# Patient Record
Sex: Male | Born: 1945
Health system: Southern US, Community
[De-identification: ages and names within clinical notes are randomized; demographics above are authoritative.]

## PROBLEM LIST (undated history)

## (undated) ENCOUNTER — Emergency Department (HOSPITAL_BASED_OUTPATIENT_CLINIC_OR_DEPARTMENT_OTHER): Admission: EM | Payer: BC Managed Care – PPO | Source: Home / Self Care

## (undated) DIAGNOSIS — Z9289 Personal history of other medical treatment: Secondary | ICD-10-CM

## (undated) DIAGNOSIS — R7303 Prediabetes: Secondary | ICD-10-CM

## (undated) DIAGNOSIS — M1990 Unspecified osteoarthritis, unspecified site: Secondary | ICD-10-CM

## (undated) DIAGNOSIS — K279 Peptic ulcer, site unspecified, unspecified as acute or chronic, without hemorrhage or perforation: Secondary | ICD-10-CM

## (undated) DIAGNOSIS — K219 Gastro-esophageal reflux disease without esophagitis: Secondary | ICD-10-CM

## (undated) DIAGNOSIS — I10 Essential (primary) hypertension: Secondary | ICD-10-CM

## (undated) DIAGNOSIS — J339 Nasal polyp, unspecified: Secondary | ICD-10-CM

## (undated) HISTORY — PX: COLONOSCOPY: SHX174

## (undated) HISTORY — PX: NASAL SINUS SURGERY: SHX719

## (undated) HISTORY — PX: STOMACH SURGERY: SHX791

## (undated) HISTORY — DX: Essential (primary) hypertension: I10

## (undated) HISTORY — PX: JOINT REPLACEMENT: SHX530

---

## 2012-07-07 ENCOUNTER — Encounter (HOSPITAL_BASED_OUTPATIENT_CLINIC_OR_DEPARTMENT_OTHER): Payer: Self-pay | Admitting: *Deleted

## 2012-07-07 NOTE — ED Notes (Signed)
Pt c/o diffuse abd pain with n/v x 5 days

## 2013-04-10 NOTE — Progress Notes (Signed)
Need orders in EPIC.  Surgery scheduled for 04/27/13.  Preop on 10/8 at 1130am Thank You.

## 2013-04-13 ENCOUNTER — Other Ambulatory Visit: Payer: Self-pay | Admitting: Orthopedic Surgery

## 2013-04-22 ENCOUNTER — Ambulatory Visit (HOSPITAL_COMMUNITY)
Admission: RE | Admit: 2013-04-22 | Discharge: 2013-04-22 | Disposition: A | Discharge status: Home / Self Care | Payer: BC Managed Care – PPO | Source: Ambulatory Visit | Attending: Orthopedic Surgery | Admitting: Orthopedic Surgery

## 2013-04-22 ENCOUNTER — Encounter (HOSPITAL_COMMUNITY): Payer: Self-pay | Admitting: Pharmacy Technician

## 2013-04-22 ENCOUNTER — Encounter (HOSPITAL_COMMUNITY)
Admission: RE | Admit: 2013-04-22 | Discharge: 2013-04-22 | Disposition: A | Discharge status: Home / Self Care | Payer: BC Managed Care – PPO | Source: Ambulatory Visit | Attending: Orthopedic Surgery | Admitting: Orthopedic Surgery

## 2013-04-22 ENCOUNTER — Encounter (HOSPITAL_COMMUNITY): Payer: Self-pay

## 2013-04-22 DIAGNOSIS — I10 Essential (primary) hypertension: Secondary | ICD-10-CM | POA: Insufficient documentation

## 2013-04-22 DIAGNOSIS — Z9289 Personal history of other medical treatment: Secondary | ICD-10-CM

## 2013-04-22 DIAGNOSIS — Z01812 Encounter for preprocedural laboratory examination: Secondary | ICD-10-CM | POA: Insufficient documentation

## 2013-04-22 DIAGNOSIS — Z01818 Encounter for other preprocedural examination: Secondary | ICD-10-CM | POA: Insufficient documentation

## 2013-04-22 DIAGNOSIS — M1710 Unilateral primary osteoarthritis, unspecified knee: Secondary | ICD-10-CM | POA: Insufficient documentation

## 2013-04-22 HISTORY — DX: Gastro-esophageal reflux disease without esophagitis: K21.9

## 2013-04-22 HISTORY — DX: Unspecified osteoarthritis, unspecified site: M19.90

## 2013-04-22 HISTORY — DX: Peptic ulcer, site unspecified, unspecified as acute or chronic, without hemorrhage or perforation: K27.9

## 2013-04-22 HISTORY — DX: Personal history of other medical treatment: Z92.89

## 2013-04-22 HISTORY — PX: CATARACT EXTRACTION: SUR2

## 2013-04-22 LAB — COMPREHENSIVE METABOLIC PANEL
AST: 20 U/L (ref 0–37)
Alkaline Phosphatase: 109 U/L (ref 39–117)
BUN: 9 mg/dL (ref 6–23)
CO2: 29 mEq/L (ref 19–32)
Calcium: 9.6 mg/dL (ref 8.4–10.5)
Chloride: 106 mEq/L (ref 96–112)
Creatinine, Ser: 0.87 mg/dL (ref 0.50–1.35)
GFR calc Af Amer: >90 mL/min (ref >90)
GFR calc non Af Amer: 87 mL/min — ABNORMAL LOW (ref >90)
Glucose, Bld: 91 mg/dL (ref 70–99)
Total Bilirubin: 0.7 mg/dL (ref 0.3–1.2)
Total Protein: 7.5 g/dL (ref 6.0–8.3)

## 2013-04-22 LAB — CBC
HCT: 43.5 % (ref 39.0–52.0)
Hemoglobin: 15.3 g/dL (ref 13.0–17.0)
MCHC: 35.2 g/dL (ref 30.0–36.0)
MCV: 96 fL (ref 78.0–100.0)
Platelets: 158 10*3/uL (ref 150–400)
RBC: 4.53 MIL/uL (ref 4.22–5.81)
WBC: 6.2 10*3/uL (ref 4.0–10.5)

## 2013-04-22 LAB — URINALYSIS, ROUTINE W REFLEX MICROSCOPIC
Glucose, UA: NEGATIVE mg/dL
Ketones, ur: NEGATIVE mg/dL
Leukocytes, UA: NEGATIVE
Urobilinogen, UA: 1 mg/dL (ref 0.0–1.0)
pH: 8 (ref 5.0–8.0)

## 2013-04-22 LAB — SURGICAL PCR SCREEN
MRSA, PCR: NEGATIVE
Staphylococcus aureus: POSITIVE — AB

## 2013-04-22 LAB — ABO/RH: ABO/RH(D): O POS

## 2013-04-22 LAB — PROTIME-INR: INR: 0.97 (ref 0.00–1.49)

## 2013-04-22 NOTE — Patient Instructions (Addendum)
[address] Tor Lonna Cobb  04/22/2013   Your procedure is scheduled on:   04-27-2013  Report to [facility] at      1200 noon..  Call this number if you have problems the morning of surgery: [phone]  Or Presurgical Testing [phone](Lorain Fettes)      Do not eat food:After Midnight.  May have clear liquids:up to 6 Hours before arrival. Nothing after : 0900 AM  Clear liquids include soda, tea, black coffee, apple or grape juice, broth.  Take these medicines the morning of surgery with A SIP OF WATER: Metoprolol. Omepraole. Tramadol   Do not wear jewelry, make-up or nail polish.  Do not wear lotions, powders, or perfumes. You may wear deodorant.  Do not shave 12 hours prior to first CHG shower(legs and under arms).(face and neck okay.)  Do not bring valuables to the hospital.  Contacts, dentures or bridgework,body piercing,  may not be worn into surgery.  Leave suitcase in the car. After surgery it may be brought to your room.  For patients admitted to the hospital, checkout time is 11:00 AM the day of discharge.   Patients discharged the day of surgery will not be allowed to drive home. Must have responsible person with you x 24 hours once discharged.  Name and phone number of your driver: Maryjane Hurter.spouse [phone] home  Special Instructions: CHG(Chlorhedine 4%-"Hibiclens","Betasept","Aplicare") Shower Use Special Wash: see special instructions.(avoid face and genitals)   Please read over the following fact sheets that you were given: MRSA Information, Blood Transfusion fact sheet, Incentive Spirometry Instruction.    Failure to follow these instructions may result in Cancellation of your surgery.   Patient signature_______________________________________________________

## 2013-04-22 NOTE — Pre-Procedure Instructions (Addendum)
04-22-13 EKG requested,pending fax copy from PCP. CXR done today. Wife-Mary Romero,acting interpreter today and day of surgery. Pt. Speaks and understands most English. 04-22-13 1630 EKG received dated 04-15-13 -report with chart. Cardiology note 04-20-13 with chart. W.Caeli Linehan,RN 04-23-13 1600 Pt's wife [name] aware of Positive PCR screen will need Mupirocin Oint.W. Kennon Portela

## 2013-04-23 NOTE — Progress Notes (Signed)
04-23-13 1600 Pt's spouse [name] notified of Positive Staph aureus PCR screen -pt. Will need Mupirocin Oint called to [facility]. Main [place] [phone].

## 2013-04-26 ENCOUNTER — Other Ambulatory Visit: Payer: Self-pay | Admitting: Orthopedic Surgery

## 2013-04-26 NOTE — H&P (Signed)
Zachary Lyons  DOB: 1945/09/25 Undefined / Language: Undefined / Race: Refused to Report/Unreported Male  Date of Admission:  04/27/2013  Chief Complaint:  Left Knee Pain  History of Present Illness The patient is a 67 year old male who comes in for a preoperative History and Physical. The patient is scheduled for a left total knee arthroplasty to be performed by Dr. Gus Lyons. Aluisio, MD at [facility] on 04/27/2013. The patient is a 67 year old male who presents with knee complaints. The patient was seen for a second opinion. The patient reports left knee (worse than right. He has not had any prior treatment for the right knee) symptoms including: pain which began 4 year(s) ago without any known injury.The patient feels that the symptoms are worsening. The patient has the current diagnosis of knee osteoarthritis. Prior to being seen today the patient was previously evaluated by a colleague. Previous work-up for this problem has included knee x-rays, knee MRI and arthroscopy (07/18/09- left knee arthroscopy with debridement of articular cartilage and medial meniscectomy, by Dr. Darnell Lyons). Current treatment includes knee brace (knee sleeves) and non-opioid analgesics (Tylenol. He is unable to take NSAIDs due to a history of stomach ulcers). Mr. Zachary Lyons has been treated by Dr. Christell Lyons at [facility] in [facility]. He has had a knee arthroscopy and had cortisone injections. Unfortunately, his knee is getting progressively worse over time. He is having pain mainly along the medial aspect of the left knee. He does get swelling at times. The knee gives out on him. He works 12-14 hours a day standing on a hard surface. By the end of the day everything gets worse. He is not currently having any right knee problem. He is ready to proceed with the left knee surgery. They have been treated conservatively in the past for the above stated problem and despite conservative measures,  they continue to have progressive pain and severe functional limitations and dysfunction. They have failed non-operative management including home exercise, medications, and injections. It is felt that they would benefit from undergoing total joint replacement. Risks and benefits of the procedure have been discussed with the patient and they elect to proceed with surgery. There are no active contraindications to surgery such as ongoing infection or rapidly progressive neurological disease.   Problem List Primary osteoarthritis of one knee (715.16)  Allergies No Known Drug Allergies. 01/30/2013  Family History Father. Deceased. age 94 Mother. Deceased. age 45  Social History Tobacco use. Never smoker. Alcohol use. Currently drinks alcohol. 2-3 drinks a day Marital status. Married. Children. 10 Living situation. Lives with spouse. Post-Surgical Plans. Plan is to go to rehab versus home.  Medication History Metoprolol Succinate ER (200MG  Tablet ER 24HR, Oral) Active. Lisinopril-Hydrochlorothiazide (20-25MG  Tablet, Oral) Active. Vitamin D (50000UNIT Capsule, Oral) Active. MiraLax ( Oral) Active. Centrum Silver Ultra Mens ( Oral) Active. Simvastatin ( Oral) Specific dose unknown - Active. TraMADol HCl (50MG  Tablet, Oral) Active. Tylenol Extra Strength (500MG  Tablet, Oral) Active.   Past Surgical History Sinus Surgery Colonoscopy. Twice EGD. Twice   Medical History Hypertension Gastric Ulcer. Peptic Ulcer Helicobacter pylori (H. pylori) infection (041.86) Hyperlipidemia Erectile dysfunction (607.84) Osteopenia (733.90) Seborrheic dermatitis of scalp (690.18) Vitamin D deficiency (268.9) Fatigue (780.79) Dizziness (780.4) GI bleed due to NSAIDs (578.9)   Review of Systems General:Not Present- Chills, Fever, Night Sweats, Fatigue, Weight Gain, Weight Loss and Memory Loss. Skin:Not Present- Hives, Itching, Rash, Eczema and Lesions. HEENT:Not  Present- Tinnitus, Headache, Double Vision, Visual Loss,  Hearing Loss and Dentures. Respiratory:Not Present- Shortness of breath with exertion, Shortness of breath at rest, Allergies, Coughing up blood and Chronic Cough. Cardiovascular:Not Present- Chest Pain, Racing/skipping heartbeats, Difficulty Breathing Lying Down, Murmur, Swelling and Palpitations. Gastrointestinal:Not Present- Bloody Stool, Heartburn, Abdominal Pain, Vomiting, Nausea, Constipation, Diarrhea, Difficulty Swallowing, Jaundice and Loss of appetitie. Male Genitourinary:Not Present- Urinary frequency, Blood in Urine, Weak urinary stream, Discharge, Flank Pain, Incontinence, Painful Urination, Urgency, Urinary Retention and Urinating at Night. Musculoskeletal:Present- Joint Pain. Not Present- Muscle Weakness, Muscle Pain, Joint Swelling, Back Pain, Morning Stiffness and Spasms. Neurological:Not Present- Tremor, Dizziness, Blackout spells, Paralysis, Difficulty with balance and Weakness. Psychiatric:Not Present- Insomnia.    Vitals Weight: 154 lb Height: 63 in Weight was reported by patient. Height was reported by patient. Body Surface Area: 1.76 m Body Mass Index: 27.28 kg/m BP: 148/94 (Sitting, Right Arm, Standard)     Physical Exam The physical exam findings are as follows:   General Mental Status - Alert, cooperative and good historian. General Appearance- pleasant. Not in acute distress. Orientation- Oriented X3. Build & Nutrition- Well nourished and Well developed.   Head and Neck Head- normocephalic, atraumatic . Neck Global Assessment- supple. no bruit auscultated on the right and no bruit auscultated on the left.   Eye Vision- Wears corrective lenses (readers). Pupil- Bilateral- Regular and Round. Motion- Bilateral- EOMI.   Chest and Lung Exam Auscultation: Breath sounds:- clear at anterior chest wall and - clear at posterior chest wall. Adventitious sounds:-  No Adventitious sounds.   Cardiovascular Auscultation:Rhythm- Regular rate and rhythm. Heart Sounds- S1 WNL and S2 WNL. Murmurs & Other Heart Sounds:Auscultation of the heart reveals - No Murmurs.   Abdomen Palpation/Percussion:Tenderness- Abdomen is non-tender to palpation. Rigidity (guarding)- Abdomen is soft. Auscultation:Auscultation of the abdomen reveals - Bowel sounds normal.   Male Genitourinary Not done, not pertinent to present illness  Musculoskeletal On examination he is alert and oriented. No apparent distress. His hips show a normal range of motion. No discomfort. The left knee varus deformity range is about 10 to 120. Marked crepitus on range of motion. There is tenderness, medial greater than lateral. No instability. The right knee shows no effusion. Slight varus. Range is 5-130. Mild crepitus on range of motion. No instability. Radiographs, AP of both knees and lateral show bone on bone arthritis in the medial patellofemoral compartments, both knees, left worse than right.   Assessment & Plan Primary osteoarthritis of one knee (715.16) Impression: Left Knee  Note: Plan is for a Left Total Knee Replacement by Dr. Lequita Halt.  Plan is to go home versus rehab following the hospital stay.  PCP - Dr. Liborio Nixon  The patient does not have any contraindications and will receive TXA (tranexamic acid) prior to surgery.  Signed electronically by Lauraine Rinne, III PA-C

## 2013-04-27 ENCOUNTER — Inpatient Hospital Stay (HOSPITAL_COMMUNITY)
Admission: RE | Admit: 2013-04-27 | Discharge: 2013-04-29 | DRG: 209 | Disposition: A | Discharge status: Home Health | Payer: BC Managed Care – PPO | Source: Ambulatory Visit | Attending: Orthopedic Surgery | Admitting: Orthopedic Surgery

## 2013-04-27 ENCOUNTER — Encounter (HOSPITAL_COMMUNITY): Payer: BC Managed Care – PPO | Admitting: Certified Registered Nurse Anesthetist

## 2013-04-27 ENCOUNTER — Encounter (HOSPITAL_COMMUNITY): Payer: Self-pay | Admitting: *Deleted

## 2013-04-27 ENCOUNTER — Encounter (HOSPITAL_COMMUNITY)
Admission: RE | Disposition: A | Discharge status: Home Health | Payer: Self-pay | Source: Ambulatory Visit | Attending: Orthopedic Surgery

## 2013-04-27 ENCOUNTER — Inpatient Hospital Stay (HOSPITAL_COMMUNITY): Payer: BC Managed Care – PPO | Admitting: Certified Registered Nurse Anesthetist

## 2013-04-27 DIAGNOSIS — I10 Essential (primary) hypertension: Secondary | ICD-10-CM | POA: Diagnosis present

## 2013-04-27 DIAGNOSIS — Z79899 Other long term (current) drug therapy: Secondary | ICD-10-CM

## 2013-04-27 DIAGNOSIS — M1710 Unilateral primary osteoarthritis, unspecified knee: Principal | ICD-10-CM | POA: Diagnosis present

## 2013-04-27 DIAGNOSIS — Z8711 Personal history of peptic ulcer disease: Secondary | ICD-10-CM

## 2013-04-27 DIAGNOSIS — K219 Gastro-esophageal reflux disease without esophagitis: Secondary | ICD-10-CM | POA: Diagnosis present

## 2013-04-27 DIAGNOSIS — M899 Disorder of bone, unspecified: Secondary | ICD-10-CM | POA: Diagnosis present

## 2013-04-27 DIAGNOSIS — E785 Hyperlipidemia, unspecified: Secondary | ICD-10-CM | POA: Diagnosis present

## 2013-04-27 DIAGNOSIS — M179 Osteoarthritis of knee, unspecified: Secondary | ICD-10-CM | POA: Diagnosis present

## 2013-04-27 DIAGNOSIS — Z96652 Presence of left artificial knee joint: Secondary | ICD-10-CM

## 2013-04-27 HISTORY — PX: TOTAL KNEE ARTHROPLASTY: SHX125

## 2013-04-27 LAB — TYPE AND SCREEN: ABO/RH(D): O POS

## 2013-04-27 SURGERY — ARTHROPLASTY, KNEE, TOTAL
Anesthesia: Spinal | Site: Knee | Laterality: Left | Wound class: Clean

## 2013-04-27 MED ORDER — BUPIVACAINE IN DEXTROSE 0.75-8.25 % IT SOLN
INTRATHECAL | Status: DC | PRN
  Administered 2013-04-27: 2 mL via INTRATHECAL

## 2013-04-27 MED ORDER — FLEET ENEMA 7-19 GM/118ML RE ENEM: 1.0000 | ENEMA | Freq: Once | RECTAL | Status: AC | PRN

## 2013-04-27 MED ORDER — CEFAZOLIN SODIUM-DEXTROSE 2-3 GM-% IV SOLR
INTRAVENOUS | Status: AC
  Filled 2013-04-27: qty 50

## 2013-04-27 MED ORDER — TRAMADOL HCL 50 MG PO TABS: 50.0000 mg | ORAL_TABLET | Freq: Four times a day (QID) | ORAL | Status: DC | PRN

## 2013-04-27 MED ORDER — SODIUM CHLORIDE 0.9 % IR SOLN
Status: DC | PRN
  Administered 2013-04-27: 1000 mL

## 2013-04-27 MED ORDER — DIPHENHYDRAMINE HCL 12.5 MG/5ML PO ELIX: 12.5000 mg | ORAL_SOLUTION | ORAL | Status: DC | PRN

## 2013-04-27 MED ORDER — CHLORHEXIDINE GLUCONATE 4 % EX LIQD: 60.0000 mL | Freq: Once | CUTANEOUS | Status: DC

## 2013-04-27 MED ORDER — ONDANSETRON HCL 4 MG/2ML IJ SOLN: 4.0000 mg | Freq: Four times a day (QID) | INTRAMUSCULAR | Status: DC | PRN

## 2013-04-27 MED ORDER — TRANEXAMIC ACID 100 MG/ML IV SOLN
1000.0000 mg | INTRAVENOUS | Status: AC
  Administered 2013-04-27: 1000 mg via INTRAVENOUS
  Filled 2013-04-27: qty 10

## 2013-04-27 MED ORDER — LACTATED RINGERS IV SOLN
INTRAVENOUS | Status: DC | PRN
  Administered 2013-04-27 (×2): via INTRAVENOUS

## 2013-04-27 MED ORDER — DEXAMETHASONE SODIUM PHOSPHATE 10 MG/ML IJ SOLN
10.0000 mg | Freq: Every day | INTRAMUSCULAR | Status: AC
  Filled 2013-04-27: qty 1

## 2013-04-27 MED ORDER — PROPOFOL INFUSION 10 MG/ML OPTIME
INTRAVENOUS | Status: DC | PRN
  Administered 2013-04-27: 80 ug/kg/min via INTRAVENOUS

## 2013-04-27 MED ORDER — CEFAZOLIN SODIUM-DEXTROSE 2-3 GM-% IV SOLR
2.0000 g | INTRAVENOUS | Status: AC
  Administered 2013-04-27: 2 g via INTRAVENOUS

## 2013-04-27 MED ORDER — MUPIROCIN 2 % EX OINT
TOPICAL_OINTMENT | CUTANEOUS | Status: AC
  Administered 2013-04-27: 1
  Filled 2013-04-27: qty 22

## 2013-04-27 MED ORDER — DEXAMETHASONE 6 MG PO TABS
10.0000 mg | ORAL_TABLET | Freq: Every day | ORAL | Status: AC
  Administered 2013-04-28: 10:00:00 10 mg via ORAL
  Filled 2013-04-27: qty 1

## 2013-04-27 MED ORDER — BISACODYL 10 MG RE SUPP: 10.0000 mg | Freq: Every day | RECTAL | Status: DC | PRN

## 2013-04-27 MED ORDER — SODIUM CHLORIDE 0.9 % IJ SOLN
INTRAMUSCULAR | Status: AC
  Filled 2013-04-27: qty 50

## 2013-04-27 MED ORDER — SODIUM CHLORIDE 0.9 % IJ SOLN
INTRAMUSCULAR | Status: DC | PRN
  Administered 2013-04-27: 30 mL via INTRAVENOUS

## 2013-04-27 MED ORDER — ACETAMINOPHEN 500 MG PO TABS
1000.0000 mg | ORAL_TABLET | Freq: Once | ORAL | Status: AC
  Administered 2013-04-27: 1000 mg via ORAL
  Filled 2013-04-27: qty 2

## 2013-04-27 MED ORDER — KETOROLAC TROMETHAMINE 15 MG/ML IJ SOLN: 7.5000 mg | Freq: Four times a day (QID) | INTRAMUSCULAR | Status: AC | PRN

## 2013-04-27 MED ORDER — BUPIVACAINE HCL (PF) 0.25 % IJ SOLN
INTRAMUSCULAR | Status: AC
  Filled 2013-04-27: qty 30

## 2013-04-27 MED ORDER — PHENOL 1.4 % MT LIQD: 1.0000 | OROMUCOSAL | Status: DC | PRN

## 2013-04-27 MED ORDER — ONDANSETRON HCL 4 MG PO TABS: 4.0000 mg | ORAL_TABLET | Freq: Four times a day (QID) | ORAL | Status: DC | PRN

## 2013-04-27 MED ORDER — LOSARTAN POTASSIUM 50 MG PO TABS
50.0000 mg | ORAL_TABLET | Freq: Two times a day (BID) | ORAL | Status: DC
  Administered 2013-04-27 – 2013-04-29 (×4): 50 mg via ORAL
  Filled 2013-04-27 (×5): qty 1

## 2013-04-27 MED ORDER — LACTATED RINGERS IV SOLN: INTRAVENOUS | Status: DC

## 2013-04-27 MED ORDER — MENTHOL 3 MG MT LOZG
1.0000 | LOZENGE | OROMUCOSAL | Status: DC | PRN
  Filled 2013-04-27: qty 9

## 2013-04-27 MED ORDER — HYDROMORPHONE HCL PF 1 MG/ML IJ SOLN: 0.2500 mg | INTRAMUSCULAR | Status: DC | PRN

## 2013-04-27 MED ORDER — PROMETHAZINE HCL 25 MG/ML IJ SOLN: 6.2500 mg | INTRAMUSCULAR | Status: DC | PRN

## 2013-04-27 MED ORDER — POLYETHYLENE GLYCOL 3350 17 G PO PACK: 17.0000 g | PACK | Freq: Every day | ORAL | Status: DC | PRN

## 2013-04-27 MED ORDER — ONDANSETRON HCL 4 MG/2ML IJ SOLN
INTRAMUSCULAR | Status: DC | PRN
  Administered 2013-04-27 (×2): 2 mg via INTRAMUSCULAR

## 2013-04-27 MED ORDER — ACETAMINOPHEN 500 MG PO TABS
1000.0000 mg | ORAL_TABLET | Freq: Four times a day (QID) | ORAL | Status: AC
  Administered 2013-04-27 – 2013-04-28 (×3): 1000 mg via ORAL
  Filled 2013-04-27 (×4): qty 2

## 2013-04-27 MED ORDER — BUPIVACAINE LIPOSOME 1.3 % IJ SUSP
20.0000 mL | Freq: Once | INTRAMUSCULAR | Status: DC
  Filled 2013-04-27: qty 20

## 2013-04-27 MED ORDER — BUPIVACAINE HCL 0.25 % IJ SOLN
INTRAMUSCULAR | Status: DC | PRN
  Administered 2013-04-27: 20 mL

## 2013-04-27 MED ORDER — METOPROLOL SUCCINATE ER 100 MG PO TB24
300.0000 mg | ORAL_TABLET | Freq: Every morning | ORAL | Status: DC
  Administered 2013-04-28 – 2013-04-29 (×2): 300 mg via ORAL
  Filled 2013-04-27 (×3): qty 3

## 2013-04-27 MED ORDER — METOCLOPRAMIDE HCL 5 MG/ML IJ SOLN: 5.0000 mg | Freq: Three times a day (TID) | INTRAMUSCULAR | Status: DC | PRN

## 2013-04-27 MED ORDER — RIVAROXABAN 10 MG PO TABS
10.0000 mg | ORAL_TABLET | Freq: Every day | ORAL | Status: DC
  Administered 2013-04-28 – 2013-04-29 (×2): 10 mg via ORAL
  Filled 2013-04-27 (×3): qty 1

## 2013-04-27 MED ORDER — SODIUM CHLORIDE 0.9 % IV SOLN: INTRAVENOUS | Status: DC

## 2013-04-27 MED ORDER — DEXAMETHASONE SODIUM PHOSPHATE 10 MG/ML IJ SOLN: 10.0000 mg | Freq: Once | INTRAMUSCULAR | Status: DC

## 2013-04-27 MED ORDER — MUPIROCIN CALCIUM 2 % EX CREA
TOPICAL_CREAM | Freq: Two times a day (BID) | CUTANEOUS | Status: DC
  Filled 2013-04-27: qty 15

## 2013-04-27 MED ORDER — OXYCODONE HCL 5 MG PO TABS
5.0000 mg | ORAL_TABLET | ORAL | Status: DC | PRN
  Administered 2013-04-27 – 2013-04-28 (×2): 10 mg via ORAL
  Administered 2013-04-28: 5 mg via ORAL
  Administered 2013-04-28 – 2013-04-29 (×4): 10 mg via ORAL
  Filled 2013-04-27 (×2): qty 2
  Filled 2013-04-27: qty 1
  Filled 2013-04-27 (×4): qty 2

## 2013-04-27 MED ORDER — PANTOPRAZOLE SODIUM 40 MG PO TBEC
80.0000 mg | DELAYED_RELEASE_TABLET | Freq: Every day | ORAL | Status: DC
  Filled 2013-04-27: qty 2

## 2013-04-27 MED ORDER — DOCUSATE SODIUM 100 MG PO CAPS
100.0000 mg | ORAL_CAPSULE | Freq: Two times a day (BID) | ORAL | Status: DC
  Administered 2013-04-27 – 2013-04-29 (×4): 100 mg via ORAL

## 2013-04-27 MED ORDER — POTASSIUM CHLORIDE IN NACL 20-0.45 MEQ/L-% IV SOLN
INTRAVENOUS | Status: DC
  Administered 2013-04-27 – 2013-04-29 (×3): via INTRAVENOUS
  Filled 2013-04-27 (×4): qty 1000

## 2013-04-27 MED ORDER — METHOCARBAMOL 500 MG PO TABS
500.0000 mg | ORAL_TABLET | Freq: Four times a day (QID) | ORAL | Status: DC | PRN
  Administered 2013-04-27 – 2013-04-29 (×4): 500 mg via ORAL
  Filled 2013-04-27 (×5): qty 1

## 2013-04-27 MED ORDER — MIDAZOLAM HCL 5 MG/5ML IJ SOLN
INTRAMUSCULAR | Status: DC | PRN
  Administered 2013-04-27 (×2): 1 mg via INTRAVENOUS

## 2013-04-27 MED ORDER — 0.9 % SODIUM CHLORIDE (POUR BTL) OPTIME
TOPICAL | Status: DC | PRN
  Administered 2013-04-27: 1000 mL

## 2013-04-27 MED ORDER — BUPIVACAINE LIPOSOME 1.3 % IJ SUSP
INTRAMUSCULAR | Status: DC | PRN
  Administered 2013-04-27: 20 mL

## 2013-04-27 MED ORDER — METHOCARBAMOL 100 MG/ML IJ SOLN
500.0000 mg | Freq: Four times a day (QID) | INTRAVENOUS | Status: DC | PRN
  Filled 2013-04-27: qty 5

## 2013-04-27 MED ORDER — SIMVASTATIN 20 MG PO TABS
20.0000 mg | ORAL_TABLET | Freq: Every day | ORAL | Status: DC
  Administered 2013-04-27 – 2013-04-28 (×2): 20 mg via ORAL
  Filled 2013-04-27 (×3): qty 1

## 2013-04-27 MED ORDER — MORPHINE SULFATE 2 MG/ML IJ SOLN
1.0000 mg | INTRAMUSCULAR | Status: DC | PRN
  Administered 2013-04-27 – 2013-04-28 (×3): 2 mg via INTRAVENOUS
  Filled 2013-04-27 (×3): qty 1

## 2013-04-27 MED ORDER — CEFAZOLIN SODIUM 1-5 GM-% IV SOLN
1.0000 g | Freq: Four times a day (QID) | INTRAVENOUS | Status: AC
  Administered 2013-04-27 – 2013-04-28 (×2): 1 g via INTRAVENOUS
  Filled 2013-04-27 (×2): qty 50

## 2013-04-27 MED ORDER — METOCLOPRAMIDE HCL 10 MG PO TABS: 5.0000 mg | ORAL_TABLET | Freq: Three times a day (TID) | ORAL | Status: DC | PRN

## 2013-04-27 SURGICAL SUPPLY — 61 items
BAG ZIPLOCK 12X15 (MISCELLANEOUS) ×2 IMPLANT
BANDAGE ELASTIC 6 VELCRO ST LF (GAUZE/BANDAGES/DRESSINGS) ×2 IMPLANT
BANDAGE ESMARK 6X9 LF (GAUZE/BANDAGES/DRESSINGS) ×1 IMPLANT
BLADE SAG 18X100X1.27 (BLADE) ×2 IMPLANT
BLADE SAW SGTL 11.0X1.19X90.0M (BLADE) ×2 IMPLANT
BNDG ESMARK 6X9 LF (GAUZE/BANDAGES/DRESSINGS) ×2
BOWL SMART MIX CTS (DISPOSABLE) ×2 IMPLANT
CAPT RP KNEE ×2 IMPLANT
CEMENT HV SMART SET (Cement) ×4 IMPLANT
CLOTH BEACON ORANGE TIMEOUT ST (SAFETY) IMPLANT
CUFF TOURN SGL QUICK 34 (TOURNIQUET CUFF) ×1
CUFF TRNQT CYL 34X4X40X1 (TOURNIQUET CUFF) ×1 IMPLANT
DECANTER SPIKE VIAL GLASS SM (MISCELLANEOUS) ×2 IMPLANT
DRAPE EXTREMITY T 121X128X90 (DRAPE) ×2 IMPLANT
DRAPE POUCH INSTRU U-SHP 10X18 (DRAPES) ×2 IMPLANT
DRAPE U-SHAPE 47X51 STRL (DRAPES) ×2 IMPLANT
DRSG ADAPTIC 3X8 NADH LF (GAUZE/BANDAGES/DRESSINGS) ×2 IMPLANT
DRSG PAD ABDOMINAL 8X10 ST (GAUZE/BANDAGES/DRESSINGS) ×2 IMPLANT
DURAPREP 26ML APPLICATOR (WOUND CARE) ×2 IMPLANT
ELECT REM PT RETURN 9FT ADLT (ELECTROSURGICAL) ×2
ELECTRODE REM PT RTRN 9FT ADLT (ELECTROSURGICAL) ×1 IMPLANT
EVACUATOR 1/8 PVC DRAIN (DRAIN) ×2 IMPLANT
FACESHIELD LNG OPTICON STERILE (SAFETY) ×10 IMPLANT
GLOVE BIO SURGEON STRL SZ7.5 (GLOVE) IMPLANT
GLOVE BIO SURGEON STRL SZ8 (GLOVE) ×2 IMPLANT
GLOVE BIOGEL PI IND STRL 7.0 (GLOVE) ×1 IMPLANT
GLOVE BIOGEL PI IND STRL 8 (GLOVE) ×2 IMPLANT
GLOVE BIOGEL PI INDICATOR 7.0 (GLOVE) ×1
GLOVE BIOGEL PI INDICATOR 8 (GLOVE) ×2
GLOVE SURG SS PI 6.5 STRL IVOR (GLOVE) ×2 IMPLANT
GLOVE SURG SS PI 8.5 STRL IVOR (GLOVE) ×2
GLOVE SURG SS PI 8.5 STRL STRW (GLOVE) ×2 IMPLANT
GOWN PREVENTION PLUS LG XLONG (DISPOSABLE) ×2 IMPLANT
GOWN STRL REIN 2XL XLG LVL4 (GOWN DISPOSABLE) ×2 IMPLANT
GOWN STRL REIN XL XLG (GOWN DISPOSABLE) ×4 IMPLANT
HANDPIECE INTERPULSE COAX TIP (DISPOSABLE) ×1
IMMOBILIZER KNEE 20 (SOFTGOODS) ×2
IMMOBILIZER KNEE 20 THIGH 36 (SOFTGOODS) ×1 IMPLANT
KIT BASIN OR (CUSTOM PROCEDURE TRAY) ×2 IMPLANT
MANIFOLD NEPTUNE II (INSTRUMENTS) ×2 IMPLANT
NDL SAFETY ECLIPSE 18X1.5 (NEEDLE) ×2 IMPLANT
NEEDLE HYPO 18GX1.5 SHARP (NEEDLE) ×2
NS IRRIG 1000ML POUR BTL (IV SOLUTION) ×2 IMPLANT
PACK TOTAL JOINT (CUSTOM PROCEDURE TRAY) ×2 IMPLANT
PADDING CAST COTTON 6X4 STRL (CAST SUPPLIES) ×2 IMPLANT
POSITIONER SURGICAL ARM (MISCELLANEOUS) ×2 IMPLANT
SET HNDPC FAN SPRY TIP SCT (DISPOSABLE) ×1 IMPLANT
SPONGE GAUZE 4X4 12PLY (GAUZE/BANDAGES/DRESSINGS) ×2 IMPLANT
STRIP CLOSURE SKIN 1/2X4 (GAUZE/BANDAGES/DRESSINGS) ×2 IMPLANT
SUCTION FRAZIER 12FR DISP (SUCTIONS) ×2 IMPLANT
SUT MNCRL AB 4-0 PS2 18 (SUTURE) ×2 IMPLANT
SUT VIC AB 2-0 CT1 27 (SUTURE) ×3
SUT VIC AB 2-0 CT1 TAPERPNT 27 (SUTURE) ×3 IMPLANT
SUT VLOC 180 0 24IN GS25 (SUTURE) ×2 IMPLANT
SYR 20CC LL (SYRINGE) ×2 IMPLANT
SYR 50ML LL SCALE MARK (SYRINGE) ×2 IMPLANT
TOWEL OR 17X26 10 PK STRL BLUE (TOWEL DISPOSABLE) ×4 IMPLANT
TRAY FOLEY CATH 14FRSI W/METER (CATHETERS) IMPLANT
TRAY FOLEY METER SIL LF 16FR (CATHETERS) ×2 IMPLANT
WATER STERILE IRR 1500ML POUR (IV SOLUTION) ×2 IMPLANT
WRAP KNEE MAXI GEL POST OP (GAUZE/BANDAGES/DRESSINGS) ×2 IMPLANT

## 2013-04-27 NOTE — Transfer of Care (Signed)
Immediate Anesthesia Transfer of Care Note  Patient: Zachary Lyons  Procedure(s) Performed: Procedure(s): LEFT TOTAL KNEE ARTHROPLASTY (Left)  Patient Location: PACU  Anesthesia Type:Spinal  Level of Consciousness: awake, alert , oriented and patient cooperative  Airway & Oxygen Therapy: Patient Spontanous Breathing and Patient connected to face mask oxygen  Post-op Assessment: Report given to PACU RN and Post -op Vital signs reviewed and stable  Post vital signs: Reviewed and stable  Complications: No apparent anesthesia complications

## 2013-04-27 NOTE — Anesthesia Procedure Notes (Signed)
Spinal  Patient location during procedure: OR Start time: 04/27/2013 3:05 PM End time: 04/27/2013 3:10 PM Staffing Anesthesiologist: Lucille Passy F Performed by: anesthesiologist  Preanesthetic Checklist Completed: patient identified, site marked, surgical consent, pre-op evaluation, timeout performed, IV checked, risks and benefits discussed and monitors and equipment checked Spinal Block Patient position: sitting Prep: Betadine Patient monitoring: heart rate, continuous pulse ox and blood pressure Injection technique: single-shot Needle Needle type: Spinocan  Needle gauge: 22 G Needle length: 9 cm Additional Notes Expiration date of kit checked and confirmed. Patient tolerated procedure well, without complications. Negative heme/paresthesia Lot [id number] DOE 06/2014

## 2013-04-27 NOTE — H&P (View-Only) (Signed)
Zachary Lyons  DOB: 09/10/1945 Undefined / Language: Undefined / Race: Refused to Report/Unreported Male  Date of Admission:  04/27/2013  Chief Complaint:  Left Knee Pain  History of Present Illness The patient is a 67 year old male who comes in for a preoperative History and Physical. The patient is scheduled for a left total knee arthroplasty to be performed by Dr. Frank V. Aluisio, MD at [facility] on 04/27/2013. The patient is a 67 year old male who presents with knee complaints. The patient was seen for a second opinion. The patient reports left knee (worse than right. He has not had any prior treatment for the right knee) symptoms including: pain which began 4 year(s) ago without any known injury.The patient feels that the symptoms are worsening. The patient has the current diagnosis of knee osteoarthritis. Prior to being seen today the patient was previously evaluated by a colleague. Previous work-up for this problem has included knee x-rays, knee MRI and arthroscopy (07/18/09- left knee arthroscopy with debridement of articular cartilage and medial meniscectomy, by Dr. Slade Moore). Current treatment includes knee brace (knee sleeves) and non-opioid analgesics (Tylenol. He is unable to take NSAIDs due to a history of stomach ulcers). [name] has been treated by Dr. Moore at [facility] in [place]. He has had a knee arthroscopy and had cortisone injections. Unfortunately, his knee is getting progressively worse over time. He is having pain mainly along the medial aspect of the left knee. He does get swelling at times. The knee gives out on him. He works 12-14 hours a day standing on a hard surface. By the end of the day everything gets worse. He is not currently having any right knee problem. He is ready to proceed with the left knee surgery. They have been treated conservatively in the past for the above stated problem and despite conservative measures,  they continue to have progressive pain and severe functional limitations and dysfunction. They have failed non-operative management including home exercise, medications, and injections. It is felt that they would benefit from undergoing total joint replacement. Risks and benefits of the procedure have been discussed with the patient and they elect to proceed with surgery. There are no active contraindications to surgery such as ongoing infection or rapidly progressive neurological disease.   Problem List Primary osteoarthritis of one knee (715.16)  Allergies No Known Drug Allergies. 01/30/2013  Family History Father. Deceased. age 87 Mother. Deceased. age 7  Social History Tobacco use. Never smoker. Alcohol use. Currently drinks alcohol. 2-3 drinks a day Marital status. Married. Children. 10 Living situation. Lives with spouse. Post-Surgical Plans. Plan is to go to rehab versus home.  Medication History Metoprolol Succinate ER (200MG Tablet ER 24HR, Oral) Active. Lisinopril-Hydrochlorothiazide (20-25MG Tablet, Oral) Active. Vitamin D (50000UNIT Capsule, Oral) Active. MiraLax ( Oral) Active. Centrum Silver Ultra Mens ( Oral) Active. Simvastatin ( Oral) Specific dose unknown - Active. TraMADol HCl (50MG Tablet, Oral) Active. Tylenol Extra Strength (500MG Tablet, Oral) Active.   Past Surgical History Sinus Surgery Colonoscopy. Twice EGD. Twice   Medical History Hypertension Gastric Ulcer. Peptic Ulcer Helicobacter pylori (H. pylori) infection (041.86) Hyperlipidemia Erectile dysfunction (607.84) Osteopenia (733.90) Seborrheic dermatitis of scalp (690.18) Vitamin D deficiency (268.9) Fatigue (780.79) Dizziness (780.4) GI bleed due to NSAIDs (578.9)   Review of Systems General:Not Present- Chills, Fever, Night Sweats, Fatigue, Weight Gain, Weight Loss and Memory Loss. Skin:Not Present- Hives, Itching, Rash, Eczema and Lesions. HEENT:Not  Present- Tinnitus, Headache, Double Vision, Visual Loss,   Hearing Loss and Dentures. Respiratory:Not Present- Shortness of breath with exertion, Shortness of breath at rest, Allergies, Coughing up blood and Chronic Cough. Cardiovascular:Not Present- Chest Pain, Racing/skipping heartbeats, Difficulty Breathing Lying Down, Murmur, Swelling and Palpitations. Gastrointestinal:Not Present- Bloody Stool, Heartburn, Abdominal Pain, Vomiting, Nausea, Constipation, Diarrhea, Difficulty Swallowing, Jaundice and Loss of appetitie. Male Genitourinary:Not Present- Urinary frequency, Blood in Urine, Weak urinary stream, Discharge, Flank Pain, Incontinence, Painful Urination, Urgency, Urinary Retention and Urinating at Night. Musculoskeletal:Present- Joint Pain. Not Present- Muscle Weakness, Muscle Pain, Joint Swelling, Back Pain, Morning Stiffness and Spasms. Neurological:Not Present- Tremor, Dizziness, Blackout spells, Paralysis, Difficulty with balance and Weakness. Psychiatric:Not Present- Insomnia.    Vitals Weight: 154 lb Height: 63 in Weight was reported by patient. Height was reported by patient. Body Surface Area: 1.76 m Body Mass Index: 27.28 kg/m BP: 148/94 (Sitting, Right Arm, Standard)     Physical Exam The physical exam findings are as follows:   General Mental Status - Alert, cooperative and good historian. General Appearance- pleasant. Not in acute distress. Orientation- Oriented X3. Build & Nutrition- Well nourished and Well developed.   Head and Neck Head- normocephalic, atraumatic . Neck Global Assessment- supple. no bruit auscultated on the right and no bruit auscultated on the left.   Eye Vision- Wears corrective lenses (readers). Pupil- Bilateral- Regular and Round. Motion- Bilateral- EOMI.   Chest and Lung Exam Auscultation: Breath sounds:- clear at anterior chest wall and - clear at posterior chest wall. Adventitious sounds:-  No Adventitious sounds.   Cardiovascular Auscultation:Rhythm- Regular rate and rhythm. Heart Sounds- S1 WNL and S2 WNL. Murmurs & Other Heart Sounds:Auscultation of the heart reveals - No Murmurs.   Abdomen Palpation/Percussion:Tenderness- Abdomen is non-tender to palpation. Rigidity (guarding)- Abdomen is soft. Auscultation:Auscultation of the abdomen reveals - Bowel sounds normal.   Male Genitourinary Not done, not pertinent to present illness  Musculoskeletal On examination he is alert and oriented. No apparent distress. His hips show a normal range of motion. No discomfort. The left knee varus deformity range is about 10 to 120. Marked crepitus on range of motion. There is tenderness, medial greater than lateral. No instability. The right knee shows no effusion. Slight varus. Range is 5-130. Mild crepitus on range of motion. No instability. Radiographs, AP of both knees and lateral show bone on bone arthritis in the medial patellofemoral compartments, both knees, left worse than right.   Assessment & Plan Primary osteoarthritis of one knee (715.16) Impression: Left Knee  Note: Plan is for a Left Total Knee Replacement by Dr. Aluisio.  Plan is to go home versus rehab following the hospital stay.  PCP - Dr. Aguir  The patient does not have any contraindications and will receive TXA (tranexamic acid) prior to surgery.  Signed electronically by Alexzandrew L Perkins, III PA-C 

## 2013-04-27 NOTE — Anesthesia Preprocedure Evaluation (Addendum)
Anesthesia Evaluation  Patient identified by MRN, date of birth, ID band Patient awake    Reviewed: Allergy & Precautions, H&P , NPO status , Patient's Chart, lab work & pertinent test results  Airway Mallampati: II TM Distance: >3 FB Neck ROM: Full    Dental  (+) Poor Dentition, Missing and Dental Advisory Given   Pulmonary neg pulmonary ROS, former smoker,  breath sounds clear to auscultation  Pulmonary exam normal       Cardiovascular hypertension, Pt. on home beta blockers and Pt. on medications Rhythm:Regular Rate:Normal     Neuro/Psych negative neurological ROS  negative psych ROS   GI/Hepatic Neg liver ROS, PUD, GERD-  Medicated,  Endo/Other  negative endocrine ROS  Renal/GU negative Renal ROS  negative genitourinary   Musculoskeletal negative musculoskeletal ROS (+)   Abdominal   Peds  Hematology negative hematology ROS (+)   Anesthesia Other Findings   Reproductive/Obstetrics                          Anesthesia Physical Anesthesia Plan  ASA: II  Anesthesia Plan: Spinal   Post-op Pain Management:    Induction: Intravenous  Airway Management Planned: Simple Face Mask  Additional Equipment:   Intra-op Plan:   Post-operative Plan:   Informed Consent: I have reviewed the patients History and Physical, chart, labs and discussed the procedure including the risks, benefits and alternatives for the proposed anesthesia with the patient or authorized representative who has indicated his/her understanding and acceptance.   Dental advisory given  Plan Discussed with: CRNA  Anesthesia Plan Comments:         Anesthesia Quick Evaluation

## 2013-04-27 NOTE — Preoperative (Signed)
Beta Blockers   Reason not to administer Beta Blockers:Not Applicable 

## 2013-04-27 NOTE — Interval H&P Note (Signed)
History and Physical Interval Note:  04/27/2013 2:10 PM  Zachary Lyons  has presented today for surgery, with the diagnosis of Left Knee Osteoarthritis  The various methods of treatment have been discussed with the patient and family. After consideration of risks, benefits and other options for treatment, the patient has consented to  Procedure(s): LEFT TOTAL KNEE ARTHROPLASTY (Left) as a surgical intervention .  The patient's history has been reviewed, patient examined, no change in status, stable for surgery.  I have reviewed the patient's chart and labs.  Questions were answered to the patient's satisfaction.     Loanne Drilling

## 2013-04-27 NOTE — Plan of Care (Signed)
Problem: Consults Goal: Diagnosis- Total Joint Replacement Primary Total Knee     

## 2013-04-27 NOTE — Anesthesia Postprocedure Evaluation (Signed)
Anesthesia Post Note  Patient: Zachary Lyons  Procedure(s) Performed: Procedure(s) (LRB): LEFT TOTAL KNEE ARTHROPLASTY (Left)  Anesthesia type: Spinal  Patient location: PACU  Post pain: Pain level controlled  Post assessment: Post-op Vital signs reviewed  Last Vitals:  Filed Vitals:   04/27/13 1800  BP: 184/82  Pulse: 43  Temp: 36.3 C  Resp: 11    Post vital signs: Reviewed  Level of consciousness: sedated  Complications: No apparent anesthesia complications

## 2013-04-27 NOTE — Op Note (Signed)
Pre-operative diagnosis- Osteoarthritis  Left knee(s)  Post-operative diagnosis- Osteoarthritis Left knee(s)  Procedure-  Left  Total Knee Arthroplasty  Surgeon- Gus Rankin. Tangee Marszalek, MD  Assistant- Avel Peace, PA-C   Anesthesia-  Spinal EBL-* No blood loss amount entered *  Drains Hemovac  Tourniquet time-  Total Tourniquet Time Documented: Thigh (Left) - 35 minutes Total: Thigh (Left) - 35 minutes    Complications- None  Condition-PACU - hemodynamically stable.   Brief Clinical Note   Zachary Lyons is a 67 y.o. year old male with end stage OA of his left knee with progressively worsening pain and dysfunction. He has constant pain, with activity and at rest and significant functional deficits with difficulties even with ADLs. He has had extensive non-op management including analgesics, injections of cortisone, and home exercise program, but remains in significant pain with significant dysfunction. Radiographs show bone on bone arthritis medial and patellofemoral with varus deformity. He presents now for left Total Knee Arthroplasty.    Procedure in detail---   The patient is brought into the operating room and positioned supine on the operating table. After successful administration of  Spinal,   a tourniquet is placed high on the  Left thigh(s) and the lower extremity is prepped and draped in the usual sterile fashion. Time out is performed by the operating team and then the  Left lower extremity is wrapped in Esmarch, knee flexed and the tourniquet inflated to 300 mmHg.       A midline incision is made with a ten blade through the subcutaneous tissue to the level of the extensor mechanism. A fresh blade is used to make a medial parapatellar arthrotomy. Soft tissue over the proximal medial tibia is subperiosteally elevated to the joint line with a knife and into the semimembranosus bursa with a Lyons elevator. Soft tissue over the proximal lateral tibia is elevated with attention being paid  to avoiding the patellar tendon on the tibial tubercle. The patella is everted, knee flexed 90 degrees and the ACL and PCL are removed. Findings are bone on bone medial and patellofemoral with large medial osteophytes.        The drill is used to create a starting hole in the distal femur and the canal is thoroughly irrigated with sterile saline to remove the fatty contents. The 5 degree Left  valgus alignment guide is placed into the femoral canal and the distal femoral cutting block is pinned to remove 10 mm off the distal femur. Resection is made with an oscillating saw.      The tibia is subluxed forward and the menisci are removed. The extramedullary alignment guide is placed referencing proximally at the medial aspect of the tibial tubercle and distally along the second metatarsal axis and tibial crest. The block is pinned to remove 2mm off the more deficient medial  side. Resection is made with an oscillating saw. Size 3 is the most appropriate size for the tibia and the proximal tibia is prepared with the modular drill and keel punch for that size.      The femoral sizing guide is placed and size 3 is most appropriate. Rotation is marked off the epicondylar axis and confirmed by creating a rectangular flexion gap at 90 degrees. The size 3 cutting block is pinned in this rotation and the anterior, posterior and chamfer cuts are made with the oscillating saw. The intercondylar block is then placed and that cut is made.      Trial size 3 tibial component, trial  size 3 posterior stabilized femur and a 15  mm posterior stabilized rotating platform insert trial is placed. Full extension is achieved with excellent varus/valgus and anterior/posterior balance throughout full range of motion. The patella is everted and thickness measured to be 24  mm. Free hand resection is taken to 14 mm, a 38 template is placed, lug holes are drilled, trial patella is placed, and it tracks normally. Osteophytes are removed off  the posterior femur with the trial in place. All trials are removed and the cut bone surfaces prepared with pulsatile lavage. Cement is mixed and once ready for implantation, the size 3 tibial implant, size  3 posterior stabilized femoral component, and the size 38 patella are cemented in place and the patella is held with the clamp. The trial insert is placed and the knee held in full extension. The Exparel (20 ml mixed with 30 ml saline) and .25% Bupivicaine, are injected into the extensor mechanism, posterior capsule, medial and lateral gutters and subcutaneous tissues.  All extruded cement is removed and once the cement is hard the permanent 15 mm posterior stabilized rotating platform insert is placed into the tibial tray.      The wound is copiously irrigated with saline solution and the extensor mechanism closed over a hemovac drain with #1 PDS suture. The tourniquet is released for a total tourniquet time of 35  minutes. Flexion against gravity is 140 degrees and the patella tracks normally. Subcutaneous tissue is closed with 2.0 vicryl and subcuticular with running 4.0 Monocryl. The incision is cleaned and dried and steri-strips and a bulky sterile dressing are applied. The limb is placed into a knee immobilizer and the patient is awakened and transported to recovery in stable condition.      Please note that a surgical assistant was a medical necessity for this procedure in order to perform it in a safe and expeditious manner. Surgical assistant was necessary to retract the ligaments and vital neurovascular structures to prevent injury to them and also necessary for proper positioning of the limb to allow for anatomic placement of the prosthesis.   Gus Rankin Vanecia Limpert, MD    04/27/2013, 4:20 PM

## 2013-04-28 ENCOUNTER — Encounter (HOSPITAL_COMMUNITY): Payer: Self-pay | Admitting: Orthopedic Surgery

## 2013-04-28 LAB — BASIC METABOLIC PANEL
Calcium: 8.9 mg/dL (ref 8.4–10.5)
Creatinine, Ser: 0.85 mg/dL (ref 0.50–1.35)
GFR calc Af Amer: >90 mL/min (ref >90)
Glucose, Bld: 129 mg/dL — ABNORMAL HIGH (ref 70–99)

## 2013-04-28 LAB — CBC
HCT: 38.2 % — ABNORMAL LOW (ref 39.0–52.0)
Hemoglobin: 13.3 g/dL (ref 13.0–17.0)
MCH: 33.3 pg (ref 26.0–34.0)
MCHC: 34.8 g/dL (ref 30.0–36.0)
MCV: 95.5 fL (ref 78.0–100.0)
Platelets: 156 10*3/uL (ref 150–400)
RDW: 12.2 % (ref 11.5–15.5)
WBC: 7.7 10*3/uL (ref 4.0–10.5)

## 2013-04-28 MED ORDER — NON FORMULARY: 40.0000 mg | Freq: Every day | Status: DC

## 2013-04-28 MED ORDER — OMEPRAZOLE 20 MG PO CPDR
40.0000 mg | DELAYED_RELEASE_CAPSULE | Freq: Every day | ORAL | Status: DC
  Administered 2013-04-28 – 2013-04-29 (×2): 40 mg via ORAL
  Filled 2013-04-28 (×2): qty 2

## 2013-04-28 NOTE — Progress Notes (Signed)
I have reviewed this note and agree with all findings. Kati Kazimierz Springborn, PT, DPT Pager: [phone]   

## 2013-04-28 NOTE — Progress Notes (Signed)
Utilization review completed.  

## 2013-04-28 NOTE — Evaluation (Signed)
I have reviewed this note and agree with all findings. Kati Osman Calzadilla, PT, DPT Pager: [phone]   

## 2013-04-28 NOTE — Evaluation (Signed)
Physical Therapy Evaluation Patient Details Name: Zachary Lyons MRN: [id number] DOB: June 19, 1946 Today's Date: 04/28/2013 Time: 4098-1191 PT Time Calculation (min): 27 min  PT Assessment / Plan / Recommendation History of Present Illness  Pt is a 68 y/o male s/p L TKA on 04/27/13.  Clinical Impression  Pt is s/p L TKA resulting in the deficits listed below (see PT Problem List). Pt able to ambulate 33' today with RW and min guard.  Pt and pt's daughter state family will be present 24/7 at home to provide pt with assist as needed.  Pt will benefit from skilled PT to increase their independence and safety with mobility to allow d/c home.    PT Assessment  Patient needs continued PT services    Follow Up Recommendations  Home health PT;Supervision/Assistance - 24 hour    Does the patient have the potential to tolerate intense rehabilitation      Barriers to Discharge        Equipment Recommendations  Rolling walker with 5" wheels    Recommendations for Other Services     Frequency Min 7X/week    Precautions / Restrictions Precautions Precautions: Fall Required Braces or Orthoses: Knee Immobilizer - Left Restrictions Weight Bearing Restrictions: No   Pertinent Vitals/Pain Pt reports 3/10 pain at rest with increase during ambulation but unable to rate. Pt positioned to comfort at end of session, with ice packs placed on L knee.      Mobility  Bed Mobility Bed Mobility: Supine to Sit;Sitting - Scoot to Edge of Bed Supine to Sit: 4: Min assist Sitting - Scoot to [place] of Bed: 4: Min guard Details for Bed Mobility Assistance: Min A to assist LLE off EOB due to pain and min guard during scooting EOB to ensure safety. PT educated pt to keep LLE extended while in supine in order to improve LLE knee ext. ROM. Transfers Transfers: Sit to Stand;Stand to Sit Sit to Stand: 4: Min assist;With upper extremity assist;From bed Stand to Sit: 4: Min guard;With upper extremity assist;To  chair/3-in-1 Details for Transfer Assistance: Min A during sit to stand due to LLE pain and to ensure safety.  VC's for proper hand placment. Ambulation/Gait Ambulation/Gait Assistance: 4: Min guard Ambulation Distance (Feet): 60 Feet Assistive device: Rolling walker Ambulation/Gait Assistance Details: Min  guard to ensure safety as pt reports increase in LLE pain during ambulation but unable to rate. VC's for gait sequence and to decrease step length to decr. L knee pain. Gait Pattern: Step-to pattern;Decreased stance time - left;Decreased dorsiflexion - left;Antalgic;Decreased stride length Gait velocity: Decreased Stairs: No    Exercises     PT Diagnosis: Difficulty walking;Acute pain  PT Problem List: Decreased strength;Decreased range of motion;Decreased activity tolerance;Decreased knowledge of use of DME;Pain PT Treatment Interventions: DME instruction;Gait training;Stair training;Functional mobility training;Therapeutic activities;Therapeutic exercise;Balance training;Neuromuscular re-education;Patient/family education     PT Goals(Current goals can be found in the care plan section) Acute Rehab PT Goals Patient Stated Goal: to go home PT Goal Formulation: With patient/family (pt and pt's daughter) Time For Goal Achievement: 05/12/13 Potential to Achieve Goals: Good  Visit Information  Last PT Received On: 04/28/13 Assistance Needed: +1 History of Present Illness: Pt is a 67 y/o male s/p L TKA on 04/27/13.       Prior Functioning  Home Living Family/patient expects to be discharged to:: Private residence Living Arrangements: Spouse/significant other;Children Available Help at Discharge: Family;Available 24 hours/day Type of Home: House Home Access: Stairs to enter [employer] of Steps:  8 Entrance Stairs-Rails: Left Home Layout: Two level;Able to live on main level with bedroom/bathroom Home Equipment: Crutches;Other (comment) (walking stick) Prior  Function Level of Independence: Independent Communication Communication: No difficulties    Cognition  Cognition Arousal/Alertness: Awake/alert Behavior During Therapy: WFL for tasks assessed/performed Overall Cognitive Status: Within Functional Limits for tasks assessed    Extremity/Trunk Assessment Lower Extremity Assessment Lower Extremity Assessment: LLE deficits/detail (RLE WFL for tasks assessed.) LLE: Unable to fully assess due to pain   Balance    End of Session PT - End of Session Equipment Utilized During Treatment: Left knee immobilizer Activity Tolerance: Patient limited by fatigue;Patient limited by pain Patient left: in chair;with call bell/phone within reach;with family/visitor present  GP     [name] 04/28/2013, 1:10 PM

## 2013-04-28 NOTE — Care Management Note (Addendum)
    Page 1 of 2   04/29/2013     12:05:17 PM   CARE MANAGEMENT NOTE 04/29/2013  Patient:  Zachary Lyons,Zachary Lyons   Account Number:  [id number]  Date Initiated:  04/28/2013  Documentation initiated by:  Colleen Can  Subjective/Objective Assessment:   dx left knee replacemnt     Action/Plan:   CM spoke with patient. Plans are for patient to return to his home in [place] where spouse will be caregiver. He will need RW and 3n1.  Wants network HH agency.   Anticipated DC Date:  04/29/2013   Anticipated DC Plan:  HOME W HOME HEALTH SERVICES      DC Planning Services  CM consult      PAC Choice  DURABLE MEDICAL EQUIPMENT  HOME HEALTH   Choice offered to / List presented to:  C-1 Patient   DME arranged  3-N-1  Levan Hurst      DME agency  [facility]     HH arranged  HH-2 PT      [facility] agency  [facility]   Status of service:  Completed, signed off Medicare Important Message given?   (If response is "NO", the following Medicare IM given date fields will be blank) Date Medicare IM given:   Date Additional Medicare IM given:    Discharge Disposition:  HOME W HOME HEALTH SERVICES  Per UR Regulation:    If discussed at Long Length of Stay Meetings, dates discussed:    Comments:  04/29/2013 Colleen Can BSN RN CCM [phone] Order for discharge today. [facility] rep delivered DME to room-rw, 3n1.  04/28/2013 Colleen Can BSN RN CCM [phone] TCT [facility] for request of services & DME. They will be able to provide [facility] services with start of day after discharge.

## 2013-04-28 NOTE — Plan of Care (Signed)
Problem: Consults Goal: Diagnosis- Total Joint Replacement Outcome: Completed/Met Date Met:  04/28/13 Primary Total Knee LEFT     

## 2013-04-28 NOTE — Progress Notes (Addendum)
04/28/13 1547  PT Visit Information  Last PT Received On 04/28/13  Assistance Needed +1  History of Present Illness Pt is a 67 y/o male s/p L TKA on 04/27/13.  PT Time Calculation  PT Start Time 1342  PT Stop Time 1408  PT Time Calculation (min) 26 min  Subjective Data  Subjective I want to get better, so I can go home.  Precautions  Precautions Knee;Fall  Required Braces or Orthoses Knee Immobilizer - Left  Restrictions  Weight Bearing Restrictions No  Other Position/Activity Restrictions WBAT  Cognition  Arousal/Alertness Awake/alert  Behavior During Therapy WFL for tasks assessed/performed  Overall Cognitive Status Within Functional Limits for tasks assessed  Bed Mobility  Bed Mobility Sit to Supine  Sit to Supine 4: Min assist  Details for Bed Mobility Assistance Min A to assist LLE into bed due to pain.  Transfers  Transfers Sit to Stand;Stand to Sit  Sit to Stand 4: Min assist;From chair/3-in-1;With upper extremity assist;With armrests  Stand to Sit 4: Min guard;With upper extremity assist;To bed  Details for Transfer Assistance Min A during sit to stand due to LLE pain and to ensure safety. Min guard during stand to sit for safety. VC's to keep LLE extended.  Ambulation/Gait  Ambulation/Gait Assistance 4: Min guard  Ambulation Distance (Feet) 60 Feet  Assistive device Rolling walker  Ambulation/Gait Assistance Details Min guard to ensure safety. VC's for gait sequence, to reduce step length, and to stay within RW.  Gait Pattern Step-to pattern;Decreased stance time - left;Decreased dorsiflexion - left;Antalgic;Decreased stride length  Gait velocity Decreased  Stairs No  Exercises  Exercises Total Joint  Total Joint Exercises  Ankle Circles/Pumps 20 reps;AROM;Both;Supine  Quad Sets AROM;Left;20 reps;Supine  Gluteal Sets 20 reps;AROM;Supine;Both  Short Arc [employer];Supine;Right  Heel Slides AROM;AAROM;Both;20 reps (AAROM LLE)  Hip ABduction/ADduction  AROM;AAROM;Both;20 reps;Supine (AAROM LLE)  PT - End of Session  Equipment Utilized During Treatment Left knee immobilizer  Activity Tolerance Patient limited by pain  Patient left in bed;with call bell/phone within reach  PT - Assessment/Plan  PT Frequency Min 5X/week  Follow Up Recommendations Home health PT;Supervision/Assistance - 24 hour  PT equipment Rolling walker with 5" wheels  PT Goal Progression  Progress towards PT goals Progressing toward goals

## 2013-04-28 NOTE — Progress Notes (Signed)
   Subjective: 1 Day Post-Op Procedure(s) (LRB): LEFT TOTAL KNEE ARTHROPLASTY (Left) Patient reports pain as mild.   Patient seen in rounds with Dr. Lequita Halt. Family in room. Patient is well, and has had no acute complaints or problems We will start therapy today.  Plan is to go home versus rehab after hospital stay.  Objective: Vital signs in last 24 hours: Temp:  [96.2 F (35.7 C)-98.4 F (36.9 C)] 98.4 F (36.9 C) (10/14 0600) Pulse Rate:  [43-69] 69 (10/14 0204) Resp:  [8-20] 16 (10/14 0600) BP: (136-197)/(76-94) 177/93 mmHg (10/14 0600) SpO2:  [97 %-100 %] 100 % (10/14 0600) Weight:  [69.4 kg (153 lb)] 69.4 kg (153 lb) (10/14 0000)  Intake/Output from previous day:  Intake/Output Summary (Last 24 hours) at 04/28/13 0724 Last data filed at 04/28/13 0600  Gross per 24 hour  Intake   2610 ml  Output   1130 ml  Net   1480 ml    Intake/Output this shift:    Labs:  Recent Labs  04/28/13 0504  HGB 13.3    Recent Labs  04/28/13 0504  WBC 7.7  RBC 4.00*  HCT 38.2*  PLT 156    Recent Labs  04/28/13 0504  NA 136  K 3.9  CL 100  CO2 27  BUN 9  CREATININE 0.85  GLUCOSE 129*  CALCIUM 8.9   No results found for this basename: LABPT, INR,  in the last 72 hours  EXAM General - Patient is Alert, Appropriate and Oriented Extremity - Neurovascular intact Sensation intact distally Dorsiflexion/Plantar flexion intact Dressing - dressing C/D/I Motor Function - intact, moving foot and toes well on exam.  Hemovac pulled without difficulty.  Past Medical History  Diagnosis Date  . Hypertension   . GERD (gastroesophageal reflux disease)   . Peptic ulcer     hx. of  . Arthritis     osteoarthritis  . Transfusion history 04-22-13    15 yrs ago-"bleeding ulcer"    Assessment/Plan: 1 Day Post-Op Procedure(s) (LRB): LEFT TOTAL KNEE ARTHROPLASTY (Left) Principal Problem:   OA (osteoarthritis) of knee  Estimated body mass index is 27.98 kg/(m^2) as  calculated from the following:   Height as of this encounter: 5\' 2"  (1.575 m).   Weight as of this encounter: 69.4 kg (153 lb). Advance diet Up with therapy Discharge home with home health versus rehab  DVT Prophylaxis - Xarelto Weight-Bearing as tolerated to left leg D/C O2 and Pulse OX and try on Room Air  PERKINS, ALEXZANDREW 04/28/2013, 7:24 AM

## 2013-04-29 LAB — CBC
HCT: 32.8 % — ABNORMAL LOW (ref 39.0–52.0)
MCH: 33 pg (ref 26.0–34.0)
MCHC: 35.1 g/dL (ref 30.0–36.0)
Platelets: 156 10*3/uL (ref 150–400)
RDW: 12.4 % (ref 11.5–15.5)
WBC: 13 10*3/uL — ABNORMAL HIGH (ref 4.0–10.5)

## 2013-04-29 LAB — BASIC METABOLIC PANEL
BUN: 9 mg/dL (ref 6–23)
Calcium: 9.3 mg/dL (ref 8.4–10.5)
Chloride: 103 mEq/L (ref 96–112)
GFR calc Af Amer: >90 mL/min (ref >90)
GFR calc non Af Amer: 89 mL/min — ABNORMAL LOW (ref >90)
Potassium: 4.4 mEq/L (ref 3.5–5.1)

## 2013-04-29 MED ORDER — METHOCARBAMOL 500 MG PO TABS: 500.0000 mg | ORAL_TABLET | Freq: Four times a day (QID) | ORAL | Status: DC | PRN

## 2013-04-29 MED ORDER — TRAMADOL HCL 50 MG PO TABS: 50.0000 mg | ORAL_TABLET | Freq: Four times a day (QID) | ORAL | Status: DC | PRN

## 2013-04-29 MED ORDER — RIVAROXABAN 10 MG PO TABS: 10.0000 mg | ORAL_TABLET | Freq: Every day | ORAL | Status: DC

## 2013-04-29 MED ORDER — OXYCODONE HCL 5 MG PO TABS: 5.0000 mg | ORAL_TABLET | ORAL | Status: DC | PRN

## 2013-04-29 NOTE — Progress Notes (Signed)
Discharged from floor via w/c, family with pt. No changes in assessment. Zachary Lyons  

## 2013-04-29 NOTE — Progress Notes (Signed)
Occupational Therapy Treatment Patient Details Name: Zachary Lyons MRN: [id number] DOB: 09/04/1945 Today's Date: 04/29/2013 Time: 4098-1191 OT Time Calculation (min): 20 min  OT Assessment / Plan / Recommendation  History of present illness Pt is a 67 y/o male s/p L TKA on 04/27/13.   OT comments  Pt is making good gains:  Occasional cues for safety with RW.  He does want 3:1 commode  Follow Up Recommendations  No OT follow up    Barriers to Discharge       Equipment Recommendations  3 in 1 bedside comode    Recommendations for Other Services    Frequency Min 2X/week   Progress towards OT Goals Progress towards OT goals: Progressing toward goals  Plan      Precautions / Restrictions Precautions Precautions: Knee;Fall Required Braces or Orthoses: Knee Immobilizer - Left Restrictions Weight Bearing Restrictions: No Other Position/Activity Restrictions: WBAT   Pertinent Vitals/Pain None reported.  Pt was premedicated    ADL  G Toilet Transfer: Minimal assistance (using arms on toilet and center bar of walker) Toilet Transfer Method: Sit to stand Toilet Transfer Equipment: Comfort height toilet  Tub/Shower Transfer: Min guard Tub/Shower Transfer Method: Passenger transport manager: Walk in Scientist, research (physical sciences) Used: Rolling walker Transfers/Ambulation Related to ADLs: ambulated to bathroom and practiced transfers.  Pt needed min cues for safety:  keeping legs within walker.  Pt initially stated he didn't want 3:1 over toilet, but is agreeable after performing transfer.   ADL Comments: will have assistance as needed for adls.      OT Diagnosis: Generalized weakness  OT Problem List: Decreased strength;Decreased activity tolerance;Decreased knowledge of use of DME or AE;Pain OT Treatment Interventions: Self-care/ADL training;DME and/or AE instruction;Patient/family education   OT Goals(current goals can now be found in the care plan section) Acute Rehab OT  Goals Patient Stated Goal: to go home OT Goal Formulation: With patient Time For Goal Achievement: 05/05/13 Potential to Achieve Goals: Good  Visit Information  Last OT Received On: 04/29/13 Assistance Needed: +1 History of Present Illness: Pt is a 67 y/o male s/p L TKA on 04/27/13.    Subjective Data      Prior Functioning       Cognition  Cognition Arousal/Alertness: Awake/alert Behavior During Therapy: WFL for tasks assessed/performed Overall Cognitive Status: Within Functional Limits for tasks assessed    Mobility  Bed Mobility Bed Mobility: Sit to Supine Supine to Sit: 4: Min assist Sit to Supine: 4: Min assist Details for Bed Mobility Assistance: assist for LLE Transfers Sit to Stand: 4: Min guard;4: Min assist;From bed;From toilet (min A from toilet; min guard from bed) Stand to Sit: 4: Min guard;With upper extremity assist;To bed Details for Transfer Assistance: Min A during sit to stand due to LLE pain and to ensure safety. Min guard during stand to sit for safety. VC's to keep LLE extended.    Exercises     Balance     End of Session OT - End of Session Activity Tolerance: Patient tolerated treatment well Patient left: in chair;with call bell/phone within reach  GO     [facility] 04/29/2013, 9:13 AM Marica Otter, OTR/L [phone] 04/29/2013

## 2013-04-29 NOTE — Progress Notes (Signed)
I have reviewed this note and agree with all findings. Kati Gleb Mcguire, PT, DPT Pager: [phone]   

## 2013-04-29 NOTE — Discharge Summary (Signed)
Physician Discharge Summary   Patient ID: Zachary Lyons MRN: [id number] DOB/AGE: [birth year] 67 y.o.  Admit date: 04/27/2013 Discharge date: 04/29/2013  Primary Diagnosis:  Osteoarthritis Left knee(s)  Admission Diagnoses:  Past Medical History  Diagnosis Date  . Hypertension   . GERD (gastroesophageal reflux disease)   . Peptic ulcer     hx. of  . Arthritis     osteoarthritis  . Transfusion history 04-22-13    15 yrs ago-"bleeding ulcer"   Discharge Diagnoses:   Principal Problem:   OA (osteoarthritis) of knee  Estimated body mass index is 27.98 kg/(m^2) as calculated from the following:   Height as of this encounter: 5\' 2"  (1.575 m).   Weight as of this encounter: 69.4 kg (153 lb).  Procedure:  Procedure(s) (LRB): LEFT TOTAL KNEE ARTHROPLASTY (Left)   Consults: None  HPI: Zachary Lyons is a 67 y.o. year old male with end stage OA of his left knee with progressively worsening pain and dysfunction. He has constant pain, with activity and at rest and significant functional deficits with difficulties even with ADLs. He has had extensive non-op management including analgesics, injections of cortisone, and home exercise program, but remains in significant pain with significant dysfunction. Radiographs show bone on bone arthritis medial and patellofemoral with varus deformity. He presents now for left Total Knee Arthroplasty.   Laboratory Data: Admission on 04/27/2013, Discharged on 04/29/2013  Component Date Value Range Status  . WBC 04/28/2013 7.7  4.0 - 10.5 K/uL Final  . RBC 04/28/2013 4.00* 4.22 - 5.81 MIL/uL Final  . Hemoglobin 04/28/2013 13.3  13.0 - 17.0 g/dL Final  . HCT 40/98/1191 38.2* 39.0 - 52.0 % Final  . MCV 04/28/2013 95.5  78.0 - 100.0 fL Final  . MCH 04/28/2013 33.3  26.0 - 34.0 pg Final  . MCHC 04/28/2013 34.8  30.0 - 36.0 g/dL Final  . RDW 47/82/9562 12.2  11.5 - 15.5 % Final  . Platelets 04/28/2013 156  150 - 400 K/uL Final  . Sodium 04/28/2013 136  135 -  145 mEq/L Final  . Potassium 04/28/2013 3.9  3.5 - 5.1 mEq/L Final  . Chloride 04/28/2013 100  96 - 112 mEq/L Final  . CO2 04/28/2013 27  19 - 32 mEq/L Final  . Glucose, Bld 04/28/2013 129* 70 - 99 mg/dL Final  . BUN 13/02/6577 9  6 - 23 mg/dL Final  . Creatinine, Ser 04/28/2013 0.85  0.50 - 1.35 mg/dL Final  . Calcium 46/96/2952 8.9  8.4 - 10.5 mg/dL Final  . GFR calc non Af Amer 04/28/2013 88* >90 mL/min Final  . GFR calc Af Amer 04/28/2013 >90  >90 mL/min Final   Comment: (NOTE)                          The eGFR has been calculated using the CKD EPI equation.                          This calculation has not been validated in all clinical situations.                          eGFR's persistently <90 mL/min signify possible Chronic Kidney                          Disease.  . WBC 04/29/2013 13.0* 4.0 - 10.5 K/uL Final  .  RBC 04/29/2013 3.49* 4.22 - 5.81 MIL/uL Final  . Hemoglobin 04/29/2013 11.5* 13.0 - 17.0 g/dL Final  . HCT 04/54/0981 32.8* 39.0 - 52.0 % Final  . MCV 04/29/2013 94.0  78.0 - 100.0 fL Final  . MCH 04/29/2013 33.0  26.0 - 34.0 pg Final  . MCHC 04/29/2013 35.1  30.0 - 36.0 g/dL Final  . RDW 19/14/7829 12.4  11.5 - 15.5 % Final  . Platelets 04/29/2013 156  150 - 400 K/uL Final  . Sodium 04/29/2013 137  135 - 145 mEq/L Final  . Potassium 04/29/2013 4.4  3.5 - 5.1 mEq/L Final  . Chloride 04/29/2013 103  96 - 112 mEq/L Final  . CO2 04/29/2013 26  19 - 32 mEq/L Final  . Glucose, Bld 04/29/2013 185* 70 - 99 mg/dL Final  . BUN 56/21/3086 9  6 - 23 mg/dL Final  . Creatinine, Ser 04/29/2013 0.84  0.50 - 1.35 mg/dL Final  . Calcium 57/84/6962 9.3  8.4 - 10.5 mg/dL Final  . GFR calc non Af Amer 04/29/2013 89* >90 mL/min Final  . GFR calc Af Amer 04/29/2013 >90  >90 mL/min Final   Comment: (NOTE)                          The eGFR has been calculated using the CKD EPI equation.                          This calculation has not been validated in all clinical situations.                           eGFR's persistently <90 mL/min signify possible Chronic Kidney                          Disease.  Hospital Outpatient Visit on 04/22/2013  Component Date Value Range Status  . MRSA, PCR 04/22/2013 NEGATIVE  NEGATIVE Final  . Staphylococcus aureus 04/22/2013 POSITIVE* NEGATIVE Final   Comment:                                 The Xpert SA Assay (FDA                          approved for NASAL specimens                          in patients over 33 years of age),                          is one component of                          a comprehensive surveillance                          program.  Test performance has                          been validated by [employer]  [facility] for patients greater                          than or equal to 57 year old.                          It is not intended                          to diagnose infection nor to                          guide or monitor treatment.  Marland Kitchen aPTT 04/22/2013 28  24 - 37 seconds Final  . WBC 04/22/2013 6.2  4.0 - 10.5 K/uL Final  . RBC 04/22/2013 4.53  4.22 - 5.81 MIL/uL Final  . Hemoglobin 04/22/2013 15.3  13.0 - 17.0 g/dL Final  . HCT 16/04/9603 43.5  39.0 - 52.0 % Final  . MCV 04/22/2013 96.0  78.0 - 100.0 fL Final  . MCH 04/22/2013 33.8  26.0 - 34.0 pg Final  . MCHC 04/22/2013 35.2  30.0 - 36.0 g/dL Final  . RDW 54/03/8118 12.5  11.5 - 15.5 % Final  . Platelets 04/22/2013 158  150 - 400 K/uL Final  . Sodium 04/22/2013 142  135 - 145 mEq/L Final  . Potassium 04/22/2013 3.7  3.5 - 5.1 mEq/L Final  . Chloride 04/22/2013 106  96 - 112 mEq/L Final  . CO2 04/22/2013 29  19 - 32 mEq/L Final  . Glucose, Bld 04/22/2013 91  70 - 99 mg/dL Final  . BUN 14/78/2956 9  6 - 23 mg/dL Final  . Creatinine, Ser 04/22/2013 0.87  0.50 - 1.35 mg/dL Final  . Calcium 21/30/8657 9.6  8.4 - 10.5 mg/dL Final  . Total Protein 04/22/2013 7.5  6.0 - 8.3 g/dL Final  . Albumin 84/69/6295 3.7  3.5 - 5.2 g/dL Final  .  AST 28/41/3244 20  0 - 37 U/L Final  . ALT 04/22/2013 15  0 - 53 U/L Final  . Alkaline Phosphatase 04/22/2013 109  39 - 117 U/L Final  . Total Bilirubin 04/22/2013 0.7  0.3 - 1.2 mg/dL Final  . GFR calc non Af Amer 04/22/2013 87* >90 mL/min Final  . GFR calc Af Amer 04/22/2013 >90  >90 mL/min Final   Comment: (NOTE)                          The eGFR has been calculated using the CKD EPI equation.                          This calculation has not been validated in all clinical situations.                          eGFR's persistently <90 mL/min signify possible Chronic Kidney                          Disease.  Marland Kitchen Prothrombin Time 04/22/2013 12.7  11.6 - 15.2 seconds Final  . INR 04/22/2013 0.97  0.00 - 1.49 Final  . ABO/RH(D) 04/22/2013 O POS   Final  . Antibody Screen 04/22/2013 NEG   Final  . Sample Expiration 04/22/2013 04/30/2013   Final  .  Color, Urine 04/22/2013 YELLOW  YELLOW Final  . APPearance 04/22/2013 CLEAR  CLEAR Final  . Specific Gravity, Urine 04/22/2013 1.024  1.005 - 1.030 Final  . pH 04/22/2013 8.0  5.0 - 8.0 Final  . Glucose, UA 04/22/2013 NEGATIVE  NEGATIVE mg/dL Final  . Hgb urine dipstick 04/22/2013 NEGATIVE  NEGATIVE Final  . Bilirubin Urine 04/22/2013 NEGATIVE  NEGATIVE Final  . Ketones, ur 04/22/2013 NEGATIVE  NEGATIVE mg/dL Final  . Protein, ur 19/14/7829 NEGATIVE  NEGATIVE mg/dL Final  . Urobilinogen, UA 04/22/2013 1.0  0.0 - 1.0 mg/dL Final  . Nitrite 56/21/3086 NEGATIVE  NEGATIVE Final  . Leukocytes, UA 04/22/2013 NEGATIVE  NEGATIVE Final   MICROSCOPIC NOT DONE ON URINES WITH NEGATIVE PROTEIN, BLOOD, LEUKOCYTES, NITRITE, OR GLUCOSE <1000 mg/dL.  . ABO/RH(D) 04/22/2013 O POS   Final     X-Rays:Dg Chest 2 View  04/22/2013   CLINICAL DATA:  Hypertension.  EXAM: CHEST  2 VIEW  COMPARISON:  None.  FINDINGS: The heart size and mediastinal contours are within normal limits. Both lungs are clear. The visualized skeletal structures are unremarkable.  IMPRESSION: No  active cardiopulmonary disease.   Electronically Signed   By: Roque Lias M.D.   On: 04/22/2013 13:54    EKG:No orders found for this or any previous visit.   Hospital Course: Zachary Lyons is a 67 y.o. who was admitted to [facility]. They were brought to the operating room on 04/27/2013 and underwent Procedure(s): LEFT TOTAL KNEE ARTHROPLASTY.  Patient tolerated the procedure well and was later transferred to the recovery room and then to the orthopaedic floor for postoperative care.  They were given PO and IV analgesics for pain control following their surgery.  They were given 24 hours of postoperative antibiotics of  Anti-infectives   Start     Dose/Rate Route Frequency Ordered Stop   04/27/13 2200  ceFAZolin (ANCEF) IVPB 1 g/50 mL premix     1 g 100 mL/hr over 30 Minutes Intravenous Every 6 hours 04/27/13 1918 04/28/13 0557   04/27/13 1245  ceFAZolin (ANCEF) IVPB 2 g/50 mL premix     2 g 100 mL/hr over 30 Minutes Intravenous On call to O.R. 04/27/13 1236 04/27/13 1500     and started on DVT prophylaxis in the form of Xarelto.   PT and OT were ordered for total joint protocol.  Discharge planning consulted to help with postop disposition and equipment needs.  Patient had a good night on the evening of surgery.  They started to get up OOB with therapy on day one walking 60 feet. Hemovac drain was pulled without difficulty.  Continued to work with therapy into day two.  Dressing was changed on day two and the incision was healing. Patient was seen in rounds and was ready to go home later that same day after therapy.   Discharge Medications: Prior to Admission medications   Medication Sig Start Date End Date Taking? Authorizing Provider  losartan (COZAAR) 50 MG tablet Take 50 mg by mouth 2 (two) times daily.   Yes Historical Provider, MD  metoprolol (TOPROL XL) 200 MG 24 hr tablet Take 300 mg by mouth every morning. Takes 1 and 1/2   Yes Historical Provider, MD  omeprazole  (PRILOSEC) 40 MG capsule Take 40 mg by mouth daily.   Yes Historical Provider, MD  simvastatin (ZOCOR) 20 MG tablet Take 20 mg by mouth at bedtime.   Yes Historical Provider, MD  methocarbamol (ROBAXIN) 500 MG tablet Take 1  tablet (500 mg total) by mouth every 6 (six) hours as needed. 04/29/13   Alexzandrew Perkins, PA-C  oxyCODONE (OXY IR/ROXICODONE) 5 MG immediate release tablet Take 1-2 tablets (5-10 mg total) by mouth every 3 (three) hours as needed. 04/29/13   Alexzandrew Julien Girt, PA-C  rivaroxaban (XARELTO) 10 MG TABS tablet Take 1 tablet (10 mg total) by mouth daily with breakfast. Take Xarelto for two and a half more weeks, then discontinue Xarelto. Once the patient has completed the blood thinner regimen, then take a Baby 81 mg Aspirin daily for four more weeks. 04/29/13   Alexzandrew Perkins, PA-C  traMADol (ULTRAM) 50 MG tablet Take 1-2 tablets (50-100 mg total) by mouth every 6 (six) hours as needed for pain. 04/29/13   Alexzandrew Julien Girt, PA-C   Discharge home with home health  Diet - Cardiac diet  Follow up - in 2 weeks  Activity - WBAT  Disposition - Home  Condition Upon Discharge - Good  D/C Meds - See DC Summary  DVT Prophylaxis - Xarelto       Discharge Orders   Future Orders Complete By Expires   Call MD / Call 911  As directed    Comments:     If you experience chest pain or shortness of breath, CALL 911 and be transported to the hospital emergency room.  If you develope a fever above 101 F, pus (white drainage) or increased drainage or redness at the wound, or calf pain, call your surgeon's office.   Change dressing  As directed    Comments:     Change dressing daily with sterile 4 x 4 inch gauze dressing and apply TED hose. Do not submerge the incision under water.   Constipation Prevention  As directed    Comments:     Drink plenty of fluids.  Prune juice may be helpful.  You may use a stool softener, such as Colace (over the counter) 100 mg twice a day.  Use  MiraLax (over the counter) for constipation as needed.   Diet - low sodium heart healthy  As directed    Discharge instructions  As directed    Comments:     Pick up stool softner and laxative for home. Do not submerge incision under water. May shower. Continue to use ice for pain and swelling from surgery.  Take Xarelto for two and a half more weeks, then discontinue Xarelto. Once the patient has completed the blood thinner regimen, then take a Baby 81 mg Aspirin daily for four more weeks.   Do not put a pillow under the knee. Place it under the heel.  As directed    Do not sit on low chairs, stoools or toilet seats, as it may be difficult to get up from low surfaces  As directed    Driving restrictions  As directed    Comments:     No driving until released by the physician.   Increase activity slowly as tolerated  As directed    Lifting restrictions  As directed    Comments:     No lifting until released by the physician.   Patient may shower  As directed    Comments:     You may shower without a dressing once there is no drainage.  Do not wash over the wound.  If drainage remains, do not shower until drainage stops.   TED hose  As directed    Comments:     Use stockings (TED hose) for 3  weeks on both leg(s).  You may remove them at night for sleeping.   Weight bearing as tolerated  As directed    Questions:     Laterality:     Extremity:         Medication List    STOP taking these medications       LUTEIN-ZEAXANTHIN PO     multivitamin with minerals Tabs tablet      TAKE these medications       losartan 50 MG tablet  Commonly known as:  COZAAR  Take 50 mg by mouth 2 (two) times daily.     methocarbamol 500 MG tablet  Commonly known as:  ROBAXIN  Take 1 tablet (500 mg total) by mouth every 6 (six) hours as needed.     omeprazole 40 MG capsule  Commonly known as:  PRILOSEC  Take 40 mg by mouth daily.     oxyCODONE 5 MG immediate release tablet  Commonly  known as:  Oxy IR/ROXICODONE  Take 1-2 tablets (5-10 mg total) by mouth every 3 (three) hours as needed.     rivaroxaban 10 MG Tabs tablet  Commonly known as:  XARELTO  - Take 1 tablet (10 mg total) by mouth daily with breakfast. Take Xarelto for two and a half more weeks, then discontinue Xarelto.  - Once the patient has completed the blood thinner regimen, then take a Baby 81 mg Aspirin daily for four more weeks.     simvastatin 20 MG tablet  Commonly known as:  ZOCOR  Take 20 mg by mouth at bedtime.     TOPROL XL 200 MG 24 hr tablet  Generic drug:  metoprolol  Take 300 mg by mouth every morning. Takes 1 and 1/2     traMADol 50 MG tablet  Commonly known as:  ULTRAM  Take 1-2 tablets (50-100 mg total) by mouth every 6 (six) hours as needed for pain.       Follow-up Information   Follow up with Loanne Drilling, MD. Schedule an appointment as soon as possible for a visit on 05/12/2013.   Specialty:  Orthopedic Surgery   Contact information:   [address] [place] [address] [phone]       Signed: Patrica Duel 05/19/2013, 11:37 AM

## 2013-04-29 NOTE — Progress Notes (Signed)
[facility]  [facility] is providing the following services: RW and Commode  If patient discharges after hours, please call [phone].   Renard Hamper 04/29/2013, 8:50 AM

## 2013-04-29 NOTE — Progress Notes (Signed)
   Subjective: 2 Days Post-Op Procedure(s) (LRB): LEFT TOTAL KNEE ARTHROPLASTY (Left) Patient reports pain as mild.   Patient seen in rounds with Dr. Lequita Halt. Patient is well, and has had no acute complaints or problems Patient is ready to go home  Objective: Vital signs in last 24 hours: Temp:  [98.3 F (36.8 C)] 98.3 F (36.8 C) (10/15 0624) Pulse Rate:  [67-87] 68 (10/15 0624) Resp:  [16] 16 (10/15 0624) BP: (149-162)/(73-92) 149/73 mmHg (10/15 0624) SpO2:  [95 %-99 %] 98 % (10/15 0624)  Intake/Output from previous day:  Intake/Output Summary (Last 24 hours) at 04/29/13 1055 Last data filed at 04/29/13 0624  Gross per 24 hour  Intake   1200 ml  Output   1625 ml  Net   -425 ml    Intake/Output this shift:    Labs:  Recent Labs  04/28/13 0504 04/29/13 0535  HGB 13.3 11.5*    Recent Labs  04/28/13 0504 04/29/13 0535  WBC 7.7 13.0*  RBC 4.00* 3.49*  HCT 38.2* 32.8*  PLT 156 156    Recent Labs  04/28/13 0504 04/29/13 0535  NA 136 137  K 3.9 4.4  CL 100 103  CO2 27 26  BUN 9 9  CREATININE 0.85 0.84  GLUCOSE 129* 185*  CALCIUM 8.9 9.3   No results found for this basename: LABPT, INR,  in the last 72 hours  EXAM: General - Patient is Alert and Appropriate Extremity - Neurovascular intact Sensation intact distally Incision - clean, dry, healing Motor Function - intact, moving foot and toes well on exam.   Assessment/Plan: 2 Days Post-Op Procedure(s) (LRB): LEFT TOTAL KNEE ARTHROPLASTY (Left) Procedure(s) (LRB): LEFT TOTAL KNEE ARTHROPLASTY (Left) Past Medical History  Diagnosis Date  . Hypertension   . GERD (gastroesophageal reflux disease)   . Peptic ulcer     hx. of  . Arthritis     osteoarthritis  . Transfusion history 04-22-13    15 yrs ago-"bleeding ulcer"   Principal Problem:   OA (osteoarthritis) of knee  Estimated body mass index is 27.98 kg/(m^2) as calculated from the following:   Height as of this encounter: 5\' 2"  (1.575  m).   Weight as of this encounter: 69.4 kg (153 lb). Up with therapy Discharge home with home health Diet - Cardiac diet Follow up - in 2 weeks Activity - WBAT Disposition - Home Condition Upon Discharge - Good D/C Meds - See DC Summary DVT Prophylaxis - Xarelto  Klani Caridi 04/29/2013, 10:55 AM

## 2013-04-29 NOTE — Progress Notes (Signed)
Occupational Therapy Evaluation Patient Details Name: Zachary Lyons MRN: [id number] DOB: [birth year] Today's Date: 04/29/2013 Time:  - 1309 -1326 17 minutes    OT Assessment / Plan / Recommendation History of present illness Pt is a 67 y/o male s/p L TKA on 04/27/13.   Clinical Impression   Pt was admitted for the above.  He will be followed in acute to complete education.  No further OT is needed post acute.      OT Assessment  Patient needs continued OT Services    Follow Up Recommendations  No OT follow up    Barriers to Discharge      Equipment Recommendations  3 in 1 bedside comode    Recommendations for Other Services    Frequency  Min 2X/week    Precautions / Restrictions Precautions Precautions: Knee;Fall Required Braces or Orthoses: Knee Immobilizer - Left Restrictions Weight Bearing Restrictions: No Other Position/Activity Restrictions: WBAT   Pertinent Vitals/Pain LLE sore; reapplied ice and repositioned    ADL  Grooming: Set up Where Assessed - Grooming: Unsupported sitting Upper Body Bathing: Set up Where Assessed - Upper Body Bathing: Unsupported sitting Lower Body Bathing: Minimal assistance Where Assessed - Lower Body Bathing: Supported sit to stand Where Assessed - Upper Body Dressing: Unsupported sitting Lower Body Dressing: Moderate assistance Where Assessed - Lower Body Dressing: Supported sit to stand Toileting - Architect and Hygiene: Simulated;Minimal assistance Where Assessed - Engineer, mining and Hygiene: Sit to stand from 3-in-1 or toilet Equipment Used: Rolling walker Transfers/Ambulation Related to ADLs: sit to stand only.  Pt got nauseas with movement, but this passed ADL Comments: will have assistance as needed for adls.      OT Diagnosis: Generalized weakness  OT Problem List: Decreased strength;Decreased activity tolerance;Decreased knowledge of use of DME or AE;Pain OT Treatment Interventions:  Self-care/ADL training;DME and/or AE instruction;Patient/family education   OT Goals(Current goals can be found in the care plan section) Acute Rehab OT Goals Patient Stated Goal: to go home OT Goal Formulation: With patient Time For Goal Achievement: 05/05/13 Potential to Achieve Goals: Good  Visit Information  Assistance Needed: +1 History of Present Illness: Pt is a 67 y/o male s/p L TKA on 04/27/13.       Prior Functioning       independent        Vision/Perception     Cognition  Cognition Arousal/Alertness: Awake/alert Behavior During Therapy: WFL for tasks assessed/performed Overall Cognitive Status: Within Functional Limits for tasks assessed    Extremity/Trunk Assessment   Bil UEs wfls, pt c/o soreness in bil shoulders from job       Exercise    Balance     End of Session  Pt left in chair with call bell  GO     Gredmarie Delange 04/29/2013, 7:17 AM Marica Otter, OTR/L [phone] 04/29/2013 filed for yesterday

## 2013-04-29 NOTE — Progress Notes (Signed)
Physical Therapy Treatment Patient Details Name: Kayo Zion MRN: [id number] DOB: Jun 23, 1946 Today's Date: 04/29/2013 Time: 6578-4696 PT Time Calculation (min): 38 min  PT Assessment / Plan / Recommendation  History of Present Illness Pt is a 67 y/o male s/p L TKA on 04/27/13.   PT Comments   Pt demonstrating progress as he was able to ambulate longer distances today with less assist. Pt also educated and practiced proper stair technique with min guard. PT provided pt with stair and LE strengthening exercise handouts and educated pt on performing LE exercises and discussed HHPT with progression to OPPT in order to improve LLE ROM, strength, and endurance, pt seemed agreeable. Pt would continue to benefit from skilled PT in order to improve functional mobility and safety.  Follow Up Recommendations  Home health PT;Supervision for mobility/OOB     Does the patient have the potential to tolerate intense rehabilitation     Barriers to Discharge        Equipment Recommendations  Rolling walker with 5" wheels    Recommendations for Other Services    Frequency 7X/week   Progress towards PT Goals Progress towards PT goals: Progressing toward goals  Plan Current plan remains appropriate    Precautions / Restrictions Precautions Precautions: Knee;Fall Required Braces or Orthoses: Knee Immobilizer - Left Restrictions Weight Bearing Restrictions: No Other Position/Activity Restrictions: WBAT   Pertinent Vitals/Pain No c/o of pain, dizziness, or SOB during session. RN administered medications to pt at end of session.    Mobility  Bed Mobility Bed Mobility: Not assessed Supine to Sit: 4: Min assist Sit to Supine: 4: Min assist Details for Bed Mobility Assistance: pt seated in chair upon arrival. Transfers Transfers: Sit to Stand;Stand to Sit Sit to Stand: From chair/3-in-1;With armrests;With upper extremity assist;4: Min guard Stand to Sit: 4: Min guard;With upper extremity  assist;With armrests Details for Transfer Assistance: Min guard to ensure safety during sit<>stand x2. No VC's required for hand placement. Ambulation/Gait Ambulation/Gait Assistance: 4: Min guard Ambulation Distance (Feet): 200 Feet Assistive device: Rolling walker Ambulation/Gait Assistance Details: Min guard to ensure safety with pt progressing to supervision. Pt reports no pain at rest or during ambulation. Gait Pattern: Step-to pattern;Decreased stance time - left;Antalgic;Decreased stride length Gait velocity: Decreased Stairs: Yes Stairs Assistance: 4: Min guard Stairs Assistance Details (indicate cue type and reason): Pt able to ascend/descend 8 steps with one handrail on L side and one crutch with min guard. VC's and demo to traverse stairs going up with RLE and down with the LLE. Stair Management Technique: One rail Left;With crutches Number of Stairs: 8    Exercises Total Joint Exercises Ankle Circles/Pumps: 20 reps;AROM;Both;Supine (LE exercise handout given to pt) Quad Sets: AROM;Left;20 reps;Supine Gluteal Sets: 20 reps;AROM;Supine;Both Short Arc Quad: AROM;20 reps;Supine;Right Heel Slides: AAROM;20 reps;10 reps Hip ABduction/ADduction: AAROM;Both;Supine;10 reps Straight Leg Raises: AROM;Left;10 reps;Supine (PT educated pt on importance of SLR as pt able to d/c knee immobilizer once able to perform SLR.)   PT Diagnosis:    PT Problem List:   PT Treatment Interventions:     PT Goals (current goals can now be found in the care plan section) Acute Rehab PT Goals Patient Stated Goal: to go home  Visit Information  Last PT Received On: 04/29/13 Assistance Needed: +1 History of Present Illness: Pt is a 67 y/o male s/p L TKA on 04/27/13.    Subjective Data  Patient Stated Goal: to go home   Cognition  Cognition Arousal/Alertness: Awake/alert Behavior During Therapy: [facility]  for tasks assessed/performed Overall Cognitive Status: Within Functional Limits for tasks  assessed    Balance     End of Session PT - End of Session Equipment Utilized During Treatment: Left knee immobilizer Activity Tolerance: Patient tolerated treatment well Patient left: with call bell/phone within reach;in chair   GP     [name] 04/29/2013, 10:14 AM

## 2014-02-08 ENCOUNTER — Ambulatory Visit: Payer: BC Managed Care – PPO | Admitting: Family Medicine

## 2014-04-08 ENCOUNTER — Encounter: Payer: Self-pay | Admitting: Family Medicine

## 2014-04-08 ENCOUNTER — Ambulatory Visit (INDEPENDENT_AMBULATORY_CARE_PROVIDER_SITE_OTHER): Payer: 59 | Admitting: Family Medicine

## 2014-04-08 VITALS — BP 138/74 | HR 63 | Ht 64.0 in | Wt 164.0 lb

## 2014-04-08 DIAGNOSIS — R5381 Other malaise: Secondary | ICD-10-CM

## 2014-04-08 DIAGNOSIS — E785 Hyperlipidemia, unspecified: Secondary | ICD-10-CM | POA: Insufficient documentation

## 2014-04-08 DIAGNOSIS — Z7289 Other problems related to lifestyle: Secondary | ICD-10-CM

## 2014-04-08 DIAGNOSIS — F1010 Alcohol abuse, uncomplicated: Secondary | ICD-10-CM

## 2014-04-08 DIAGNOSIS — B356 Tinea cruris: Secondary | ICD-10-CM

## 2014-04-08 DIAGNOSIS — F1090 Alcohol use, unspecified, uncomplicated: Secondary | ICD-10-CM

## 2014-04-08 DIAGNOSIS — R5383 Other fatigue: Secondary | ICD-10-CM

## 2014-04-08 DIAGNOSIS — I10 Essential (primary) hypertension: Secondary | ICD-10-CM | POA: Insufficient documentation

## 2014-04-08 DIAGNOSIS — R5382 Chronic fatigue, unspecified: Secondary | ICD-10-CM

## 2014-04-08 MED ORDER — CLOTRIMAZOLE 1 % EX CREA: TOPICAL_CREAM | CUTANEOUS | Status: AC

## 2014-04-08 MED ORDER — METOPROLOL SUCCINATE ER 200 MG PO TB24: 300.0000 mg | ORAL_TABLET | Freq: Every morning | ORAL | Status: DC

## 2014-04-08 MED ORDER — LOSARTAN POTASSIUM 100 MG PO TABS: 100.0000 mg | ORAL_TABLET | Freq: Every day | ORAL | Status: DC

## 2014-04-08 NOTE — Progress Notes (Signed)
CC: Zachary Lyons is a 68 y.o. male is here for Establish Care   Subjective: HPI:  Accompanied by wife, Zachary Lyons  Reports history of essential hypertension spinning back an unknown amount of years. For the past year he's been taking losartan and metoprolol. If he stops taking metoprolol he'll have a racing heart however provided he takes on a daily basis he denies any chest pain, irregular heart beat nor any sensation of his heart beat.  No formal exercise routine, tries to watch what he eats.  Reports a history of hyperlipidemia: About a year ago he decided to see if he still needed to be on cholesterol-lowering medication and per his wife's report his numbers were "through the roof" since returning back on Zocor on a daily basis he has not had his cholesterol checked in the past what sounds like 6 months. He takes on a daily basis with our upper quadrant pain nor myalgias  Complains of bilateral groin itching that comes and goes on a monthly basis. It is alleviated by a few days of the unknown cream that and prescribe him in the past. Symptoms are worse and occur when conditions provide a warm moist environment in his right and left groin. He denies any skin changes elsewhere. It is itchy and never painful.  Complains of fatigue has been present ever since a left knee replacement a little over a year ago. It is described as bilateral lower extremity weakness it comes on within a minute or 2 of walking. He denies cramping and he finds it only has weakness.  Nothing seems to make it worse other than walking, it improves after resting for a few minutes. It's described as fatigue and moderate in severity unchanging over the past year.  Reports that he drinks 6-9 beers on a daily basis and is not interested in quitting  Review of Systems - General ROS: negative for - chills, fever, night sweats, weight gain or weight loss Ophthalmic ROS: negative for - decreased vision Psychological ROS: negative for -  anxiety or depression ENT ROS: negative for - hearing change, nasal congestion, tinnitus or allergies Hematological and Lymphatic ROS: negative for - bleeding problems, bruising or swollen lymph nodes Breast ROS: negative Respiratory ROS: no cough, shortness of breath, or wheezing Cardiovascular ROS: no chest pain or dyspnea on exertion Gastrointestinal ROS: no abdominal pain, change in bowel habits, or black or bloody stools Genito-Urinary ROS: negative for - genital discharge, genital ulcers, incontinence or abnormal bleeding from genitals Musculoskeletal ROS: negative for - joint pain or muscle pain Neurological ROS: negative for - headaches or memory loss Dermatological ROS: negative for lumps, mole changes, rash and skin lesion changes other than that described above  Past Medical History  Diagnosis Date  . Hypertension   . GERD (gastroesophageal reflux disease)   . Peptic ulcer     hx. of  . Arthritis     osteoarthritis  . Transfusion history 04-22-13    15 yrs ago-"bleeding ulcer"    Past Surgical History  Procedure Laterality Date  . Nasal sinus surgery    . Stomach surgery      oversew of bleeding ulcer  . Colonoscopy    . Cataract extraction Left 04-22-13  . Total knee arthroplasty Left 04/27/2013    Procedure: LEFT TOTAL KNEE ARTHROPLASTY;  Surgeon: Loanne Drilling, MD;  Location: [facility];  Service: Orthopedics;  Laterality: Left;   History reviewed. No pertinent family history.  History   Social History  .  Marital Status: Married    Spouse Name: N/A    Number of Children: N/A  . Years of Education: N/A   Occupational History  . Not on file.   Social History Main Topics  . Smoking status: Former Smoker    Quit date: 04/23/1979  . Smokeless tobacco: Not on file  . Alcohol Use: 3.6 oz/week    6 Cans of beer per week  . Drug Use: No  . Sexual Activity: Yes    Partners: Female   Other Topics Concern  . Not on file   Social History Narrative  . No  narrative on file     Objective: BP 138/74  Pulse 63  Ht  (1.626 m)  Wt 164 lb (74.39 kg)  BMI 28.14 kg/m2  General: Alert and Oriented, No Acute Distress HEENT: Pupils equal, round, reactive to light. Conjunctivae clear.  Moist mucous membranes pharynx unremarkable Lungs: Clear to auscultation bilaterally, no wheezing/ronchi/rales.  Comfortable work of breathing. Good air movement. Cardiac: Regular rate and rhythm. Normal S1/S2.  No murmurs, rubs, nor gallops.   Extremities: No peripheral edema.  Strong peripheral pulses.  Mental Status: No depression, anxiety, nor agitation. Skin: Warm and dry.  Assessment & Plan: [name] was seen today for establish care.  Diagnoses and associated orders for this visit:  Essential hypertension, benign - losartan (COZAAR) 100 MG tablet; Take 1 tablet (100 mg total) by mouth daily. - metoprolol (TOPROL XL) 200 MG 24 hr tablet; Take 1.5 tablets (300 mg total) by mouth every morning. Takes 1 and 1/2 - COMPLETE METABOLIC PANEL WITH GFR  Hyperlipidemia - Lipid panel  Chronic fatigue - Vit D  25 hydroxy (rtn osteoporosis monitoring) - CBC  Jock itch - clotrimazole (LOTRIMIN) 1 % cream; Apply to affected areas twice a day for up to four weeks, applying up to two weeks after resolution of symptoms.  Habitual alcohol use    Essential hypertension: Stage II hypertension on initial reading however after sitting and conversing for a few minutes blood pressure was brought out of the pre-hypertensive state therefore continue metoprolol and losartan. Checking renal function. Hyperlipidemia: Due for lipid panel and liver enzyme check continue Zocor pending results, he will need refills based on results Fatigue: Checking vitamin D and hemoglobin level Jock itch: Resume as needed clotrimazole Habitual alcohol use: Encouraged to cut back   Return in about 3 months (around 07/08/2014).

## 2014-04-13 ENCOUNTER — Telehealth: Payer: Self-pay | Admitting: Family Medicine

## 2014-04-13 DIAGNOSIS — E785 Hyperlipidemia, unspecified: Secondary | ICD-10-CM

## 2014-04-13 DIAGNOSIS — R739 Hyperglycemia, unspecified: Secondary | ICD-10-CM

## 2014-04-13 LAB — COMPLETE METABOLIC PANEL WITH GFR
ALK PHOS: 127 U/L — AB (ref 39–117)
ALT: 64 U/L — AB (ref 0–53)
AST: 66 U/L — AB (ref 0–37)
Albumin: 4.2 g/dL (ref 3.5–5.2)
BUN: 11 mg/dL (ref 6–23)
CALCIUM: 9 mg/dL (ref 8.4–10.5)
CO2: 27 mEq/L (ref 19–32)
CREATININE: 0.94 mg/dL (ref 0.50–1.35)
Chloride: 106 mEq/L (ref 96–112)
GFR, Est African American: >89 mL/min
GFR, Est Non African American: 83 mL/min
Glucose, Bld: 131 mg/dL — ABNORMAL HIGH (ref 70–99)
Potassium: 3.6 mEq/L (ref 3.5–5.3)
Sodium: 144 mEq/L (ref 135–145)
Total Bilirubin: 1 mg/dL (ref 0.2–1.2)
Total Protein: 7.2 g/dL (ref 6.0–8.3)

## 2014-04-13 LAB — LIPID PANEL
Cholesterol: 207 mg/dL — ABNORMAL HIGH (ref 0–200)
HDL: 73 mg/dL (ref >39)
LDL CALC: 105 mg/dL — AB (ref 0–99)
TRIGLYCERIDES: 143 mg/dL (ref <150)
Total CHOL/HDL Ratio: 2.8 Ratio
VLDL: 29 mg/dL (ref 0–40)

## 2014-04-13 LAB — CBC
HEMATOCRIT: 40.9 % (ref 39.0–52.0)
HEMOGLOBIN: 14.4 g/dL (ref 13.0–17.0)
MCH: 34.4 pg — ABNORMAL HIGH (ref 26.0–34.0)
MCHC: 35.2 g/dL (ref 30.0–36.0)
MCV: 97.8 fL (ref 78.0–100.0)
Platelets: 144 10*3/uL — ABNORMAL LOW (ref 150–400)
RBC: 4.18 MIL/uL — AB (ref 4.22–5.81)
RDW: 14.2 % (ref 11.5–15.5)
WBC: 6.2 10*3/uL (ref 4.0–10.5)

## 2014-04-13 LAB — VITAMIN D 25 HYDROXY (VIT D DEFICIENCY, FRACTURES): Vit D, 25-Hydroxy: 58 ng/mL (ref 30–89)

## 2014-04-13 MED ORDER — SIMVASTATIN 10 MG PO TABS: 10.0000 mg | ORAL_TABLET | Freq: Every day | ORAL | Status: DC

## 2014-04-13 NOTE — Telephone Encounter (Signed)
Pt's wife was notified and we cannot add A1c since lipids and cmp use only yellow top tube and A1c need lavender top. Do you want him to com in for this or wait until he comes back in 3 months?

## 2014-04-13 NOTE — Telephone Encounter (Signed)
Sue Lushndrea, Will you please let patient know that his kidney function, vitamin D, and hemoglobin level were all normal.  His cholesterol is controlled however his liver enzymes are elevated likely due to simvastatin use and alcohol consumption.  I'd recommend he cut back to only 10mg  of simvastatin and drink no more than 3 12oz beers a day do reduce this liver inflammation.  I've sent an Rx to his mail-order pharmacy, I'd recommend he return in 3 months.  Also, his blood sugar was moderately elevated (can you please see if the lab can add an A1c?), and he'll need additional blood sugar testing.

## 2014-04-14 NOTE — Telephone Encounter (Signed)
Pt's spouse notified.

## 2014-04-14 NOTE — Telephone Encounter (Signed)
Lab only visit at his convenience. Slip placed in your inbox.

## 2014-04-20 ENCOUNTER — Encounter: Payer: Self-pay | Admitting: Family Medicine

## 2014-04-20 DIAGNOSIS — R7303 Prediabetes: Secondary | ICD-10-CM | POA: Insufficient documentation

## 2014-04-20 HISTORY — DX: Prediabetes: R73.03

## 2014-04-20 LAB — HEMOGLOBIN A1C
Hgb A1c MFr Bld: 6.1 % — ABNORMAL HIGH (ref <5.7)
Mean Plasma Glucose: 128 mg/dL — ABNORMAL HIGH (ref <117)

## 2014-04-23 ENCOUNTER — Encounter: Payer: Self-pay | Admitting: Sports Medicine

## 2014-04-23 ENCOUNTER — Ambulatory Visit (INDEPENDENT_AMBULATORY_CARE_PROVIDER_SITE_OTHER): Payer: 59 | Admitting: Sports Medicine

## 2014-04-23 VITALS — BP 174/88 | HR 62 | Ht 64.0 in | Wt 163.0 lb

## 2014-04-23 DIAGNOSIS — H6120 Impacted cerumen, unspecified ear: Secondary | ICD-10-CM | POA: Insufficient documentation

## 2014-04-23 DIAGNOSIS — H6121 Impacted cerumen, right ear: Secondary | ICD-10-CM

## 2014-04-23 NOTE — Progress Notes (Signed)
  Subjective:    CC: Followup  HPI: Ezeriah is a very pleasant 68 from a O., for the past day he has noted a foreign body sensation in his right ear. He also has difficulty hearing now. Very minimal pain. He has been using Q-tips to try and remove it. Symptoms are moderate, persistent.  Past medical history, Surgical history, Family history not pertinant except as noted below, Social history, Allergies, and medications have been entered into the medical record, reviewed, and no changes needed.   Review of Systems: No fevers, chills, night sweats, weight loss, chest pain, or shortness of breath.   Objective:    General: Well Developed, well nourished, and in no acute distress.  Neuro: Alert and oriented x3, extra-ocular muscles intact, sensation grossly intact.  HEENT: Normocephalic, atraumatic, pupils equal round reactive to light, neck supple, no masses, no lymphadenopathy, thyroid nonpalpable. Right external ear canal shows an impacted cerumen. Skin: Warm and dry, no rashes. Cardiac: Regular rate and rhythm, no murmurs rubs or gallops, no lower extremity edema.  Respiratory: Clear to auscultation bilaterally. Not using accessory muscles, speaking in full sentences.  Cerumen was flushed by nurse. Repeat inspection showed complete clearance.  Impression and Recommendations:

## 2014-04-23 NOTE — Assessment & Plan Note (Signed)
Sensation of foreign body in the right ear is actually a large piece of impacted cerumen. Cleared with irrigation. Return as needed.

## 2014-04-23 NOTE — Patient Instructions (Signed)
Impaccion De Cerumen (Cerumen Impaction) Su examen muestra que usted ha tenido una impaccin de cerumen. sto significa que el cerumen del odo se ha compactado y ha formado un tapn. Este tapon generalmente causa reduccin de la audicin; pero a veces tambin causa dolor de odo o [place]o. La extraccin de la impaccin de cerumen puede ser difcil y dolorosa, ya que el cerumen se adhiere al conducto Moose Wilson Roadauditivo, el cual es muy sensible y Psychiatristsangra fcilmente. Si usted trata de remover una gran acumulacin de cerumen del odo, utilizando un palillo de algodn, sto puede hacer que el cerumen se introduzca an ms Citigrouphacia adentro. La irrigacin con agua, succin, y el uso de pequeas curetas para el odo pueden asistir en la limpieza del cerumen. Si la impaccin se encuentra fijada a la piel del conducto Germaniaauditivo, podr ser Johnson & Johnsonnecesario usar gotas para el odo, por [id number] Roadvarios das, para Audiological scientistaflojar el cerumen. Las personas que forman mucho cerumen con frecuencia, pueden usar productos a la venta en la farmacia para removerlo. SOLICITE ATENCIN MDICA SI: Usted desarrolla un dolor de odo, aumento prdida de la audicin, o mareo pronunciado. Document Released: 07/02/2005 Document Revised: 09/24/2011 [facility] LPExitCare Patient Information 2015 [place]ExitCare, [place]. This information is not intended to replace advice given to you by your health care provider. Make sure you discuss any questions you have with your health care provider.   Cerumen Impaction A cerumen impaction is when the wax in your ear forms a plug. This plug usually causes reduced hearing. Sometimes it also causes an earache or dizziness. Removing a cerumen impaction can be difficult and painful. The wax sticks to the ear canal. The canal is sensitive and bleeds easily. If you try to remove a heavy wax buildup with a cotton tipped swab, you may push it in further. Irrigation with water, suction, and small ear curettes may be used to clear out the wax. If the impaction is fixed  to the skin in the ear canal, ear drops may be needed for a few days to loosen the wax. People who build up a lot of wax frequently can use ear wax removal products available in your local drugstore. SEEK MEDICAL CARE IF:  You develop an earache, increased hearing loss, or marked dizziness. Document Released: 08/09/2004 Document Revised: 09/24/2011 Document Reviewed: 09/29/2009 [facility] [facility]ExitCare Patient Information 2015 [place]ExitCare, [place]LL[place]. This information is not intended to replace advice given to you by your health care provider. Make sure you discuss any questions you have with your health care provider.

## 2014-06-09 ENCOUNTER — Telehealth: Payer: Self-pay | Admitting: Family Medicine

## 2014-06-09 DIAGNOSIS — I10 Essential (primary) hypertension: Secondary | ICD-10-CM

## 2014-06-09 MED ORDER — OMEPRAZOLE 40 MG PO CPDR: 40.0000 mg | DELAYED_RELEASE_CAPSULE | Freq: Every day | ORAL | Status: DC

## 2014-06-09 MED ORDER — LOSARTAN POTASSIUM 100 MG PO TABS: 100.0000 mg | ORAL_TABLET | Freq: Every day | ORAL | Status: DC

## 2014-06-09 MED ORDER — METOPROLOL SUCCINATE ER 200 MG PO TB24: 300.0000 mg | ORAL_TABLET | Freq: Every morning | ORAL | Status: DC

## 2014-06-09 MED ORDER — SIMVASTATIN 10 MG PO TABS: 10.0000 mg | ORAL_TABLET | Freq: Every day | ORAL | Status: DC

## 2014-06-09 NOTE — Telephone Encounter (Signed)
[facility]

## 2014-06-30 ENCOUNTER — Other Ambulatory Visit: Payer: Self-pay

## 2014-06-30 DIAGNOSIS — I10 Essential (primary) hypertension: Secondary | ICD-10-CM

## 2014-06-30 MED ORDER — METOPROLOL SUCCINATE ER 200 MG PO TB24: 300.0000 mg | ORAL_TABLET | Freq: Every morning | ORAL | Status: DC

## 2014-06-30 MED ORDER — OMEPRAZOLE 40 MG PO CPDR: 40.0000 mg | DELAYED_RELEASE_CAPSULE | Freq: Every day | ORAL | Status: DC

## 2014-06-30 MED ORDER — SIMVASTATIN 10 MG PO TABS: 10.0000 mg | ORAL_TABLET | Freq: Every day | ORAL | Status: DC

## 2014-06-30 MED ORDER — LOSARTAN POTASSIUM 100 MG PO TABS: 100.0000 mg | ORAL_TABLET | Freq: Every day | ORAL | Status: DC

## 2014-12-03 ENCOUNTER — Other Ambulatory Visit: Payer: Self-pay | Admitting: *Deleted

## 2014-12-03 DIAGNOSIS — I10 Essential (primary) hypertension: Secondary | ICD-10-CM

## 2014-12-03 MED ORDER — METOPROLOL SUCCINATE ER 200 MG PO TB24: 300.0000 mg | ORAL_TABLET | Freq: Every morning | ORAL | Status: DC

## 2015-02-17 ENCOUNTER — Encounter: Payer: Self-pay | Admitting: Family Medicine

## 2015-02-17 ENCOUNTER — Ambulatory Visit (INDEPENDENT_AMBULATORY_CARE_PROVIDER_SITE_OTHER): Payer: 59 | Admitting: Family Medicine

## 2015-02-17 VITALS — BP 162/95 | HR 63 | Wt 163.0 lb

## 2015-02-17 DIAGNOSIS — R7303 Prediabetes: Secondary | ICD-10-CM

## 2015-02-17 DIAGNOSIS — R7309 Other abnormal glucose: Secondary | ICD-10-CM | POA: Diagnosis not present

## 2015-02-17 DIAGNOSIS — I10 Essential (primary) hypertension: Secondary | ICD-10-CM | POA: Diagnosis not present

## 2015-02-17 DIAGNOSIS — R7989 Other specified abnormal findings of blood chemistry: Secondary | ICD-10-CM

## 2015-02-17 DIAGNOSIS — E785 Hyperlipidemia, unspecified: Secondary | ICD-10-CM

## 2015-02-17 DIAGNOSIS — R945 Abnormal results of liver function studies: Secondary | ICD-10-CM

## 2015-02-17 LAB — HEMOGLOBIN A1C
Hgb A1c MFr Bld: 6.2 % — ABNORMAL HIGH (ref <5.7)
Mean Plasma Glucose: 131 mg/dL — ABNORMAL HIGH (ref <117)

## 2015-02-17 MED ORDER — LOSARTAN POTASSIUM-HCTZ 100-25 MG PO TABS: 1.0000 | ORAL_TABLET | Freq: Every day | ORAL | Status: DC

## 2015-02-17 MED ORDER — CLOTRIMAZOLE-BETAMETHASONE 1-0.05 % EX CREA: TOPICAL_CREAM | CUTANEOUS | Status: AC

## 2015-02-17 MED ORDER — METOPROLOL SUCCINATE ER 200 MG PO TB24: 300.0000 mg | ORAL_TABLET | Freq: Every morning | ORAL | Status: DC

## 2015-02-17 NOTE — Progress Notes (Signed)
CC: Zachary Lyons is a 69 y.o. male is here for Hypertension and Medication Refill   Subjective: HPI:  Follow-up essential hypertension: Taking metoprolol and losartan on a daily basis. Denies chest pain shortness of breath orthopnea nor peripheral edema. Tried stopping metoprolol and had palpitations, these subsided after he restarted his former regimen  Follow-up hyperlipidemia: Since I saw him last he has cut back on his simvastatin due to elevated liver function tests. He denies myalgias or right upper quadrant pain.  Follow prediabetes: No formal physical exercise routine, tries to stay active at work.  Denies polyuria polyphagia nor polydipsia.   He was noted to medication other than clotrimazole that he can use for a rash in his groin. It's itchy and only mildly improved with clotrimazole.  Review Of Systems Outlined In HPI  Past Medical History  Diagnosis Date  . Hypertension   . GERD (gastroesophageal reflux disease)   . Peptic ulcer     hx. of  . Arthritis     osteoarthritis  . Transfusion history 04-22-13    15 yrs ago-"bleeding ulcer"    Past Surgical History  Procedure Laterality Date  . Nasal sinus surgery    . Stomach surgery      oversew of bleeding ulcer  . Colonoscopy    . Cataract extraction Left 04-22-13  . Total knee arthroplasty Left 04/27/2013    Procedure: LEFT TOTAL KNEE ARTHROPLASTY;  Surgeon: Loanne Drilling, MD;  Location: [facility];  Service: Orthopedics;  Laterality: Left;   No family history on file.  History   Social History  . Marital Status: Married    Spouse Name: N/A  . Number of Children: N/A  . Years of Education: N/A   Occupational History  . Not on file.   Social History Main Topics  . Smoking status: Former Smoker    Quit date: 04/23/1979  . Smokeless tobacco: Not on file  . Alcohol Use: 3.6 oz/week    6 Cans of beer per week  . Drug Use: No  . Sexual Activity:    Partners: Female   Other Topics Concern  . Not on file    Social History Narrative  . No narrative on file     Objective: BP 162/95 mmHg  Pulse 63  Wt 163 lb (73.936 kg)  General: Alert and Oriented, No Acute Distress HEENT: Pupils equal, round, reactive to light. Conjunctivae clear.  Moist mucous membranes and pharynx unremarkable Lungs: Clear to auscultation bilaterally, no wheezing/ronchi/rales.  Comfortable work of breathing. Good air movement. Cardiac: Regular rate and rhythm. Normal S1/S2.  No murmurs, rubs, nor gallops.   Extremities: No peripheral edema.  Strong peripheral pulses.  Mental Status: No depression, anxiety, nor agitation. Skin: Warm and dry.  Assessment & Plan: Zachary Lyons was seen today for hypertension and medication refill.  Diagnoses and all orders for this visit:  Essential hypertension, benign Orders: -     metoprolol (TOPROL XL) 200 MG 24 hr tablet; Take 1.5 tablets (300 mg total) by mouth every morning. Takes 1 and 1/2  Hyperlipidemia Orders: -     Lipid panel  Elevated LFTs Orders: -     COMPLETE METABOLIC PANEL WITH GFR  Prediabetes Orders: -     Hemoglobin A1c -     COMPLETE METABOLIC PANEL WITH GFR  Other orders -     clotrimazole-betamethasone (LOTRISONE) cream; Apply to affected area twice a day for two weeks, may require four weeks if involving the feet/toes. -  losartan-hydrochlorothiazide (HYZAAR) 100-25 MG per tablet; Take 1 tablet by mouth daily.   Essential hypertension: Uncontrolled chronic condition adding hydrochlorothiazide to losartan and metoprolol Hyperlipidemia: Due for lipid panel, continue Zocor pending results Elevated LFTs: Repeating liver function today Prediabetes: Clinical controlled but due for repeat A1c  Return in about 3 months (around 05/20/2015) for Blood Pressure Recheck.

## 2015-02-18 ENCOUNTER — Telehealth: Payer: Self-pay | Admitting: Family Medicine

## 2015-02-18 LAB — COMPLETE METABOLIC PANEL WITH GFR
ALBUMIN: 4.3 g/dL (ref 3.6–5.1)
ALT: 33 U/L (ref 9–46)
AST: 48 U/L — ABNORMAL HIGH (ref 10–35)
Alkaline Phosphatase: 109 U/L (ref 40–115)
BUN: 12 mg/dL (ref 7–25)
CHLORIDE: 103 mmol/L (ref 98–110)
CO2: 27 mmol/L (ref 20–31)
Calcium: 9.4 mg/dL (ref 8.6–10.3)
Creat: 1.24 mg/dL (ref 0.70–1.25)
GFR, EST AFRICAN AMERICAN: 68 mL/min (ref >=60)
GFR, Est Non African American: 59 mL/min — ABNORMAL LOW (ref >=60)
GLUCOSE: 84 mg/dL (ref 65–99)
POTASSIUM: 4 mmol/L (ref 3.5–5.3)
SODIUM: 143 mmol/L (ref 135–146)
TOTAL PROTEIN: 7.5 g/dL (ref 6.1–8.1)
Total Bilirubin: 1.2 mg/dL (ref 0.2–1.2)

## 2015-02-18 LAB — LIPID PANEL
CHOL/HDL RATIO: 3 ratio (ref <=5.0)
Cholesterol: 185 mg/dL (ref 125–200)
HDL: 62 mg/dL (ref >=40)
LDL CALC: 97 mg/dL (ref <130)
Triglycerides: 131 mg/dL (ref <150)
VLDL: 26 mg/dL (ref <30)

## 2015-02-18 MED ORDER — OMEPRAZOLE 40 MG PO CPDR: 40.0000 mg | DELAYED_RELEASE_CAPSULE | Freq: Every day | ORAL | Status: DC

## 2015-02-18 MED ORDER — SIMVASTATIN 10 MG PO TABS: 10.0000 mg | ORAL_TABLET | Freq: Every day | ORAL | Status: DC

## 2015-02-18 NOTE — Telephone Encounter (Signed)
Sue Lush, Will you please let patient know that his cholesterol is under control. His liver function appears to be improving with the lower dose of his cholesterol medication.  There is a mild degree of liver inflammation therefore I'd recommend cutting back on his alcohol consumption.  His three month average blood sugar is in the prediabetic range but fortunately this does not require any medication.  This can be improved with engaging in 30-45 minutes of moderate exercise most days of the week. I would recommend f/u in three months.

## 2015-02-18 NOTE — Telephone Encounter (Signed)
Left messsage on vm with results

## 2015-06-21 ENCOUNTER — Encounter: Payer: Self-pay | Admitting: Family Medicine

## 2015-06-21 ENCOUNTER — Ambulatory Visit (INDEPENDENT_AMBULATORY_CARE_PROVIDER_SITE_OTHER): Payer: 59 | Admitting: Family Medicine

## 2015-06-21 VITALS — BP 169/97 | HR 54 | Wt 157.0 lb

## 2015-06-21 DIAGNOSIS — R7303 Prediabetes: Secondary | ICD-10-CM | POA: Diagnosis not present

## 2015-06-21 DIAGNOSIS — I10 Essential (primary) hypertension: Secondary | ICD-10-CM

## 2015-06-21 DIAGNOSIS — R7309 Other abnormal glucose: Secondary | ICD-10-CM

## 2015-06-21 MED ORDER — ZOSTER VACCINE LIVE 19400 UNT/0.65ML ~~LOC~~ SOLR: 0.6500 mL | Freq: Once | SUBCUTANEOUS | Status: DC

## 2015-06-21 NOTE — Progress Notes (Signed)
CC: Zachary Lyons is a 69 y.o. male is here for No chief complaint on file.   Subjective: HPI:  Follow-up prediabetes: Symptoms unless he stopped eating his daily cookie regimen. blood sugars report. limited in physical activity due to chronic right knee pain which is currently getting haluronic acid injections.  Denies polyuria polyphagia polydipsia or poorly healing wounds.  Follow-up essential hypertension: Taking metoprolol twice a day as prescribed. He was cutting back on losartan-hydrochlorothiazide to see if he still needs to be on it. He and his wife believe that he was able to stop this medication  and still normotensive readings at home. They've not checked his blood pressure in over 2 months now. He denies chest pain shortness of breath orthopnea nor peripheral edema.  Review Of Systems Outlined In HPI  Past Medical History  Diagnosis Date  . Hypertension   . GERD (gastroesophageal reflux disease)   . Peptic ulcer     hx. of  . Arthritis     osteoarthritis  . Transfusion history 04-22-13    15 yrs ago-"bleeding ulcer"    Past Surgical History  Procedure Laterality Date  . Nasal sinus surgery    . Stomach surgery      oversew of bleeding ulcer  . Colonoscopy    . Cataract extraction Left 04-22-13  . Total knee arthroplasty Left 04/27/2013    Procedure: LEFT TOTAL KNEE ARTHROPLASTY;  Surgeon: Loanne DrillingFrank V Aluisio, MD;  Location: [facility];  Service: Orthopedics;  Laterality: Left;   No family history on file.  Social History   Social History  . Marital Status: Married    Spouse Name: N/A  . Number of Children: N/A  . Years of Education: N/A   Occupational History  . Not on file.   Social History Main Topics  . Smoking status: Former Smoker    Quit date: 04/23/1979  . Smokeless tobacco: Not on file  . Alcohol Use: 3.6 oz/week    6 Cans of beer per week  . Drug Use: No  . Sexual Activity:    Partners: Female   Other Topics Concern  . Not on file   Social History  Narrative     Objective: BP 169/97 mmHg  Pulse 54  Wt 157 lb (71.215 kg)  General: Alert and Oriented, No Acute Distress HEENT: Pupils equal, round, reactive to light. Conjunctivae clear.  Moist mucous membranes pharynx unremarkable Lungs: Clear to auscultation bilaterally, no wheezing/ronchi/rales.  Comfortable work of breathing. Good air movement. Cardiac: Regular rate and rhythm. Normal S1/S2.  No murmurs, rubs, nor gallops.   Extremities: No peripheral edema.  Strong peripheral pulses.  Mental Status: No depression, anxiety, nor agitation. Skin: Warm and dry.  Assessment & Plan: Diagnoses and all orders for this visit:  Other abnormal glucose -     POCT HgB A1C  Essential hypertension, benign  Prediabetes  Other orders -     zoster vaccine live, PF, (ZOSTAVAX) [id number] UNT/0.65ML injection; Inject 19,400 Units into the skin once.   Prediabetes: Controlled with A1c of 6.3, continue to try to limit carbohydrate intake. Essential hypertension: Uncontrolled chronic condition, restart lisinopril-hydrochlorothiazide and recheck blood pressure daily for the next week. I've given him a piece of paper to write down these values to submit for my review next week.  Return in about 3 months (around 09/19/2015).

## 2015-06-21 NOTE — Patient Instructions (Signed)
Daily Blood Pressure for one week:                  .

## 2015-08-29 ENCOUNTER — Other Ambulatory Visit: Payer: Self-pay

## 2015-08-29 DIAGNOSIS — I10 Essential (primary) hypertension: Secondary | ICD-10-CM

## 2015-08-29 MED ORDER — METOPROLOL SUCCINATE ER 200 MG PO TB24: 300.0000 mg | ORAL_TABLET | Freq: Every morning | ORAL | Status: DC

## 2015-09-09 ENCOUNTER — Other Ambulatory Visit: Payer: Self-pay

## 2015-09-09 DIAGNOSIS — I10 Essential (primary) hypertension: Secondary | ICD-10-CM

## 2015-09-09 MED ORDER — OMEPRAZOLE 40 MG PO CPDR: 40.0000 mg | DELAYED_RELEASE_CAPSULE | Freq: Every day | ORAL | Status: DC

## 2015-09-09 MED ORDER — METOPROLOL SUCCINATE ER 200 MG PO TB24: 300.0000 mg | ORAL_TABLET | Freq: Every morning | ORAL | Status: DC

## 2015-09-14 ENCOUNTER — Encounter: Payer: Self-pay | Admitting: Family Medicine

## 2015-09-14 ENCOUNTER — Ambulatory Visit (INDEPENDENT_AMBULATORY_CARE_PROVIDER_SITE_OTHER): Payer: Medicare HMO | Admitting: Family Medicine

## 2015-09-14 VITALS — BP 153/83 | HR 54 | Wt 157.0 lb

## 2015-09-14 DIAGNOSIS — H9193 Unspecified hearing loss, bilateral: Secondary | ICD-10-CM

## 2015-09-14 DIAGNOSIS — I10 Essential (primary) hypertension: Secondary | ICD-10-CM | POA: Diagnosis not present

## 2015-09-14 DIAGNOSIS — E785 Hyperlipidemia, unspecified: Secondary | ICD-10-CM

## 2015-09-14 MED ORDER — VALSARTAN-HYDROCHLOROTHIAZIDE 160-25 MG PO TABS: 1.0000 | ORAL_TABLET | Freq: Every day | ORAL | Status: DC

## 2015-09-14 MED ORDER — METOPROLOL SUCCINATE ER 200 MG PO TB24: 300.0000 mg | ORAL_TABLET | Freq: Every morning | ORAL | Status: DC

## 2015-09-14 MED ORDER — CLOTRIMAZOLE-BETAMETHASONE 1-0.05 % EX CREA: TOPICAL_CREAM | CUTANEOUS | Status: AC

## 2015-09-14 MED ORDER — OMEPRAZOLE 40 MG PO CPDR
40.0000 mg | DELAYED_RELEASE_CAPSULE | Freq: Every day | ORAL | Status: DC
Start: 2015-09-14 — End: 2016-07-03

## 2015-09-14 MED ORDER — SIMVASTATIN 10 MG PO TABS: 10.0000 mg | ORAL_TABLET | Freq: Every day | ORAL | Status: DC

## 2015-09-14 NOTE — Progress Notes (Signed)
CC: Zachary Lyons is a 70 y.o. male is here for Hypertension and Medication Refill   Subjective: HPI:  Follow-up essential hypertension: Since starting losartan-hydrochlorothiazide he's not noticed any new side effects. Outside blood pressures are just barely in the stage I hypertension range. Still takes metoprolol 100% compliance. No chest pain shortness of breath orthopnea nor peripheral edema.  His requesting a refill of atorvastatin. No right upper quadrant pain or myalgia. Denies lymph claudication.  His children are claiming that he is acting like he's lost some of his hearing. He denies any subjective hearing loss. He is not sure how long this has been going on.   Review Of Systems Outlined In HPI  Past Medical History  Diagnosis Date  . Hypertension   . GERD (gastroesophageal reflux disease)   . Peptic ulcer     hx. of  . Arthritis     osteoarthritis  . Transfusion history 04-22-13    15 yrs ago-"bleeding ulcer"    Past Surgical History  Procedure Laterality Date  . Nasal sinus surgery    . Stomach surgery      oversew of bleeding ulcer  . Colonoscopy    . Cataract extraction Left 04-22-13  . Total knee arthroplasty Left 04/27/2013    Procedure: LEFT TOTAL KNEE ARTHROPLASTY;  Surgeon: Loanne Drilling, MD;  Location: [facility];  Service: Orthopedics;  Laterality: Left;   No family history on file.  Social History   Social History  . Marital Status: Married    Spouse Name: N/A  . Number of Children: N/A  . Years of Education: N/A   Occupational History  . Not on file.   Social History Main Topics  . Smoking status: Former Smoker    Quit date: 04/23/1979  . Smokeless tobacco: Not on file  . Alcohol Use: 3.6 oz/week    6 Cans of beer per week  . Drug Use: No  . Sexual Activity:    Partners: Female   Other Topics Concern  . Not on file   Social History Narrative     Objective: BP 153/83 mmHg  Pulse 54  Wt 157 lb (71.215 kg)  General: Alert and  Oriented, No Acute Distress HEENT: Pupils equal, round, reactive to light. Conjunctivae clear.  External ears unremarkable, canals clear with intact TMs with appropriate landmarks.  Middle ear appears open without effusion. Pink inferior turbinates.  Moist mucous membranes, pharynx without inflammation nor lesions.  Neck supple without palpable lymphadenopathy nor abnormal masses. Lungs: Clear to auscultation bilaterally, no wheezing/ronchi/rales.  Comfortable work of breathing. Good air movement. Cardiac: Regular rate and rhythm. Normal S1/S2.  No murmurs, rubs, nor gallops.   Extremities: No peripheral edema.  Strong peripheral pulses.  Mental Status: No depression, anxiety, nor agitation. Skin: Warm and dry.  Assessment & Plan: [name] was seen today for hypertension and medication refill.  Diagnoses and all orders for this visit:  Essential hypertension, benign -     metoprolol (TOPROL XL) 200 MG 24 hr tablet; Take 1.5 tablets (300 mg total) by mouth every morning. Takes 1 and 1/2  Hyperlipidemia  Hearing loss, bilateral  Other orders -     valsartan-hydrochlorothiazide (DIOVAN HCT) 160-25 MG tablet; Take 1 tablet by mouth daily. Please discontinue losartan-hctz. -     clotrimazole-betamethasone (LOTRISONE) cream; Apply to affected area twice a day for two weeks, may require four weeks if involving the feet/toes. -     omeprazole (PRILOSEC) 40 MG capsule; Take 1 capsule (40 mg  total) by mouth daily. -     simvastatin (ZOCOR) 10 MG tablet; Take 1 tablet (10 mg total) by mouth at bedtime.   Essential hypertension: Uncontrolled chronic condition continue current dose of metoprolol and adding Diovan HCT. Hyperlipidemia: Due for repeat blood work this summer, for the meantime continue on  simvastatin  hearing loss: Mild in severity, he'll think aboutFormal audiology in the future for possible hearing aids if the symptoms ever bother him.  Refills of medications had to be transferred to a  new mail order pharmacy   Return in about 3 months (around 12/15/2015) for Blood Pressure and Sugar.

## 2016-04-09 ENCOUNTER — Encounter: Payer: Self-pay | Admitting: Osteopathic Medicine

## 2016-04-09 ENCOUNTER — Ambulatory Visit (INDEPENDENT_AMBULATORY_CARE_PROVIDER_SITE_OTHER): Payer: Medicare HMO | Admitting: Osteopathic Medicine

## 2016-04-09 VITALS — BP 171/91 | HR 59 | Ht 64.0 in | Wt 161.0 lb

## 2016-04-09 DIAGNOSIS — H6123 Impacted cerumen, bilateral: Secondary | ICD-10-CM

## 2016-04-09 DIAGNOSIS — H9193 Unspecified hearing loss, bilateral: Secondary | ICD-10-CM

## 2016-04-09 DIAGNOSIS — N529 Male erectile dysfunction, unspecified: Secondary | ICD-10-CM

## 2016-04-09 DIAGNOSIS — R7303 Prediabetes: Secondary | ICD-10-CM

## 2016-04-09 DIAGNOSIS — I10 Essential (primary) hypertension: Secondary | ICD-10-CM | POA: Diagnosis not present

## 2016-04-09 LAB — POCT GLYCOSYLATED HEMOGLOBIN (HGB A1C): Hemoglobin A1C: 6.2

## 2016-04-09 MED ORDER — VARDENAFIL HCL 10 MG PO TABS: 10.0000 mg | ORAL_TABLET | Freq: Every day | ORAL | 0 refills | Status: DC | PRN

## 2016-04-09 MED ORDER — VALSARTAN-HYDROCHLOROTHIAZIDE 160-25 MG PO TABS: 1.0000 | ORAL_TABLET | Freq: Every day | ORAL | 1 refills | Status: DC

## 2016-04-09 NOTE — Patient Instructions (Signed)
Ears: Debrox drops or mineral oil

## 2016-04-09 NOTE — Progress Notes (Signed)
HPI: Zachary Lyons is a 70 y.o. male  who presents to [facility] today, 04/09/16,  for chief complaint of:  Chief Complaint  Patient presents with  . Follow-up    BLOOD PRESSURE     Hypertension: Poorly controlled. Patient has been out of medications. Wife reports that there was a problem getting refills, she did not ask pharmacy/when she did they said that she needed to talk to her doctor's office, she states that she had trouble reaching anybody on the phone over here, came to reception desk when day and was told would need appointment. Patient has been out of medications. No chest pain, shortness of breath, headache, dizziness  Erectile dysfunction: Patient requests refill of Levitra. I don't see this on his medication list that he does have the pills with him right now.  Prediabetes: A1c as noted below, patient compliant with low carb diet  Earwax: Patient states that he occasionally uses Q-tips to clean out ears but has noticed hasn't done much help, he is worried that he may have punctured his eardrums.  Patient is accompanied by wife who assists with history-taking.   Past medical, surgical, social and family history reviewed: Past Medical History:  Diagnosis Date  . Arthritis    osteoarthritis  . GERD (gastroesophageal reflux disease)   . Hypertension   . Peptic ulcer    hx. of  . Transfusion history 04-22-13   15 yrs ago-"bleeding ulcer"   Past Surgical History:  Procedure Laterality Date  . CATARACT EXTRACTION Left 04-22-13  . COLONOSCOPY    . NASAL SINUS SURGERY    . STOMACH SURGERY     oversew of bleeding ulcer  . TOTAL KNEE ARTHROPLASTY Left 04/27/2013   Procedure: LEFT TOTAL KNEE ARTHROPLASTY;  Surgeon: Loanne DrillingFrank V Aluisio, MD;  Location: [facility];  Service: Orthopedics;  Laterality: Left;   Social History  Substance Use Topics  . Smoking status: Former Smoker    Quit date: 04/23/1979  . Smokeless tobacco: Not on file  . Alcohol  use 3.6 oz/week    6 Cans of beer per week   No family history on file.   Current medication list and allergy/intolerance information reviewed:   Current Outpatient Prescriptions  Medication Sig Dispense Refill  . metoprolol (TOPROL XL) 200 MG 24 hr tablet Take 1.5 tablets (300 mg total) by mouth every morning. Takes 1 and 1/2 180 tablet 2  . omeprazole (PRILOSEC) 40 MG capsule Take 1 capsule (40 mg total) by mouth daily. 90 capsule 2  . simvastatin (ZOCOR) 10 MG tablet Take 1 tablet (10 mg total) by mouth at bedtime. 90 tablet 2  . valsartan-hydrochlorothiazide (DIOVAN HCT) 160-25 MG tablet Take 1 tablet by mouth daily. Please discontinue losartan-hctz. 90 tablet 1  . zoster vaccine live, PF, (ZOSTAVAX) [id number] UNT/0.65ML injection Inject 19,400 Units into the skin once. 1 each 0   No current facility-administered medications for this visit.    No Known Allergies    Review of Systems:  Constitutional:  No  fever, no chills, No recent illness,  HEENT: No  headache, no vision change  Cardiac: No  chest pain, No  pressure, No palpitations  Respiratory:  No  shortness of breath.  Gastrointestinal: No  abdominal pain, No  nausea,   Musculoskeletal: No new myalgia/arthralgia  Skin: No  Rash,  Neurologic: No  weakness, No  dizziness,  Psychiatric: No  concerns with depression, No  concerns with anxiety,  Exam:  BP (!) 171/91  Pulse (!) 59   Ht 5\' 4"  (1.626 m)   Wt 161 lb (73 kg)   BMI 27.64 kg/m   Constitutional: VS see above. General Appearance: alert, well-developed, well-nourished, NAD  Eyes: Normal lids and conjunctive, non-icteric sclera  Ears, Nose, Mouth, Throat: MMM, Normal external inspection ears/nares/mouth/lips/gums. TM normal bilaterally After subsequent examination post-flushing, wax was significantly stuck to right tympanic membrane. Both TMs however appear normal, no perforation that I can see. Pharynx/tonsils no erythema, no exudate. Nasal mucosa normal.    Neck: No masses, trachea midline. No thyroid enlargement. No tenderness/mass appreciated. No lymphadenopathy  Respiratory: Normal respiratory effort. no wheeze, no rhonchi, no rales  Cardiovascular: S1/S2 normal, no murmur, no rub/gallop auscultated. RRR.   Gastrointestinal: Nontender, no masses. No hepatomegaly, no splenomegaly. No hernia appreciated. Bowel sounds normal. Rectal exam deferred.   Musculoskeletal: Gait normal. No clubbing/cyanosis of digits.   Neurological:  Normal balance/coordination. No tremor.   Skin: warm, dry, intact. No rash/ulcer  Psychiatric: Normal judgment/insight. Normal mood and affect. Oriented x3.    No results found for this or any previous visit (from the past 72 hour(s)).  No results found.  [name]: Previous A1c no in diabetic range. Due for routine lipid screening/CMP  ASSESSMENT/PLAN:  Essential hypertension - Restart medications, follow-up for blood pressure check in 2 weeks - Plan: CBC with Differential/Platelet, COMPLETE METABOLIC PANEL WITH GFR, Lipid panel, valsartan-hydrochlorothiazide (DIOVAN HCT) 160-25 MG tablet  Pre-diabetes - Stable, continue 6 month monitoring A1c - Plan: POCT HgB A1C  Hearing loss, bilateral - Old diagnoses, patient declines audiology referral at this point  Cerumen impaction, bilateral - see printed instructions, avoid Q-tips in the future  Erectile dysfunction, unspecified erectile dysfunction type - Plan: vardenafil (LEVITRA) 10 MG tablet     Visit summary with medication list and pertinent instructions was printed for patient to review. All questions at time of visit were answered - patient instructed to contact office with any additional concerns. ER/RTC precautions were reviewed with the patient. Follow-up plan: Return in about 2 weeks (around 04/23/2016) for blood pressure checkup.

## 2016-04-10 LAB — COMPLETE METABOLIC PANEL WITH GFR
ALT: 23 U/L (ref 9–46)
AST: 38 U/L — ABNORMAL HIGH (ref 10–35)
Albumin: 4.3 g/dL (ref 3.6–5.1)
Alkaline Phosphatase: 93 U/L (ref 40–115)
BILIRUBIN TOTAL: 0.8 mg/dL (ref 0.2–1.2)
BUN: 10 mg/dL (ref 7–25)
CHLORIDE: 103 mmol/L (ref 98–110)
CO2: 26 mmol/L (ref 20–31)
Calcium: 9.3 mg/dL (ref 8.6–10.3)
Creat: 1.05 mg/dL (ref 0.70–1.18)
GFR, Est African American: 83 mL/min (ref >=60)
GFR, Est Non African American: 72 mL/min (ref >=60)
GLUCOSE: 102 mg/dL — AB (ref 65–99)
POTASSIUM: 3.8 mmol/L (ref 3.5–5.3)
Sodium: 139 mmol/L (ref 135–146)
TOTAL PROTEIN: 7.4 g/dL (ref 6.1–8.1)

## 2016-04-10 LAB — CBC WITH DIFFERENTIAL/PLATELET
BASOS ABS: 0 {cells}/uL (ref 0–200)
BASOS PCT: 0 %
EOS ABS: 365 {cells}/uL (ref 15–500)
Eosinophils Relative: 5 %
HEMATOCRIT: 45.2 % (ref 38.5–50.0)
HEMOGLOBIN: 15.3 g/dL (ref 13.2–17.1)
Lymphocytes Relative: 35 %
Lymphs Abs: 2555 cells/uL (ref 850–3900)
MCH: 33.7 pg — AB (ref 27.0–33.0)
MCHC: 33.8 g/dL (ref 32.0–36.0)
MCV: 99.6 fL (ref 80.0–100.0)
MONO ABS: 657 {cells}/uL (ref 200–950)
MPV: 8.9 fL (ref 7.5–12.5)
Monocytes Relative: 9 %
NEUTROS ABS: 3723 {cells}/uL (ref 1500–7800)
Neutrophils Relative %: 51 %
Platelets: 127 10*3/uL — ABNORMAL LOW (ref 140–400)
RBC: 4.54 MIL/uL (ref 4.20–5.80)
RDW: 14.1 % (ref 11.0–15.0)
WBC: 7.3 10*3/uL (ref 3.8–10.8)

## 2016-04-10 LAB — LIPID PANEL
CHOL/HDL RATIO: 2.8 ratio (ref <=5.0)
Cholesterol: 193 mg/dL (ref 125–200)
HDL: 68 mg/dL (ref >=40)
LDL CALC: 89 mg/dL (ref <130)
Triglycerides: 179 mg/dL — ABNORMAL HIGH (ref <150)
VLDL: 36 mg/dL — ABNORMAL HIGH (ref <30)

## 2016-04-23 ENCOUNTER — Ambulatory Visit (INDEPENDENT_AMBULATORY_CARE_PROVIDER_SITE_OTHER): Payer: Medicare HMO | Admitting: Osteopathic Medicine

## 2016-04-23 ENCOUNTER — Encounter: Payer: Self-pay | Admitting: Osteopathic Medicine

## 2016-04-23 VITALS — BP 145/75 | HR 56 | Ht 64.0 in | Wt 163.0 lb

## 2016-04-23 DIAGNOSIS — R7303 Prediabetes: Secondary | ICD-10-CM

## 2016-04-23 DIAGNOSIS — D696 Thrombocytopenia, unspecified: Secondary | ICD-10-CM | POA: Diagnosis not present

## 2016-04-23 DIAGNOSIS — I10 Essential (primary) hypertension: Secondary | ICD-10-CM

## 2016-04-23 NOTE — Progress Notes (Signed)
HPI: Zachary Lyons is a 70 y.o. male  who presents to [place] today, 04/24/16,  for chief complaint of:  Chief Complaint  Patient presents with  . Follow-up    BLOOD PRESSURE     Hypertension: Control is improved not patient is back on his medications.  Prediabetes: A1c as noted below, patient compliant with low carb diet  Patient is accompanied by wife who assists with history-taking.   Past medical, surgical, social and family history reviewed: Past Medical History:  Diagnosis Date  . Arthritis    osteoarthritis  . GERD (gastroesophageal reflux disease)   . Hypertension   . Peptic ulcer    hx. of  . Transfusion history 04-22-13   15 yrs ago-"bleeding ulcer"   Past Surgical History:  Procedure Laterality Date  . CATARACT EXTRACTION Left 04-22-13  . COLONOSCOPY    . NASAL SINUS SURGERY    . STOMACH SURGERY     oversew of bleeding ulcer  . TOTAL KNEE ARTHROPLASTY Left 04/27/2013   Procedure: LEFT TOTAL KNEE ARTHROPLASTY;  Surgeon: Gearlean Alf, MD;  Location: [facility];  Service: Orthopedics;  Laterality: Left;   Social History  Substance Use Topics  . Smoking status: Former Smoker    Quit date: 04/23/1979  . Smokeless tobacco: Not on file  . Alcohol use 3.6 oz/week    6 Cans of beer per week   No family history on file.   Current medication list and allergy/intolerance information reviewed:   Current Outpatient Prescriptions  Medication Sig Dispense Refill  . metoprolol (TOPROL XL) 200 MG 24 hr tablet Take 1.5 tablets (300 mg total) by mouth every morning. Takes 1 and 1/2 180 tablet 2  . omeprazole (PRILOSEC) 40 MG capsule Take 1 capsule (40 mg total) by mouth daily. 90 capsule 2  . simvastatin (ZOCOR) 10 MG tablet Take 1 tablet (10 mg total) by mouth at bedtime. 90 tablet 2  . valsartan-hydrochlorothiazide (DIOVAN HCT) 160-25 MG tablet Take 1 tablet by mouth daily. 30 tablet 1  . vardenafil (LEVITRA) 10 MG tablet Take 1  tablet (10 mg total) by mouth daily as needed for erectile dysfunction. 10 tablet 0  . zoster vaccine live, PF, (ZOSTAVAX) [id number] UNT/0.65ML injection Inject 19,400 Units into the skin once. 1 each 0   No current facility-administered medications for this visit.    No Known Allergies    Review of Systems:  Constitutional:  No  fever, no chills, No recent illness,  HEENT: No  headache, no vision change  Cardiac: No  chest pain, No  pressure, No palpitations  Respiratory:  No  shortness of breath.  Neurologic: No  weakness, No  dizziness,   Exam:  BP (!) 145/75   Pulse (!) 56   Ht '5\' 4"'  (1.626 m)   Wt 163 lb (73.9 kg)   BMI 27.98 kg/m   Constitutional: VS see above. General Appearance: alert, well-developed, well-nourished, NAD  Neck: No masses, trachea midline.  Respiratory: Normal respiratory effort. no wheeze, no rhonchi, no rales  Cardiovascular: S1/S2 normal, no murmur, no rub/gallop auscultated. RRR.   Musculoskeletal: Gait normal.   Neurological:  Normal balance/coordination. No tremor.   Skin: warm, dry, intact. No rash/ulcer  Psychiatric: Normal judgment/insight. Normal mood and affect. Oriented x3.   Recent Results (from the past 2160 hour(s))  POCT HgB A1C     Status: None   Collection Time: 04/09/16  3:58 PM  Result Value Ref Range   Hemoglobin A1C  6.2   CBC with Differential/Platelet     Status: Abnormal   Collection Time: 04/09/16  4:27 PM  Result Value Ref Range   WBC 7.3 3.8 - 10.8 K/uL   RBC 4.54 4.20 - 5.80 MIL/uL   Hemoglobin 15.3 13.2 - 17.1 g/dL   HCT 45.2 38.5 - 50.0 %   MCV 99.6 80.0 - 100.0 fL   MCH 33.7 (H) 27.0 - 33.0 pg   MCHC 33.8 32.0 - 36.0 g/dL   RDW 14.1 11.0 - 15.0 %   Platelets 127 (L) 140 - 400 K/uL   MPV 8.9 7.5 - 12.5 fL   Neutro Abs 3,723 1,500 - 7,800 cells/uL   Lymphs Abs 2,555 850 - 3,900 cells/uL   Monocytes Absolute 657 200 - 950 cells/uL   Eosinophils Absolute 365 15 - 500 cells/uL   Basophils Absolute 0 0 -  200 cells/uL   Neutrophils Relative % 51 %   Lymphocytes Relative 35 %   Monocytes Relative 9 %   Eosinophils Relative 5 %   Basophils Relative 0 %   Smear Review Criteria for review not met   COMPLETE METABOLIC PANEL WITH GFR     Status: Abnormal   Collection Time: 04/09/16  4:27 PM  Result Value Ref Range   Sodium 139 135 - 146 mmol/L   Potassium 3.8 3.5 - 5.3 mmol/L   Chloride 103 98 - 110 mmol/L   CO2 26 20 - 31 mmol/L   Glucose, Bld 102 (H) 65 - 99 mg/dL   BUN 10 7 - 25 mg/dL   Creat 1.05 0.70 - 1.18 mg/dL    Comment:   For patients > or = 70 years of age: The upper reference limit for Creatinine is approximately 13% higher for people identified as African-American.      Total Bilirubin 0.8 0.2 - 1.2 mg/dL   Alkaline Phosphatase 93 40 - 115 U/L   AST 38 (H) 10 - 35 U/L   ALT 23 9 - 46 U/L   Total Protein 7.4 6.1 - 8.1 g/dL   Albumin 4.3 3.6 - 5.1 g/dL   Calcium 9.3 8.6 - 10.3 mg/dL   GFR, Est African American 83 >=60 mL/min   GFR, Est Non African American 72 >=60 mL/min  Lipid panel     Status: Abnormal   Collection Time: 04/09/16  4:27 PM  Result Value Ref Range   Cholesterol 193 125 - 200 mg/dL   Triglycerides 179 (H) <150 mg/dL   HDL 68 >=40 mg/dL   Total CHOL/HDL Ratio 2.8 <=5.0 Ratio   VLDL 36 (H) <30 mg/dL   LDL Cholesterol 89 <130 mg/dL    Comment:   Total Cholesterol/HDL Ratio:CHD Risk                        Coronary Heart Disease Risk Table                                        Men       Women          1/2 Average Risk              3.4        3.3              Average Risk  5.0        4.4           2X Average Risk              9.6        7.1           3X Average Risk             23.4       11.0 Use the calculated Patient Ratio above and the CHD Risk table  to determine the patient's CHD Risk.      ASSESSMENT/PLAN:  Essential hypertension  Pre-diabetes     Visit summary with medication list and pertinent instructions was printed  for patient to review. All questions at time of visit were answered - patient instructed to contact office with any additional concerns. ER/RTC precautions were reviewed with the patient. Follow-up plan: Return in about 6 months (around 10/22/2016) for BLOOD PRESSURE AND RECHECK A1C.

## 2016-04-24 DIAGNOSIS — D696 Thrombocytopenia, unspecified: Secondary | ICD-10-CM | POA: Insufficient documentation

## 2016-05-14 ENCOUNTER — Other Ambulatory Visit: Payer: Self-pay

## 2016-05-14 DIAGNOSIS — I10 Essential (primary) hypertension: Secondary | ICD-10-CM

## 2016-05-14 MED ORDER — VALSARTAN-HYDROCHLOROTHIAZIDE 160-25 MG PO TABS: 1.0000 | ORAL_TABLET | Freq: Every day | ORAL | 1 refills | Status: DC

## 2016-07-03 ENCOUNTER — Other Ambulatory Visit: Payer: Self-pay

## 2016-07-03 MED ORDER — OMEPRAZOLE 40 MG PO CPDR: 40.0000 mg | DELAYED_RELEASE_CAPSULE | Freq: Every day | ORAL | 2 refills | Status: DC

## 2016-07-03 NOTE — Telephone Encounter (Signed)
Patient request refill for Omeprazole 40 mg. #90 2 refills sent to pharmacy. Rhonda Cunningham,CMA

## 2016-09-17 ENCOUNTER — Telehealth: Payer: Self-pay

## 2016-09-17 DIAGNOSIS — I10 Essential (primary) hypertension: Secondary | ICD-10-CM

## 2016-09-17 MED ORDER — VALSARTAN-HYDROCHLOROTHIAZIDE 160-25 MG PO TABS: 1.0000 | ORAL_TABLET | Freq: Every day | ORAL | 0 refills | Status: DC

## 2016-09-17 MED ORDER — METOPROLOL SUCCINATE ER 200 MG PO TB24: 300.0000 mg | ORAL_TABLET | Freq: Every morning | ORAL | 0 refills | Status: DC

## 2016-09-17 MED ORDER — SIMVASTATIN 10 MG PO TABS: 10.0000 mg | ORAL_TABLET | Freq: Every day | ORAL | 0 refills | Status: DC

## 2016-09-17 MED ORDER — OMEPRAZOLE 40 MG PO CPDR: 40.0000 mg | DELAYED_RELEASE_CAPSULE | Freq: Every day | ORAL | 0 refills | Status: DC

## 2016-09-17 NOTE — Telephone Encounter (Signed)
Refill request for Simvastatin 10 mg, Metoprolol 200 mg, Omeprazole 40 mg, Valsartan 160-25 mg. Patient advised that appointment is needed for further refills. Rhonda Cunningham,CMA

## 2016-09-25 ENCOUNTER — Other Ambulatory Visit: Payer: Self-pay

## 2016-09-25 DIAGNOSIS — I10 Essential (primary) hypertension: Secondary | ICD-10-CM

## 2016-09-25 MED ORDER — SIMVASTATIN 10 MG PO TABS: 10.0000 mg | ORAL_TABLET | Freq: Every day | ORAL | 0 refills | Status: DC

## 2016-09-25 MED ORDER — VALSARTAN-HYDROCHLOROTHIAZIDE 160-25 MG PO TABS: 1.0000 | ORAL_TABLET | Freq: Every day | ORAL | 0 refills | Status: DC

## 2016-09-25 MED ORDER — OMEPRAZOLE 40 MG PO CPDR: 40.0000 mg | DELAYED_RELEASE_CAPSULE | Freq: Every day | ORAL | 0 refills | Status: DC

## 2016-09-25 MED ORDER — METOPROLOL SUCCINATE ER 200 MG PO TB24: 300.0000 mg | ORAL_TABLET | Freq: Every morning | ORAL | 0 refills | Status: DC

## 2016-10-08 ENCOUNTER — Ambulatory Visit (INDEPENDENT_AMBULATORY_CARE_PROVIDER_SITE_OTHER): Payer: Medicare HMO | Admitting: Osteopathic Medicine

## 2016-10-08 ENCOUNTER — Encounter: Payer: Self-pay | Admitting: Osteopathic Medicine

## 2016-10-08 VITALS — BP 139/81 | HR 72 | Ht 63.0 in | Wt 163.0 lb

## 2016-10-08 DIAGNOSIS — M25579 Pain in unspecified ankle and joints of unspecified foot: Secondary | ICD-10-CM | POA: Diagnosis not present

## 2016-10-08 DIAGNOSIS — M549 Dorsalgia, unspecified: Secondary | ICD-10-CM | POA: Diagnosis not present

## 2016-10-08 DIAGNOSIS — E785 Hyperlipidemia, unspecified: Secondary | ICD-10-CM

## 2016-10-08 DIAGNOSIS — G8929 Other chronic pain: Secondary | ICD-10-CM

## 2016-10-08 DIAGNOSIS — I10 Essential (primary) hypertension: Secondary | ICD-10-CM | POA: Diagnosis not present

## 2016-10-08 DIAGNOSIS — K219 Gastro-esophageal reflux disease without esophagitis: Secondary | ICD-10-CM | POA: Diagnosis not present

## 2016-10-08 DIAGNOSIS — R7303 Prediabetes: Secondary | ICD-10-CM | POA: Diagnosis not present

## 2016-10-08 LAB — POCT GLYCOSYLATED HEMOGLOBIN (HGB A1C): HEMOGLOBIN A1C: 6.4

## 2016-10-08 MED ORDER — SIMVASTATIN 10 MG PO TABS: 10.0000 mg | ORAL_TABLET | Freq: Every day | ORAL | 1 refills | Status: DC

## 2016-10-08 MED ORDER — MELOXICAM 7.5 MG PO TABS: 7.5000 mg | ORAL_TABLET | Freq: Every day | ORAL | 0 refills | Status: DC | PRN

## 2016-10-08 MED ORDER — DICLOFENAC SODIUM 1 % TD GEL: 2.0000 g | Freq: Four times a day (QID) | TRANSDERMAL | 11 refills | Status: DC | PRN

## 2016-10-08 MED ORDER — VALSARTAN-HYDROCHLOROTHIAZIDE 160-25 MG PO TABS: 1.0000 | ORAL_TABLET | Freq: Every day | ORAL | 1 refills | Status: DC

## 2016-10-08 MED ORDER — METOPROLOL SUCCINATE ER 200 MG PO TB24: 300.0000 mg | ORAL_TABLET | Freq: Every morning | ORAL | 1 refills | Status: DC

## 2016-10-08 MED ORDER — OMEPRAZOLE 40 MG PO CPDR: 40.0000 mg | DELAYED_RELEASE_CAPSULE | Freq: Every day | ORAL | 1 refills | Status: DC

## 2016-10-08 NOTE — Progress Notes (Signed)
HPI: Zachary Lyons is a 71 y.o. male  who presents to [facility] today, 10/08/16,  for chief complaint of:  Chief Complaint  Patient presents with  . Follow-up  . Back Pain  . Hypertension  . Hyperlipidemia  . Other    pre-diabetes    Back pain . Context: no significant previous injury . Location: thoracic on R, lumbar on R, nonradiating  . Quality: sore . Duration: several years . Timing: on and off  . Modifying factors: belt/brace helps him, no meds tried  HTN: Recently refilled medications. No chest pain, pressure, shortness of breath.  Pre-DM: Has not been following low carbohydrate diet. Due for A1c, results as below. Patient would like to avoid being on medications for now if at all possible  GERD: needs refill. Stable.   HLD: needs refill. Stable.   Patient is accompanied by wife who assists with history-taking.   Past medical history, surgical history, social history and family history reviewed.  Patient Active Problem List   Diagnosis Date Noted  . Thrombocytopenia (HCC) 04/24/2016  . Elevated LFTs 02/17/2015  . Cerumen impaction 04/23/2014  . Prediabetes 04/20/2014  . Essential hypertension, benign 04/08/2014  . Hyperlipidemia 04/08/2014  . Habitual alcohol use 04/08/2014  . OA (osteoarthritis) of knee 04/27/2013    Current medication list and allergy/intolerance information reviewed.   Current Outpatient Prescriptions on File Prior to Visit  Medication Sig Dispense Refill  . metoprolol (TOPROL XL) 200 MG 24 hr tablet Take 1.5 tablets (300 mg total) by mouth every morning. Takes 1 and 1/2 APPOINTMENT NEEDED FOR FURTHER REFILLS 60 tablet 0  . omeprazole (PRILOSEC) 40 MG capsule Take 1 capsule (40 mg total) by mouth daily. APPOINTMENT NEEDED FOR FURTHER REFILLS 30 capsule 0  . simvastatin (ZOCOR) 10 MG tablet Take 1 tablet (10 mg total) by mouth at bedtime. APPOINTMENT NEEDED FOR FURTHER REFILLS 90 tablet 0  .  valsartan-hydrochlorothiazide (DIOVAN HCT) 160-25 MG tablet Take 1 tablet by mouth daily. APPOINTMENT NEEDED FOR FURTHER REFILLS 90 tablet 0  . vardenafil (LEVITRA) 10 MG tablet Take 1 tablet (10 mg total) by mouth daily as needed for erectile dysfunction. 10 tablet 0  . zoster vaccine live, PF, (ZOSTAVAX) [id number] UNT/0.65ML injection Inject 19,400 Units into the skin once. 1 each 0   No current facility-administered medications on file prior to visit.    No Known Allergies    Review of Systems:  Constitutional: No recent illness, feels well today   Cardiac: No  chest pain, No  pressure, No palpitations  Respiratory:  No  shortness of breath.   Gastrointestinal: No  abdominal pain  Musculoskeletal: No new myalgia/arthralgia, chronic back pain   Skin: No  Rash  Neurologic: No  weakness  Exam:  BP 139/81   Pulse 72   Ht 5\' 3"  (1.6 m)   Wt 163 lb (73.9 kg)   BMI 28.87 kg/m    Constitutional: VS see above. General Appearance: alert, well-developed, well-nourished, NAD  Eyes: Normal lids and conjunctive, non-icteric sclera  Ears, Nose, Mouth, Throat: MMM, Normal external inspection ears/nares/mouth/lips/gums.  Neck: No masses, trachea midline.   Respiratory: Normal respiratory effort. no wheeze, no rhonchi, no rales  Cardiovascular: S1/S2 normal, no murmur, no rub/gallop auscultated. RRR.   Musculoskeletal: Gait normal. Symmetric and independent movement of all extremities. Terry shoulder a bit higher on the right side. No scoliosis appreciated. Mild muscle spasm/tenderness paraspinal region upper thoracic and lower lumbar okay to wear brace, advised walking,  consider physical therapy if no improvement with medications.  Neurological: Normal balance/coordination. No tremor.  Skin: warm, dry, intact.   Psychiatric: Normal judgment/insight. Normal mood and affect. Oriented x3.     ASSESSMENT/PLAN:   Essential hypertension, benign - Plan: valsartan-hydrochlorothiazide  (DIOVAN HCT) 160-25 MG tablet, metoprolol (TOPROL XL) 200 MG 24 hr tablet, DISCONTINUED: metoprolol (TOPROL XL) 200 MG 24 hr tablet  Pre-diabetes - [place], hydrate diet emphasized, recheck in 3 months - Plan: POCT HgB A1C  Chronic bilateral back pain, unspecified back location - Consider PT if no improvement with medications. Okay to use back brace. Exercise as tolerated - Plan: meloxicam (MOBIC) 7.5 MG tablet, diclofenac sodium (VOLTAREN) 1 % GEL  Gastroesophageal reflux disease, esophagitis presence not specified - Plan: omeprazole (PRILOSEC) 40 MG capsule  Hyperlipidemia, unspecified hyperlipidemia type - Plan: simvastatin (ZOCOR) 10 MG tablet  Ankle pain, unspecified chronicity, unspecified laterality - Advised patient to follow-up with sports medicine on this issue, insufficient time and this appointment to address non-urgent problem at this moment    Follow-up plan: Return in about 3 months (around 01/08/2017) for recheck pre-diabetes.  Visit summary with medication list and pertinent instructions was printed for patient to review, alert [place] if any changes needed. All questions at time of visit were answered - patient instructed to contact office with any additional concerns. ER/RTC precautions were reviewed with the patient and understanding verbalized.

## 2016-10-08 NOTE — Patient Instructions (Addendum)
Prediabetes Prediabetes is the condition of having a blood sugar (blood glucose) level that is higher than it should be, but not high enough for you to be diagnosed with type 2 diabetes. Having prediabetes puts you at risk for developing type 2 diabetes (type 2 diabetes mellitus). Prediabetes may be called impaired glucose tolerance or impaired fasting glucose. Prediabetes usually does not cause symptoms. Your health care provider can diagnose this condition with blood tests. You may be tested for prediabetes if you are overweight and if you have at least one other risk factor for prediabetes. Risk factors for prediabetes include:  Having a family member with type 2 diabetes.  Being overweight or obese.  Being older than age 45.  Being of American-Indian, African-American, Hispanic/Latino, or Asian/Pacific Islander descent.  Having an inactive (sedentary) lifestyle.  Having a history of gestational diabetes or polycystic ovarian syndrome (PCOS).  Having low levels of good cholesterol (HDL-C) or high levels of blood fats (triglycerides).  Having high blood pressure. What is blood glucose and how is blood glucose measured?   Blood glucose refers to the amount of glucose in your bloodstream. Glucose comes from eating foods that contain sugars and starches (carbohydrates) that the body breaks down into glucose. Your blood glucose level may be measured in mg/dL (milligrams per deciliter) or mmol/L (millimoles per liter).Your blood glucose may be checked with one or more of the following blood tests:  A fasting blood glucose (FBG) test. You will not be allowed to eat (you will fast) for at least 8 hours before a blood sample is taken.  A normal range for FBG is 70-100 mg/dl (3.9-5.6 mmol/L).  An A1c (hemoglobin A1c) blood test. This test provides information about blood glucose control over the previous 2?3months.  An oral glucose tolerance test (OGTT). This test measures your blood glucose  twice:  After fasting. This is your baseline level.  Two hours after you drink a beverage that contains glucose. You may be diagnosed with prediabetes:  If your FBG is 100?125 mg/dL (5.6-6.9 mmol/L).  If your A1c level is 5.7?6.4%.  If your OGGT result is 140?199 mg/dL (7.8-11 mmol/L). These blood tests may be repeated to confirm your diagnosis. What happens if blood glucose is too high? The pancreas produces a hormone (insulin) that helps move glucose from the bloodstream into cells. When cells in the body do not respond properly to insulin that the body makes (insulin resistance), excess glucose builds up in the blood instead of going into cells. As a result, high blood glucose (hyperglycemia) can develop, which can cause many complications. This is a symptom of prediabetes. What can happen if blood glucose stays higher than normal for a long time? Having high blood glucose for a long time is dangerous. Too much glucose in your blood can damage your nerves and blood vessels. Long-term damage can lead to complications from diabetes, which may include:  Heart disease.  Stroke.  Blindness.  Kidney disease.  Depression.  Poor circulation in the feet and legs, which could lead to surgical removal (amputation) in severe cases. How can prediabetes be prevented from turning into type 2 diabetes?   To help prevent type 2 diabetes, take the following actions:  Be physically active.  Do moderate-intensity physical activity for at least 30 minutes on at least 5 days of the week, or as much as told by your health care provider. This could be brisk walking, biking, or water aerobics.  Ask your health care provider what activities are   safe for you. A mix of physical activities may be best, such as walking, swimming, cycling, and strength training.  Lose weight as told by your health care provider.  Losing 5-7% of your body weight can reverse insulin resistance.  Your health care  provider can determine how much weight loss is best for you and can help you lose weight safely.  Follow a healthy meal plan. This includes eating lean proteins, complex carbohydrates, fresh fruits and vegetables, low-fat dairy products, and healthy fats.  Follow instructions from your health care provider about eating or drinking restrictions.  Make an appointment to see a diet and nutrition specialist (registered dietitian) to help you create a healthy eating plan that is right for you.  Do not smoke or use any tobacco products, such as cigarettes, chewing tobacco, and e-cigarettes. If you need help quitting, ask your health care provider.  Take over-the-counter and prescription medicines as told by your health care provider. You may be prescribed medicines that help lower the risk of type 2 diabetes. This information is not intended to replace advice given to you by your health care provider. Make sure you discuss any questions you have with your health care provider. Document Released: 10/24/2015 Document Revised: 12/08/2015 Document Reviewed: 08/23/2015 Elsevier Interactive Patient Education  2017 [employer] Prediabetes Eating Plan Prediabetes-also called impaired glucose tolerance or impaired fasting glucose-is a condition that causes blood sugar (blood glucose) levels to be higher than normal. Following a healthy diet can help to keep prediabetes under control. It can also help to lower the risk of type 2 diabetes and heart disease, which are increased in people who have prediabetes. Along with regular exercise, a healthy diet: Promotes weight loss. Helps to control blood sugar levels. Helps to improve the way that the body uses insulin. What do I need to know about this eating plan? Use the glycemic index (GI) to plan your meals. The index tells you how quickly a food will raise your blood sugar. Choose low-GI foods. These foods take a longer time to raise blood sugar. Pay close  attention to the amount of carbohydrates in the food that you eat. Carbohydrates increase blood sugar levels. Keep track of how many calories you take in. Eating the right amount of calories will help you to achieve a healthy weight. Losing about 7 percent of your starting weight can help to prevent type 2 diabetes. You may want to follow a Mediterranean diet. This diet includes a lot of vegetables, lean meats or fish, whole grains, fruits, and healthy oils and fats. What foods can I eat? Grains  Whole grains, such as whole-wheat or whole-grain breads, crackers, cereals, and pasta. Unsweetened oatmeal. Bulgur. Barley. Quinoa. Brown rice. Corn or whole-wheat flour tortillas or taco shells. Vegetables  Lettuce. Spinach. Peas. Beets. Cauliflower. Cabbage. Broccoli. Carrots. Tomatoes. Squash. Eggplant. Herbs. Peppers. Onions. Cucumbers. Brussels sprouts. Fruits  Berries. Bananas. Apples. Oranges. Grapes. Papaya. Mango. Pomegranate. Kiwi. Grapefruit. Cherries. Meats and Other Protein Sources  Seafood. Lean meats, such as chicken and turkey or lean cuts of pork and beef. Tofu. Eggs. Nuts. Beans. Dairy  Low-fat or fat-free dairy products, such as yogurt, cottage cheese, and cheese. Beverages  Water. Tea. Coffee. Sugar-free or diet soda. Seltzer water. Milk. Milk alternatives, such as soy or almond milk. Condiments  Mustard. Relish. Low-fat, low-sugar ketchup. Low-fat, low-sugar barbecue sauce. Low-fat or fat-free mayonnaise. Sweets and Desserts  Sugar-free or low-fat pudding. Sugar-free or low-fat ice cream and other frozen treats. Fats and Oils    Avocado. Walnuts. Olive oil. The items listed above may not be a complete list of recommended foods or beverages. Contact your dietitian for more options.  What foods are not recommended? Grains  Refined white flour and flour products, such as bread, pasta, snack foods, and cereals. Beverages  Sweetened drinks, such as sweet iced tea and soda. Sweets  and Desserts  Baked goods, such as cake, cupcakes, pastries, cookies, and cheesecake. The items listed above may not be a complete list of foods and beverages to avoid. Contact your dietitian for more information.  This information is not intended to replace advice given to you by your health care provider. Make sure you discuss any questions you have with your health care provider. Document Released: 11/16/2014 Document Revised: 12/08/2015 Document Reviewed: 07/28/2014 Elsevier Interactive Patient Education  2017 [employer] Inc[employer]  

## 2017-02-11 ENCOUNTER — Ambulatory Visit: Payer: Medicare HMO | Admitting: Osteopathic Medicine

## 2017-04-10 ENCOUNTER — Encounter: Payer: Self-pay | Admitting: Osteopathic Medicine

## 2017-04-10 ENCOUNTER — Ambulatory Visit (INDEPENDENT_AMBULATORY_CARE_PROVIDER_SITE_OTHER): Payer: Medicare HMO | Admitting: Osteopathic Medicine

## 2017-04-10 VITALS — BP 140/80 | HR 60 | Wt 168.0 lb

## 2017-04-10 DIAGNOSIS — M549 Dorsalgia, unspecified: Secondary | ICD-10-CM | POA: Diagnosis not present

## 2017-04-10 DIAGNOSIS — Z0000 Encounter for general adult medical examination without abnormal findings: Secondary | ICD-10-CM | POA: Diagnosis not present

## 2017-04-10 DIAGNOSIS — D696 Thrombocytopenia, unspecified: Secondary | ICD-10-CM

## 2017-04-10 DIAGNOSIS — G8929 Other chronic pain: Secondary | ICD-10-CM | POA: Diagnosis not present

## 2017-04-10 DIAGNOSIS — I10 Essential (primary) hypertension: Secondary | ICD-10-CM | POA: Diagnosis not present

## 2017-04-10 DIAGNOSIS — R7303 Prediabetes: Secondary | ICD-10-CM | POA: Diagnosis not present

## 2017-04-10 MED ORDER — DICLOFENAC SODIUM 1 % TD GEL: 2.0000 g | Freq: Four times a day (QID) | TRANSDERMAL | 11 refills | Status: DC | PRN

## 2017-04-10 NOTE — Progress Notes (Signed)
HPI: Zachary Lyons is a 71 y.o. male  who presents to [facility] today, 04/10/17,  for chief complaint of:  Chief Complaint  Patient presents with  . Annual Exam    Patient here for annual physical / wellness exam.  See preventive care reviewed as below.  Recent labs reviewed in detail with the patient.   Additional concerns today include:  HTN: Slightly above goal on current medications but for 71 and no symptoms patient is okay with current numbers and does not want to change medications. Pre-DM: due for labs  Back pain: Request refill on Voltaren gel  Patient is accompanied by wife who assists with history-taking.   Past medical, surgical, social and family history reviewed: Patient Active Problem List   Diagnosis Date Noted  . Thrombocytopenia (HCC) 04/24/2016  . Elevated LFTs 02/17/2015  . Cerumen impaction 04/23/2014  . Prediabetes 04/20/2014  . Essential hypertension, benign 04/08/2014  . Hyperlipidemia 04/08/2014  . Habitual alcohol use 04/08/2014  . OA (osteoarthritis) of knee 04/27/2013   Past Surgical History:  Procedure Laterality Date  . CATARACT EXTRACTION Left 04-22-13  . COLONOSCOPY    . NASAL SINUS SURGERY    . STOMACH SURGERY     oversew of bleeding ulcer  . TOTAL KNEE ARTHROPLASTY Left 04/27/2013   Procedure: LEFT TOTAL KNEE ARTHROPLASTY;  Surgeon: Loanne Drilling, MD;  Location: [facility];  Service: Orthopedics;  Laterality: Left;   Social History  Substance Use Topics  . Smoking status: Former Smoker    Quit date: 04/23/1979  . Smokeless tobacco: Never Used  . Alcohol use 3.6 oz/week    6 Cans of beer per week   No family history on file.   Current medication list and allergy/intolerance information reviewed:   Current Outpatient Prescriptions  Medication Sig Dispense Refill  . diclofenac sodium (VOLTAREN) 1 % GEL Apply 2-4 g topically 4 (four) times daily as needed (pain). To affected joint/back. 100 g 11  .  meloxicam (MOBIC) 7.5 MG tablet Take 1 tablet (7.5 mg total) by mouth daily as needed for pain. 30 tablet 0  . metoprolol (TOPROL XL) 200 MG 24 hr tablet Take 1.5 tablets (300 mg total) by mouth every morning. Takes 1 and 1/2 135 tablet 1  . omeprazole (PRILOSEC) 40 MG capsule Take 1 capsule (40 mg total) by mouth daily. 90 capsule 1  . simvastatin (ZOCOR) 10 MG tablet Take 1 tablet (10 mg total) by mouth at bedtime. 90 tablet 1  . valsartan-hydrochlorothiazide (DIOVAN HCT) 160-25 MG tablet Take 1 tablet by mouth daily. 90 tablet 1  . vardenafil (LEVITRA) 10 MG tablet Take 1 tablet (10 mg total) by mouth daily as needed for erectile dysfunction. 10 tablet 0  . zoster vaccine live, PF, (ZOSTAVAX) 16109 UNT/0.65ML injection Inject 19,400 Units into the skin once. 1 each 0   No current facility-administered medications for this visit.    No Known Allergies    Review of Systems:  Constitutional:  No  fever, no chills, No recent illness, No unintentional weight changes. No significant fatigue.   HEENT: No  headache, no vision change, no hearing change, No sore throat, No  sinus pressure  Cardiac: No  chest pain, No  pressure, No palpitations,  Respiratory:  No  shortness of breath. No  Cough  Gastrointestinal: No  abdominal pain, No  nausea, No  vomiting,  No  blood in stool, No  diarrhea, No  constipation   Musculoskeletal: No new  myalgia/arthralgia  Skin: No  Ras  Hem/Onc: No  easy bruising/bleeding   Neurologic: No  weakness, No  dizzines  Psychiatric: No  concerns with depression, No  concerns with anxiety  Exam:  BP 140/80   Pulse 60   Wt 168 lb (76.2 kg)   BMI 29.76 kg/m   Constitutional: VS see above. General Appearance: alert, well-developed, well-nourished, NAD  Eyes: Normal lids and conjunctive, non-icteric sclera  Ears, Nose, Mouth, Throat: MMM, Normal external inspection ears/nares/mouth/lips/gums. TM normal bilaterally. Pharynx/tonsils no erythema, no exudate.  Nasal mucosa normal.   Neck: No masses, trachea midline. No thyroid enlargement. No tenderness/mass appreciated. No lymphadenopathy  Respiratory: Normal respiratory effort. no wheeze, no rhonchi, no rales  Cardiovascular: S1/S2 normal, no murmur, no rub/gallop auscultated. RRR. No lower extremity edema.  Gastrointestinal: Nontender, no masses. No hepatomegaly, no splenomegaly. No hernia appreciated. Bowel sounds normal. Rectal exam deferred.   Musculoskeletal: Gait normal. No clubbing/cyanosis of digits.   Neurological: Normal balance/coordination. No tremor. No cranial nerve deficit on limited exam. Motor and sensation intact and symmetric. Cerebellar reflexes intact.   Skin: warm, dry, intact. No rash/ulcer. No concerning nevi or subq nodules on limited exam.    Psychiatric: Normal judgment/insight. Normal mood and affect. Oriented x3.     ASSESSMENT/PLAN:   Annual physical exam - Plan: CBC, COMPLETE METABOLIC PANEL WITH GFR, Lipid panel, TSH, Hemoglobin A1c, PSA, Total with Reflex to PSA, Free, Hepatitis C antibody  Essential hypertension, benign - Plan: CBC, COMPLETE METABOLIC PANEL WITH GFR, Lipid panel, TSH  Pre-diabetes - Plan: Hemoglobin A1c  Thrombocytopenia (HCC) - Plan: CBC  Chronic bilateral back pain, unspecified back location - Consider PT if no improvement with medications. Okay to use back brace. Exercise as tolerated - Plan: diclofenac sodium (VOLTAREN) 1 % GEL   MALE PREVENTIVE CARE  updated 04/10/17  ANNUAL SCREENING/COUNSELING  Any changes to health in the past year? no  Diet/Exercise - HEALTHY HABITS DISCUSSED TO DECREASE CV RISK History  Smoking Status  . Former Smoker  . Quit date: 04/23/1979  Smokeless Tobacco  . Never Used   History  Alcohol Use  . 3.6 oz/week  . 6 Cans of beer per week   No flowsheet data found.  SEXUAL/REPRODUCTIVE HEALTH  Sexually active in the past year? - Yes with male.  STI testing needed/desired today? -  no  Any concerns with testosterone/libido? - no  INFECTIOUS DISEASE SCREENING  HIV - does not need  GC/CT - does not need  HepC - needs  TB - does not need  CANCER SCREENING  Lung - USPSTF: 55-80yo w/ 30 py hx unless quit w/in 75yr - does not need  Colon - need records   Prostate - needs  OTHER DISEASE SCREENING  Lipid - needs  DM2 - needs  AAA - 65-75yo ever smoked: needs - not sure he wants this, will think about it  Osteoporosis - men 70yo+ - needs - not sure he wants this, will think about it  ADULT VACCINATION  Influenza - annual vaccine recommended - declined  Td - booster every 10 years - declined  Zoster - option at 12, yes at 41+ - declined  PCV13 - was offered and declined by the patient  PPSV23 - was offered and declined by the patient  There is no immunization history on file for this patient.    Visit summary with medication list and pertinent instructions was printed for patient to review. All questions at time of visit were answered -  patient instructed to contact office with any additional concerns. ER/RTC precautions were reviewed with the patient. Follow-up plan: Return in about 3 months (around 07/10/2017) for follow-up on blood pressure.

## 2017-04-10 NOTE — Patient Instructions (Addendum)
Preventive Care 65 Years and Older, Male Preventive care refers to lifestyle choices and visits with your health care provider that can promote health and wellness. What does preventive care include?  A yearly physical exam. This is also called an annual well check.  Dental exams once or twice a year.  Routine eye exams. Ask your health care provider how often you should have your eyes checked.  Personal lifestyle choices, including: ? Daily care of your teeth and gums. ? Regular physical activity. ? Eating a healthy diet. ? Avoiding tobacco and drug use. ? Limiting alcohol use. ? Practicing safe sex. ? Taking low doses of aspirin every day. ? Taking vitamin and mineral supplements as recommended by your health care provider. What happens during an annual well check? The services and screenings done by your health care provider during your annual well check will depend on your age, overall health, lifestyle risk factors, and family history of disease. Counseling Your health care provider may ask you questions about your:  Alcohol use.  Tobacco use.  Drug use.  Emotional well-being.  Home and relationship well-being.  Sexual activity.  Eating habits.  History of falls.  Memory and ability to understand (cognition).  Work and work environment.  Screening You may have the following tests or measurements:  Height, weight, and BMI.  Blood pressure.  Lipid and cholesterol levels. These may be checked every 5 years, or more frequently if you are over 50 years old.  Skin check.  Lung cancer screening. You may have this screening every year starting at age 55 if you have a 30-pack-year history of smoking and currently smoke or have quit within the past 15 years.  Fecal occult blood test (FOBT) of the stool. You may have this test every year starting at age 50.  Flexible sigmoidoscopy or colonoscopy. You may have a sigmoidoscopy every 5 years or a colonoscopy every 10  years starting at age 50.  Prostate cancer screening. Recommendations will vary depending on your family history and other risks.  Hepatitis C blood test.  Hepatitis B blood test.  Sexually transmitted disease (STD) testing.  Diabetes screening. This is done by checking your blood sugar (glucose) after you have not eaten for a while (fasting). You may have this done every 1-3 years.  Abdominal aortic aneurysm (AAA) screening. You may need this if you are a current or former smoker.  Osteoporosis. You may be screened starting at age 70 if you are at high risk.  Talk with your health care provider about your test results, treatment options, and if necessary, the need for more tests. Vaccines Your health care provider may recommend certain vaccines, such as:  Influenza vaccine. This is recommended every year.  Tetanus, diphtheria, and acellular pertussis (Tdap, Td) vaccine. You may need a Td booster every 10 years.  Varicella vaccine. You may need this if you have not been vaccinated.  Zoster vaccine. You may need this after age 60.  Measles, mumps, and rubella (MMR) vaccine. You may need at least one dose of MMR if you were born in 1957 or later. You may also need a second dose.  Pneumococcal 13-valent conjugate (PCV13) vaccine. One dose is recommended after age 71.  Pneumococcal polysaccharide (PPSV23) vaccine. One dose is recommended after age 71.  Meningococcal vaccine. You may need this if you have certain conditions.  Hepatitis A vaccine. You may need this if you have certain conditions or if you travel or work in places where you   may be exposed to hepatitis A.  Hepatitis B vaccine. You may need this if you have certain conditions or if you travel or work in places where you may be exposed to hepatitis B.  Haemophilus influenzae type b (Hib) vaccine. You may need this if you have certain risk factors.  Talk to your health care provider about which screenings and vaccines  you need and how often you need them. This information is not intended to replace advice given to you by your health care provider. Make sure you discuss any questions you have with your health care provider. Document Released: 07/29/2015 Document Revised: 03/21/2016 Document Reviewed: 05/03/2015 Elsevier Interactive Patient Education  2017 [place] to return for nurse visit to verify home blood pressure cuff. In the meantime, be keeping a record of your blood pressures at home and bring this with you to the visit with the nurse.   If your cuff is measuring within 5-10 points of ours AND your home numbers are less than 140/90 (ideally less than 130/80) then nothing else to do.   If your home blood pressure cuff is inaccurate or is accurate but measuring above goal, we will need to talk about adjusting your medications.

## 2017-04-15 DIAGNOSIS — R7303 Prediabetes: Secondary | ICD-10-CM | POA: Diagnosis not present

## 2017-04-15 DIAGNOSIS — D696 Thrombocytopenia, unspecified: Secondary | ICD-10-CM | POA: Diagnosis not present

## 2017-04-15 DIAGNOSIS — Z125 Encounter for screening for malignant neoplasm of prostate: Secondary | ICD-10-CM | POA: Diagnosis not present

## 2017-04-15 DIAGNOSIS — I10 Essential (primary) hypertension: Secondary | ICD-10-CM | POA: Diagnosis not present

## 2017-04-15 DIAGNOSIS — E785 Hyperlipidemia, unspecified: Secondary | ICD-10-CM | POA: Diagnosis not present

## 2017-04-15 DIAGNOSIS — Z0000 Encounter for general adult medical examination without abnormal findings: Secondary | ICD-10-CM | POA: Diagnosis not present

## 2017-04-16 ENCOUNTER — Other Ambulatory Visit: Payer: Self-pay | Admitting: Osteopathic Medicine

## 2017-04-16 ENCOUNTER — Telehealth: Payer: Self-pay

## 2017-04-16 DIAGNOSIS — I10 Essential (primary) hypertension: Secondary | ICD-10-CM

## 2017-04-16 NOTE — Telephone Encounter (Signed)
[name] called and states nasal spray was not sent to [facility]. I did not see it in his medication list or in the office note. Please advise.

## 2017-04-17 MED ORDER — FLUTICASONE PROPIONATE 50 MCG/ACT NA SUSP: 1.0000 | Freq: Every day | NASAL | 3 refills | Status: DC

## 2017-04-17 NOTE — Telephone Encounter (Signed)
Sent!

## 2017-04-18 LAB — CBC
HCT: 47 % (ref 38.5–50.0)
Hemoglobin: 16.2 g/dL (ref 13.2–17.1)
MCH: 32.5 pg (ref 27.0–33.0)
MCHC: 34.5 g/dL (ref 32.0–36.0)
MCV: 94.4 fL (ref 80.0–100.0)
MPV: 9.4 fL (ref 7.5–12.5)
PLATELETS: 139 10*3/uL — AB (ref 140–400)
RBC: 4.98 10*6/uL (ref 4.20–5.80)
RDW: 13.2 % (ref 11.0–15.0)
WBC: 6.3 10*3/uL (ref 3.8–10.8)

## 2017-04-18 LAB — LIPID PANEL
CHOL/HDL RATIO: 3 (calc) (ref <5.0)
CHOLESTEROL: 195 mg/dL (ref <200)
HDL: 64 mg/dL (ref >40)
LDL Cholesterol (Calc): 104 mg/dL (calc) — ABNORMAL HIGH
Non-HDL Cholesterol (Calc): 131 mg/dL (calc) — ABNORMAL HIGH (ref <130)
Triglycerides: 153 mg/dL — ABNORMAL HIGH (ref <150)

## 2017-04-18 LAB — COMPLETE METABOLIC PANEL WITH GFR
AG Ratio: 1.3 (calc) (ref 1.0–2.5)
ALKALINE PHOSPHATASE (APISO): 105 U/L (ref 40–115)
ALT: 13 U/L (ref 9–46)
AST: 21 U/L (ref 10–35)
Albumin: 4.2 g/dL (ref 3.6–5.1)
BILIRUBIN TOTAL: 0.9 mg/dL (ref 0.2–1.2)
BUN: 10 mg/dL (ref 7–25)
CHLORIDE: 100 mmol/L (ref 98–110)
CO2: 32 mmol/L (ref 20–32)
Calcium: 9.3 mg/dL (ref 8.6–10.3)
Creat: 1.07 mg/dL (ref 0.70–1.18)
GFR, Est African American: 81 mL/min/{1.73_m2} (ref >=60)
GFR, Est Non African American: 69 mL/min/{1.73_m2} (ref >=60)
GLOBULIN: 3.2 g/dL (ref 1.9–3.7)
GLUCOSE: 130 mg/dL — AB (ref 65–99)
Potassium: 3.6 mmol/L (ref 3.5–5.3)
SODIUM: 139 mmol/L (ref 135–146)
TOTAL PROTEIN: 7.4 g/dL (ref 6.1–8.1)

## 2017-04-18 LAB — REFLEX PSA, FREE
PSA, % Free: 18 % (calc) — ABNORMAL LOW (ref >25)
PSA, FREE: 0.7 ng/mL

## 2017-04-18 LAB — HEPATITIS C ANTIBODY
HEP C AB: NONREACTIVE
SIGNAL TO CUT-OFF: 0.02 (ref <1.00)

## 2017-04-18 LAB — PSA, TOTAL WITH REFLEX TO PSA, FREE: PSA, Total: 4 ng/mL (ref <=4.0)

## 2017-04-18 LAB — TSH: TSH: 1.23 m[IU]/L (ref 0.40–4.50)

## 2017-04-18 LAB — HEMOGLOBIN A1C
EAG (MMOL/L): 7.3 (calc)
Hgb A1c MFr Bld: 6.2 % of total Hgb — ABNORMAL HIGH (ref <5.7)
Mean Plasma Glucose: 131 (calc)

## 2017-04-23 ENCOUNTER — Other Ambulatory Visit: Payer: Self-pay | Admitting: Osteopathic Medicine

## 2017-04-23 DIAGNOSIS — K219 Gastro-esophageal reflux disease without esophagitis: Secondary | ICD-10-CM

## 2017-04-29 ENCOUNTER — Telehealth: Payer: Self-pay

## 2017-04-29 NOTE — Telephone Encounter (Signed)
Pt's wife called and said that the nose spray you ordered is not working.  She is requesting something different.  Please advise.

## 2017-05-01 MED ORDER — AZELASTINE-FLUTICASONE 137-50 MCG/ACT NA SUSP: NASAL | 11 refills | Status: DC

## 2017-05-01 NOTE — Telephone Encounter (Signed)
Alternative sent please let patient know

## 2017-05-01 NOTE — Telephone Encounter (Signed)
Patient wife has been informed. Rhonda Cunningham,CMA  

## 2017-05-29 ENCOUNTER — Ambulatory Visit: Payer: Medicare HMO | Admitting: Osteopathic Medicine

## 2017-05-29 ENCOUNTER — Encounter: Payer: Self-pay | Admitting: Osteopathic Medicine

## 2017-05-29 VITALS — BP 150/77 | HR 84 | Temp 97.8°F | Wt 165.0 lb

## 2017-05-29 DIAGNOSIS — J343 Hypertrophy of nasal turbinates: Secondary | ICD-10-CM

## 2017-05-29 MED ORDER — AZELASTINE-FLUTICASONE 137-50 MCG/ACT NA SUSP: NASAL | 11 refills | Status: DC

## 2017-05-29 MED ORDER — AMOXICILLIN-POT CLAVULANATE 875-125 MG PO TABS: 1.0000 | ORAL_TABLET | Freq: Two times a day (BID) | ORAL | 0 refills | Status: DC

## 2017-05-29 MED ORDER — OXYMETAZOLINE HCL 0.05 % NA SOLN: 2.0000 | Freq: Two times a day (BID) | NASAL | 0 refills | Status: DC

## 2017-05-29 NOTE — Patient Instructions (Signed)
Plan:  Oxymetazoline nasal spray for 3 days maximum, to help swelling  After that, Azelastine-Fluticasone nasal spray  Start antibiotics today  Will try to get a CT of the sinuses scheduled  Will refer to ENT specialist - you should get a call in the next few days

## 2017-05-29 NOTE — Progress Notes (Signed)
HPI: Zachary Lyons is a 71 y.o. male who  has a past medical history of Arthritis, GERD (gastroesophageal reflux disease), Hypertension, Peptic ulcer, and Transfusion history (04-22-13).  he presents to [facility]  today, 05/29/17,  for chief complaint of:  Chief Complaint  Patient presents with  . Nasal Congestion    Sinus congestion: ongoing months. Never picked up nasal spray, there was some confusion about the pharmacy it was sent to. He reports persistent congestion and sinus pressure, bilateral equally. No fever/chills.   Patient is accompanied by wife who assists with history-taking.    Past medical, surgical, social and family history reviewed:  Patient Active Problem List   Diagnosis Date Noted  . Thrombocytopenia (HCC) 04/24/2016  . Elevated LFTs 02/17/2015  . Cerumen impaction 04/23/2014  . Prediabetes 04/20/2014  . Essential hypertension, benign 04/08/2014  . Hyperlipidemia 04/08/2014  . Habitual alcohol use 04/08/2014  . OA (osteoarthritis) of knee 04/27/2013    Past Surgical History:  Procedure Laterality Date  . CATARACT EXTRACTION Left 04-22-13  . COLONOSCOPY    . NASAL SINUS SURGERY    . STOMACH SURGERY     oversew of bleeding ulcer    Social History   Tobacco Use  . Smoking status: Former Smoker    Last attempt to quit: 04/23/1979    Years since quitting: 38.1  . Smokeless tobacco: Never Used  Substance Use Topics  . Alcohol use: Yes    Alcohol/week: 3.6 oz    Types: 6 Cans of beer per week    No family history on file.   Current medication list and allergy/intolerance information reviewed:    Current Outpatient Medications  Medication Sig Dispense Refill  . Azelastine-Fluticasone 137-50 MCG/ACT SUSP One spray each nostril BID 1 Bottle 11  . diclofenac sodium (VOLTAREN) 1 % GEL Apply 2-4 g topically 4 (four) times daily as needed (pain). To affected joint/back. 100 g 11  . meloxicam (MOBIC) 7.5 MG tablet Take  1 tablet (7.5 mg total) by mouth daily as needed for pain. 30 tablet 0  . metoprolol (TOPROL-XL) 200 MG 24 hr tablet TAKE 1 AND 1/2 TABLETS EVERY MORNING 135 tablet 1  . omeprazole (PRILOSEC) 40 MG capsule TAKE 1 CAPSULE EVERY DAY 90 capsule 1  . simvastatin (ZOCOR) 10 MG tablet Take 1 tablet (10 mg total) by mouth at bedtime. 90 tablet 1  . valsartan-hydrochlorothiazide (DIOVAN HCT) 160-25 MG tablet Take 1 tablet by mouth daily. 90 tablet 1  . vardenafil (LEVITRA) 10 MG tablet Take 1 tablet (10 mg total) by mouth daily as needed for erectile dysfunction. 10 tablet 0   No current facility-administered medications for this visit.     No Known Allergies    Review of Systems:  Constitutional:  No  fever, no chills, No recent illness, No unintentional weight changes.  HEENT: No  headache, no vision change, +sinus pressure as per HPI  Cardiac: No  chest pain, No  pressure, No palpitations  Respiratory:  No  shortness of breath. +Cough  Gastrointestinal: No  abdominal pain, No  nausea, No  vomiting,  No  blood in stool, No  diarrhea, No  constipation   Musculoskeletal: No new myalgia/arthralgia  Skin: No  Rash   Exam:  BP (!) 150/77   Pulse 84   Temp 97.8 F (36.6 C) (Oral)   Wt 165 lb (74.8 kg)   BMI 29.23 kg/m   Constitutional: VS see above. General Appearance: alert, well-developed, well-nourished, NAD  Eyes: Normal lids and conjunctive, non-icteric sclera  Ears, Nose, Mouth, Throat: MMM, Normal external inspection ears/nares/mouth/lips/gums. TM normal bilaterally. Pharynx/tonsils no erythema, no exudate. Nasal mucosa normal color/texture but (+)mucus and turbinates are edematous, touching the septum, no posterior nasal passages visible either side    Neck: No masses, trachea midline. No thyroid enlargement. No tenderness/mass appreciated. No lymphadenopathy  Respiratory: Normal respiratory effort. no wheeze, no rhonchi, no rales  Cardiovascular: S1/S2 normal, no murmur,  no rub/gallop auscultated. RRR. No lower extremity edema.  Musculoskeletal: Gait normal.   Neurological: Normal balance/coordination. No tremor.   Skin: warm, dry, intact.   Psychiatric: Normal judgment/insight. Normal mood and affect. Oriented x3.     ASSESSMENT/PLAN:   Hypertrophy of nasal turbinates - Plan: Ambulatory referral to ENT, CT Maxillofacial WO CM   Meds ordered this encounter  Medications  . amoxicillin-clavulanate (AUGMENTIN) 875-125 MG tablet    Sig: Take 1 tablet 2 (two) times daily by mouth.    Dispense:  14 tablet    Refill:  0  . oxymetazoline (AFRIN NASAL SPRAY) 0.05 % nasal spray    Sig: Place 2 sprays 2 (two) times daily into both nostrils. For maximum 3 days, as needed for severe sinus congestion    Dispense:  20 mL    Refill:  0  . Azelastine-Fluticasone 137-50 MCG/ACT SUSP    Sig: One spray each nostril BID    Dispense:  1 Bottle    Refill:  11   Orders Placed This Encounter  Procedures  . CT Maxillofacial WO CM    Order Specific Question:   Preferred imaging location?    Answer:   [facility]    Order Specific Question:   Radiology Contrast Protocol - do NOT remove file path    Answer:   [email or web address]  . Ambulatory referral to ENT    Referral Priority:   Routine    Referral Type:   Consultation    Referral Reason:   Specialty Services Required    Requested Specialty:   Otolaryngology    Number of Visits Requested:   1       Patient Instructions  Plan:  Oxymetazoline nasal spray for 3 days maximum, to help swelling  After that, Azelastine-Fluticasone nasal spray  Start antibiotics today  Will try to get a CT of the sinuses scheduled  Will refer to ENT specialist - you should get a call in the next few days     Visit summary with medication list and pertinent instructions was printed for patient to review. All questions at time of visit were answered - patient instructed to contact office  with any additional concerns. ER/RTC precautions were reviewed with the patient. Follow-up plan: Return if symptoms worsen or fail to improve.  Note: Total time spent 25 minutes, greater than 50% of the visit was spent face-to-face counseling and coordinating care for the following: The encounter diagnosis was Hypertrophy of nasal turbinates..  Please note: voice recognition software was used to produce this document, and typos may escape review. Please contact Dr. Lyn HollingsheadAlexander for any needed clarifications.

## 2017-05-31 ENCOUNTER — Ambulatory Visit (INDEPENDENT_AMBULATORY_CARE_PROVIDER_SITE_OTHER): Payer: Medicare HMO

## 2017-05-31 DIAGNOSIS — J343 Hypertrophy of nasal turbinates: Secondary | ICD-10-CM

## 2017-05-31 DIAGNOSIS — R065 Mouth breathing: Secondary | ICD-10-CM | POA: Diagnosis not present

## 2017-06-11 ENCOUNTER — Telehealth: Payer: Self-pay

## 2017-06-11 DIAGNOSIS — M549 Dorsalgia, unspecified: Principal | ICD-10-CM

## 2017-06-11 DIAGNOSIS — G8929 Other chronic pain: Secondary | ICD-10-CM

## 2017-06-11 MED ORDER — MELOXICAM 7.5 MG PO TABS: 7.5000 mg | ORAL_TABLET | Freq: Every day | ORAL | 0 refills | Status: DC | PRN

## 2017-06-11 NOTE — Telephone Encounter (Signed)
Rx sent 

## 2017-06-11 NOTE — Telephone Encounter (Signed)
[name] called and states he needs a refill on Meloxicam. Not on current medication list. She needs it to go to [facility].

## 2017-06-28 DIAGNOSIS — J339 Nasal polyp, unspecified: Secondary | ICD-10-CM | POA: Diagnosis not present

## 2017-07-02 ENCOUNTER — Encounter: Payer: Self-pay | Admitting: Osteopathic Medicine

## 2017-07-02 DIAGNOSIS — J339 Nasal polyp, unspecified: Secondary | ICD-10-CM | POA: Insufficient documentation

## 2017-07-03 ENCOUNTER — Other Ambulatory Visit: Payer: Self-pay | Admitting: Osteopathic Medicine

## 2017-07-03 DIAGNOSIS — I10 Essential (primary) hypertension: Secondary | ICD-10-CM

## 2017-07-05 ENCOUNTER — Other Ambulatory Visit: Payer: Self-pay | Admitting: Osteopathic Medicine

## 2017-07-05 DIAGNOSIS — E785 Hyperlipidemia, unspecified: Secondary | ICD-10-CM

## 2017-07-25 ENCOUNTER — Encounter: Payer: Self-pay | Admitting: Osteopathic Medicine

## 2017-07-25 ENCOUNTER — Ambulatory Visit (INDEPENDENT_AMBULATORY_CARE_PROVIDER_SITE_OTHER): Payer: Medicare HMO | Admitting: Osteopathic Medicine

## 2017-07-25 ENCOUNTER — Ambulatory Visit (INDEPENDENT_AMBULATORY_CARE_PROVIDER_SITE_OTHER): Payer: Medicare HMO

## 2017-07-25 VITALS — BP 133/70 | HR 56 | Temp 97.4°F | Wt 163.1 lb

## 2017-07-25 DIAGNOSIS — J324 Chronic pansinusitis: Secondary | ICD-10-CM

## 2017-07-25 DIAGNOSIS — R05 Cough: Secondary | ICD-10-CM | POA: Diagnosis not present

## 2017-07-25 DIAGNOSIS — R059 Cough, unspecified: Secondary | ICD-10-CM

## 2017-07-25 MED ORDER — LEVOFLOXACIN 500 MG PO TABS: 500.0000 mg | ORAL_TABLET | Freq: Every day | ORAL | 0 refills | Status: DC

## 2017-07-25 MED ORDER — BENZONATATE 200 MG PO CAPS: 200.0000 mg | ORAL_CAPSULE | Freq: Three times a day (TID) | ORAL | 0 refills | Status: DC | PRN

## 2017-07-25 NOTE — Progress Notes (Signed)
HPI: Zachary Lyons is a 72 y.o. male who  has a past medical history of Arthritis, GERD (gastroesophageal reflux disease), Hypertension, Peptic ulcer, and Transfusion history (04-22-13).  he presents to [facility] today, 07/25/17,  for chief complaint of:  Chief Complaint  Patient presents with  . URI    Bad cold ongoing for 2 weeks, productive cough, worse at night. Taking amoxicillin d/t recent dental procedure. Sinus congestion is his greatest complaint, this is a chronic issue but is worsening with acute illness. Reports some sore throat, chills.  Recently seen by ENT, 06/28/2017. Treating nasal polyp with prednisone dosepak, were planning to follow-up in 2-3 weeks to assess response to treatment. He has a follow-up appointment tomorrow.  Patient is accompanied by wife who assists with history-taking.    Past medical, surgical, social and family history reviewed:  Patient Active Problem List   Diagnosis Date Noted  . Nasal polyp 07/02/2017  . Thrombocytopenia (HCC) 04/24/2016  . Elevated LFTs 02/17/2015  . Cerumen impaction 04/23/2014  . Prediabetes 04/20/2014  . Essential hypertension, benign 04/08/2014  . Hyperlipidemia 04/08/2014  . Habitual alcohol use 04/08/2014  . OA (osteoarthritis) of knee 04/27/2013    Past Surgical History:  Procedure Laterality Date  . CATARACT EXTRACTION Left 04-22-13  . COLONOSCOPY    . NASAL SINUS SURGERY    . STOMACH SURGERY     oversew of bleeding ulcer  . TOTAL KNEE ARTHROPLASTY Left 04/27/2013   Procedure: LEFT TOTAL KNEE ARTHROPLASTY;  Surgeon: Loanne DrillingFrank V Aluisio, MD;  Location: [facility];  Service: Orthopedics;  Laterality: Left;    Social History   Tobacco Use  . Smoking status: Former Smoker    Last attempt to quit: 04/23/1979    Years since quitting: 38.2  . Smokeless tobacco: Never Used  Substance Use Topics  . Alcohol use: Yes    Alcohol/week: 3.6 oz    Types: 6 Cans of beer per week     No family history on file.   Current medication list and allergy/intolerance information reviewed:    Current Outpatient Medications  Medication Sig Dispense Refill  . amoxicillin-clavulanate (AUGMENTIN) 875-125 MG tablet Take 1 tablet 2 (two) times daily by mouth. 14 tablet 0  . Azelastine-Fluticasone 137-50 MCG/ACT SUSP One spray each nostril BID 1 Bottle 11  . diclofenac sodium (VOLTAREN) 1 % GEL Apply 2-4 g topically 4 (four) times daily as needed (pain). To affected joint/back. 100 g 11  . meloxicam (MOBIC) 7.5 MG tablet Take 1 tablet (7.5 mg total) by mouth daily as needed for pain. 90 tablet 0  . metoprolol (TOPROL-XL) 200 MG 24 hr tablet TAKE 1 AND 1/2 TABLETS EVERY MORNING 135 tablet 1  . omeprazole (PRILOSEC) 40 MG capsule TAKE 1 CAPSULE EVERY DAY 90 capsule 1  . oxymetazoline (AFRIN NASAL SPRAY) 0.05 % nasal spray Place 2 sprays 2 (two) times daily into both nostrils. For maximum 3 days, as needed for severe sinus congestion 20 mL 0  . simvastatin (ZOCOR) 10 MG tablet TAKE 1 TABLET AT BEDTIME 90 tablet 1  . valsartan-hydrochlorothiazide (DIOVAN-HCT) 160-25 MG tablet Take 1 tablet by mouth daily. Due for follow up in January 2019. 90 tablet 0   No current facility-administered medications for this visit.     No Known Allergies    Review of Systems:  Constitutional:  No  fever, +chills, +recent illness, No unintentional weight changes. +significant fatigue.   HEENT: +sinus headache, no vision change, no hearing change,  No sore throat, +sinus pressure  Cardiac: No  chest pain, No  pressure, No palpitations, No  Orthopnea  Respiratory:  No  shortness of breath. +Cough  Gastrointestinal: No  abdominal pain, No  nausea, No  vomiting,  No  blood in stool, No  diarrhea  Musculoskeletal: No new myalgia/arthralgia  Skin: No  Rash  Neurologic: No  weakness, No  dizziness   Exam:  BP 133/70   Pulse (!) 56   Temp (!) 97.4 F (36.3 C) (Oral)   Wt 163 lb 1.3 oz (74 kg)    BMI 28.89 kg/m   Constitutional: VS see above. General Appearance: alert, well-developed, well-nourished, NAD  Eyes: Normal lids and conjunctive, non-icteric sclera  Ears, Nose, Mouth, Throat: MMM, Normal external inspection ears/nares/mouth/lips/gums. TM normal bilaterally. Pharynx/tonsils no erythema, no exudate. Nasal turbinates enlarged.   Neck: No masses, trachea midline. No thyroid enlargement. No tenderness/mass appreciated. No lymphadenopathy  Respiratory: Normal respiratory effort. +R side wheeze, no rhonchi, no rales  Cardiovascular: S1/S2 normal, no murmur, no rub/gallop auscultated. RRR.   Musculoskeletal: Gait normal.  Neurological: Normal balance/coordination. No tremor.   Psychiatric: Normal judgment/insight. Normal mood and affect. Oriented x3.    Dg Chest 2 View  Result Date: 07/25/2017 CLINICAL DATA:  Cough and congestion 2 weeks. EXAM: CHEST  2 VIEW COMPARISON:  04/22/2013 FINDINGS: Lungs are adequately inflated and otherwise clear. Cardiomediastinal silhouette and remainder of the exam is unchanged. IMPRESSION: No active cardiopulmonary disease. Electronically Signed   By: Elberta Fortis M.D.   On: 07/25/2017 21:07   Chest x-ray personally reviewed.  ASSESSMENT/PLAN:   Cough - Plan: DG Chest 2 View  Chronic pansinusitis -   Levaquin should cover for sinus infection, lungs sound clear and x-rays okay. Would stop the amoxicillin   Meds ordered this encounter  Medications  . levofloxacin (LEVAQUIN) 500 MG tablet    Sig: Take 1 tablet (500 mg total) by mouth daily.    Dispense:  7 tablet    Refill:  0  . benzonatate (TESSALON) 200 MG capsule    Sig: Take 1 capsule (200 mg total) by mouth 3 (three) times daily as needed for cough.    Dispense:  30 capsule    Refill:  0    There are no Patient Instructions on file for this visit.   Visit summary with medication list and pertinent instructions was printed for patient to review. All questions at time  of visit were answered - patient instructed to contact office with any additional concerns. ER/RTC precautions were reviewed with the patient.   Follow-up plan: Return if symptoms worsen or fail to improve.  Note: Total time spent 25 minutes, greater than 50% of the visit was spent face-to-face counseling and coordinating care for the following: The primary encounter diagnosis was Cough. A diagnosis of Chronic pansinusitis was also pertinent to this visit.Marland Kitchen  Please note: voice recognition software was used to produce this document, and typos may escape review. Please contact Dr. Lyn Hollingshead for any needed clarifications.

## 2017-07-26 ENCOUNTER — Encounter: Payer: Self-pay | Admitting: Osteopathic Medicine

## 2017-07-26 DIAGNOSIS — J329 Chronic sinusitis, unspecified: Secondary | ICD-10-CM | POA: Diagnosis not present

## 2017-07-26 DIAGNOSIS — J339 Nasal polyp, unspecified: Secondary | ICD-10-CM | POA: Diagnosis not present

## 2017-07-26 DIAGNOSIS — J309 Allergic rhinitis, unspecified: Secondary | ICD-10-CM | POA: Diagnosis not present

## 2017-08-14 ENCOUNTER — Other Ambulatory Visit: Payer: Self-pay | Admitting: Osteopathic Medicine

## 2017-08-14 DIAGNOSIS — J339 Nasal polyp, unspecified: Secondary | ICD-10-CM | POA: Diagnosis not present

## 2017-08-14 DIAGNOSIS — M549 Dorsalgia, unspecified: Principal | ICD-10-CM

## 2017-08-14 DIAGNOSIS — G8929 Other chronic pain: Secondary | ICD-10-CM

## 2017-08-14 DIAGNOSIS — J324 Chronic pansinusitis: Secondary | ICD-10-CM | POA: Diagnosis not present

## 2017-08-16 DIAGNOSIS — J339 Nasal polyp, unspecified: Secondary | ICD-10-CM | POA: Diagnosis not present

## 2017-08-16 DIAGNOSIS — J329 Chronic sinusitis, unspecified: Secondary | ICD-10-CM | POA: Diagnosis not present

## 2017-08-16 DIAGNOSIS — J320 Chronic maxillary sinusitis: Secondary | ICD-10-CM | POA: Diagnosis not present

## 2017-08-16 DIAGNOSIS — J323 Chronic sphenoidal sinusitis: Secondary | ICD-10-CM | POA: Diagnosis not present

## 2017-08-16 DIAGNOSIS — J321 Chronic frontal sinusitis: Secondary | ICD-10-CM | POA: Diagnosis not present

## 2017-08-16 DIAGNOSIS — J309 Allergic rhinitis, unspecified: Secondary | ICD-10-CM | POA: Diagnosis not present

## 2017-08-16 DIAGNOSIS — J322 Chronic ethmoidal sinusitis: Secondary | ICD-10-CM | POA: Diagnosis not present

## 2017-09-02 ENCOUNTER — Other Ambulatory Visit: Payer: Self-pay | Admitting: Osteopathic Medicine

## 2017-09-02 DIAGNOSIS — J329 Chronic sinusitis, unspecified: Secondary | ICD-10-CM | POA: Diagnosis not present

## 2017-09-02 DIAGNOSIS — I10 Essential (primary) hypertension: Secondary | ICD-10-CM

## 2017-09-05 DIAGNOSIS — J305 Allergic rhinitis due to food: Secondary | ICD-10-CM | POA: Diagnosis not present

## 2017-09-05 DIAGNOSIS — J301 Allergic rhinitis due to pollen: Secondary | ICD-10-CM | POA: Diagnosis not present

## 2017-09-16 ENCOUNTER — Ambulatory Visit (INDEPENDENT_AMBULATORY_CARE_PROVIDER_SITE_OTHER): Payer: Medicare HMO | Admitting: Osteopathic Medicine

## 2017-09-16 ENCOUNTER — Encounter: Payer: Self-pay | Admitting: Osteopathic Medicine

## 2017-09-16 DIAGNOSIS — M549 Dorsalgia, unspecified: Secondary | ICD-10-CM

## 2017-09-16 DIAGNOSIS — I10 Essential (primary) hypertension: Secondary | ICD-10-CM

## 2017-09-16 DIAGNOSIS — G8929 Other chronic pain: Secondary | ICD-10-CM

## 2017-09-16 MED ORDER — METOPROLOL SUCCINATE ER 50 MG PO TB24: ORAL_TABLET | ORAL | 0 refills | Status: DC

## 2017-09-16 MED ORDER — MELOXICAM 7.5 MG PO TABS: 7.5000 mg | ORAL_TABLET | Freq: Every day | ORAL | 1 refills | Status: DC | PRN

## 2017-09-16 NOTE — Patient Instructions (Addendum)
Plan: Metoprolol 200 mg tablets:  take 1 daily (200 mg total) for 3-5 days then take 1/2 tablet (100 mg total) for 3-5 days  Metoprolol 50 mg tablets:  take 1 daily (50 mg total) for 3-5 days then take 1/2 tablet (25 mg total) for 3-5 days

## 2017-09-16 NOTE — Progress Notes (Signed)
HPI: Zachary Lyons is a 72 y.o. male who  has a past medical history of Arthritis, GERD (gastroesophageal reflux disease), Hypertension, Peptic ulcer, and Transfusion history (04-22-13).  he presents to [facility] today, 09/16/17,  for chief complaint of:  Was told by allergist he needs to come off of beta blocker  Allergist is planning to put him on some kind of allergy shots/regimen, told him he needed to come off of the metoprolol. Any time he has stopped this medication in the past, has resulted in chest pain and palpitations.  Requests refill on meloxicam, helping lower back and knee pain   Past medical history, surgical history, social history and family history reviewed. No updates needed.   Current medication list and allergy/intolerance information reviewed.    Current Outpatient Medications on File Prior to Visit  Medication Sig Dispense Refill  . amoxicillin-clavulanate (AUGMENTIN) 875-125 MG tablet Take 1 tablet 2 (two) times daily by mouth. 14 tablet 0  . Azelastine-Fluticasone 137-50 MCG/ACT SUSP One spray each nostril BID 1 Bottle 11  . benzonatate (TESSALON) 200 MG capsule Take 1 capsule (200 mg total) by mouth 3 (three) times daily as needed for cough. 30 capsule 0  . diclofenac sodium (VOLTAREN) 1 % GEL Apply 2-4 g topically 4 (four) times daily as needed (pain). To affected joint/back. 100 g 11  . levofloxacin (LEVAQUIN) 500 MG tablet Take 1 tablet (500 mg total) by mouth daily. 7 tablet 0  . meloxicam (MOBIC) 7.5 MG tablet Take 1 tablet (7.5 mg total) by mouth daily as needed for pain. 90 tablet 0  . metoprolol (TOPROL-XL) 200 MG 24 hr tablet TAKE 1 AND 1/2 TABLETS EVERY MORNING 135 tablet 1  . omeprazole (PRILOSEC) 40 MG capsule TAKE 1 CAPSULE EVERY DAY 90 capsule 1  . oxymetazoline (AFRIN NASAL SPRAY) 0.05 % nasal spray Place 2 sprays 2 (two) times daily into both nostrils. For maximum 3 days, as needed for severe sinus  congestion 20 mL 0  . simvastatin (ZOCOR) 10 MG tablet TAKE 1 TABLET AT BEDTIME 90 tablet 1  . valsartan-hydrochlorothiazide (DIOVAN-HCT) 160-25 MG tablet Take 1 tablet by mouth daily. Due for follow up in January 2019. 90 tablet 0   No current facility-administered medications on file prior to visit.    No Known Allergies    Review of Systems:  Constitutional: No recent illness  HEENT: No  headache, no vision change  Cardiac: No  chest pain, No  pressure, No palpitations  Respiratory:  No  shortness of breath. No  Cough  Musculoskeletal: No new myalgia/arthralgia   Exam:  BP 121/71   Pulse 61   Temp 97.8 F (36.6 C) (Oral)   Wt 156 lb 1.9 oz (70.8 kg)   BMI 27.66 kg/m   Constitutional: VS see above. General Appearance: alert, well-developed, well-nourished, NAD  Eyes: Normal lids and conjunctive, non-icteric sclera  Ears, Nose, Mouth, Throat: MMM, Normal external inspection ears/nares/mouth/lips/gums.  Neck: No masses, trachea midline.   Respiratory: Normal respiratory effort. no wheeze, no rhonchi, no rales  Cardiovascular: S1/S2 normal, no murmur, no rub/gallop auscultated. RRR.   Musculoskeletal: Gait normal.     ASSESSMENT/PLAN: We'll taper off beta blocker, may likely need to substitute an alternative, close follow-up in the office as he comes down on this medicine.  Essential hypertension, benign  Chronic bilateral back pain, unspecified back location - Consider PT if no improvement with medications. Okay to use back brace. Exercise as tolerated - Plan:  meloxicam (MOBIC) 7.5 MG tablet     Meds ordered this encounter  Medications  . metoprolol succinate (TOPROL-XL) 50 MG 24 hr tablet    Sig: take 1 daily (50 mg total) for 3-5 days, then take 1/2 tablet (25 mg total) for 3-5 days Take with or immediately following a meal.    Dispense:  10 tablet    Refill:  0  . meloxicam (MOBIC) 7.5 MG tablet    Sig: Take 1 tablet (7.5 mg total) by mouth daily as  needed for pain.    Dispense:  90 tablet    Refill:  1    Patient Instructions  Plan: Metoprolol 200 mg tablets:  take 1 daily (200 mg total) for 3-5 days then take 1/2 tablet (100 mg total) for 3-5 days  Metoprolol 50 mg tablets:  take 1 daily (50 mg total) for 3-5 days then take 1/2 tablet (25 mg total) for 3-5 days     Follow-up plan: Return in about 1 week (around 09/23/2017) for recheck BP coming off Metoprolol .  Visit summary with medication list and pertinent instructions was printed for patient to review, alert [place] if any changes needed. All questions at time of visit were answered - patient instructed to contact office with any additional concerns. ER/RTC precautions were reviewed with the patient and understanding verbalized.   Note: Total time spent 25 minutes, greater than 50% of the visit was spent face-to-face counseling and coordinating care for the following: Diagnoses of Essential hypertension, benign and Chronic bilateral back pain, unspecified back location were pertinent to this visit.Marland Kitchen  Please note: voice recognition software was used to produce this document, and typos may escape review. Please contact Dr. Lyn Hollingshead for any needed clarifications.

## 2017-09-23 ENCOUNTER — Encounter: Payer: Self-pay | Admitting: Osteopathic Medicine

## 2017-09-23 ENCOUNTER — Ambulatory Visit (INDEPENDENT_AMBULATORY_CARE_PROVIDER_SITE_OTHER): Payer: Medicare HMO | Admitting: Osteopathic Medicine

## 2017-09-23 VITALS — BP 134/73 | HR 53 | Temp 97.6°F | Wt 160.0 lb

## 2017-09-23 DIAGNOSIS — J324 Chronic pansinusitis: Secondary | ICD-10-CM | POA: Diagnosis not present

## 2017-09-23 DIAGNOSIS — I10 Essential (primary) hypertension: Secondary | ICD-10-CM | POA: Diagnosis not present

## 2017-09-23 NOTE — Progress Notes (Signed)
HPI: Zachary Lyons is a 72 y.o. male who  has a past medical history of Arthritis, GERD (gastroesophageal reflux disease), Hypertension, Peptic ulcer, and Transfusion history (04-22-13).  he presents to [facility] today, 09/23/17,  for chief complaint of:  Was told by allergist he needs to come off of beta blocker - here for recheck BP   Allergist is planning to put him on some kind of allergy shots/regimen, told him he needed to come off of the metoprolol. Any time he has stopped this medication in the past, has resulted in chest pain and palpitations. We dicsussed last visit tapering down on this medicine slowly and possibly introducing alternative depending on BP numbers. Pt feeling fine today, no palpitations or chest pain, no headache/vision change, no SOB   BP last week 09/16/2017 was 121/71, today 134/73    Past medical history, surgical history, social history and family history reviewed. No updates needed.   Current medication list and allergy/intolerance information reviewed.    Current Outpatient Medications on File Prior to Visit  Medication Sig Dispense Refill  . Azelastine-Fluticasone 137-50 MCG/ACT SUSP One spray each nostril BID 1 Bottle 11  . benzonatate (TESSALON) 200 MG capsule Take 1 capsule (200 mg total) by mouth 3 (three) times daily as needed for cough. 30 capsule 0  . diclofenac sodium (VOLTAREN) 1 % GEL Apply 2-4 g topically 4 (four) times daily as needed (pain). To affected joint/back. 100 g 11  . meloxicam (MOBIC) 7.5 MG tablet Take 1 tablet (7.5 mg total) by mouth daily as needed for pain. 90 tablet 1  . metoprolol succinate (TOPROL-XL) 50 MG 24 hr tablet take 1 daily (50 mg total) for 3-5 days, then take 1/2 tablet (25 mg total) for 3-5 days Take with or immediately following a meal. 10 tablet 0  . omeprazole (PRILOSEC) 40 MG capsule TAKE 1 CAPSULE EVERY DAY 90 capsule 1  . oxymetazoline (AFRIN NASAL SPRAY) 0.05 %  nasal spray Place 2 sprays 2 (two) times daily into both nostrils. For maximum 3 days, as needed for severe sinus congestion 20 mL 0  . simvastatin (ZOCOR) 10 MG tablet TAKE 1 TABLET AT BEDTIME 90 tablet 1  . valsartan-hydrochlorothiazide (DIOVAN-HCT) 160-25 MG tablet Take 1 tablet by mouth daily. Due for follow up in January 2019. 90 tablet 0   No current facility-administered medications on file prior to visit.    No Known Allergies    Review of Systems:  Constitutional: No recent illness  HEENT: No  headache, no vision change  Cardiac: No  chest pain, No  pressure, No palpitations  Respiratory:  No  shortness of breath. No  Cough  Musculoskeletal: No new myalgia/arthralgia   Exam:  BP 134/73   Pulse (!) 53   Temp 97.6 F (36.4 C) (Oral)   Wt 160 lb 0.6 oz (72.6 kg)   BMI 28.35 kg/m   Constitutional: VS see above. General Appearance: alert, well-developed, well-nourished, NAD  Respiratory: Normal respiratory effort. no wheeze, no rhonchi, no rales  Cardiovascular: S1/S2 normal, no murmur, no rub/gallop auscultated. RRR.   Musculoskeletal: Gait normal.     ASSESSMENT/PLAN: We'll taper off beta blocker, may likely need to substitute an alternative, close follow-up in the office as he comes down on this medicine. No changes right now, he is feeling well, may consider adding amlodipine depending on BP next visit   Essential hypertension, benign  Chronic pansinusitis       Follow-up plan: Return  for recheck BP in one week, sooner if needed .  Visit summary with medication list and pertinent instructions was printed for patient to review, alert us if any changes needed. All questions at time of visit were answered - patient instructed to contact office with any additional concerns. ER/RTC precautions were reviewed with the patient and understanding verbalized.   Note: Total time spent 10 minutes, greater than 50% of the visit was spent face-to-face counseling and  coordinating care for the following: The primary encounter diagnosis was Essential hypertension, benign. A diagnosis of Chronic pansinusitis was also pertinent to this visit.Marland Kitchen.  Please note: voice recognition software was used to produce this document, and typos may escape review. Please contact Dr. Lyn HollingsheadAlexander for any needed clarifications.

## 2017-09-30 ENCOUNTER — Ambulatory Visit (INDEPENDENT_AMBULATORY_CARE_PROVIDER_SITE_OTHER): Payer: Medicare HMO | Admitting: Osteopathic Medicine

## 2017-09-30 ENCOUNTER — Encounter: Payer: Self-pay | Admitting: Osteopathic Medicine

## 2017-09-30 VITALS — BP 127/60 | HR 76 | Temp 97.7°F | Wt 161.1 lb

## 2017-09-30 DIAGNOSIS — J324 Chronic pansinusitis: Secondary | ICD-10-CM

## 2017-09-30 DIAGNOSIS — I10 Essential (primary) hypertension: Secondary | ICD-10-CM

## 2017-09-30 MED ORDER — VALSARTAN-HYDROCHLOROTHIAZIDE 160-25 MG PO TABS: 1.0000 | ORAL_TABLET | Freq: Every day | ORAL | 3 refills | Status: DC

## 2017-09-30 MED ORDER — METOPROLOL SUCCINATE ER 200 MG PO TB24: 200.0000 mg | ORAL_TABLET | Freq: Every day | ORAL | 11 refills | Status: DC

## 2017-09-30 NOTE — Progress Notes (Signed)
HPI: Zachary Lyons is a 72 y.o. male who  has a past medical history of Arthritis, GERD (gastroesophageal reflux disease), Hypertension, Peptic ulcer, and Transfusion history (04-22-13).  he presents to [facility] today, 09/30/17,  for chief complaint of:  Was told by allergist he needs to come off of beta blocker - here for recheck BP   Allergist is planning to put him on some kind of allergy shots/regimen, told him he needed to come off of the metoprolol. Any time he has stopped this medication in the past, has resulted in chest pain and palpitations. We dicsussed at initial visit for this issue - tapering down on this medicine slowly and possibly introducing alternative depending on BP numbers.   Pt feeling fine today, no palpitations or chest pain, no headache/vision change, no SOB.   Was going down on metoprolol but he states that his arm felt funny in the night (No CP/SOB, no HA/VC,no dizzy) and he attributed this to the BP meds and went back up to 200 mg dose. Feels fine now.   To get to the bottom of the issue - his wife states that the allergist told them they wanted to try pills/spray for the allergies, if THAT wasn't working, THEN he'd need some kind of SL drops which MIGHT need epi-pen use which MIGHT not work because of the metoprolol...  BP 09/16/2017 was 121/71, 09/23/2017 was 134/73 Today, 09/30/17 BP is 127/60     Past medical history, surgical history, social history and family history reviewed. No updates needed.   Current medication list and allergy/intolerance information reviewed.    Current Outpatient Medications on File Prior to Visit  Medication Sig Dispense Refill  . Azelastine-Fluticasone 137-50 MCG/ACT SUSP One spray each nostril BID 1 Bottle 11  . diclofenac sodium (VOLTAREN) 1 % GEL Apply 2-4 g topically 4 (four) times daily as needed (pain). To affected joint/back. 100 g 11  . meloxicam (MOBIC) 7.5 MG tablet Take 1  tablet (7.5 mg total) by mouth daily as needed for pain. 90 tablet 1  . metoprolol succinate (TOPROL-XL) 50 MG 24 hr tablet take 1 daily (50 mg total) for 3-5 days, then take 1/2 tablet (25 mg total) for 3-5 days Take with or immediately following a meal. 10 tablet 0  . omeprazole (PRILOSEC) 40 MG capsule TAKE 1 CAPSULE EVERY DAY 90 capsule 1  . simvastatin (ZOCOR) 10 MG tablet TAKE 1 TABLET AT BEDTIME 90 tablet 1  . valsartan-hydrochlorothiazide (DIOVAN-HCT) 160-25 MG tablet Take 1 tablet by mouth daily. Due for follow up in January 2019. 90 tablet 0   No current facility-administered medications on file prior to visit.    No Known Allergies    Review of Systems:  Constitutional: No recent illness  HEENT: No  headache, no vision change  Cardiac: No  chest pain, No  pressure, No palpitations  Respiratory:  No  shortness of breath. No  Cough  Musculoskeletal: No new myalgia/arthralgia   Exam:  BP 127/60 (BP Location: Left Arm)   Pulse 76   Temp 97.7 F (36.5 C) (Oral)   Wt 161 lb 1.9 oz (73.1 kg)   BMI 28.54 kg/m    Constitutional: VS see above. General Appearance: alert, well-developed, well-nourished, NAD  Respiratory: Normal respiratory effort. no wheeze, no rhonchi, no rales  Cardiovascular: S1/S2 normal, no murmur, no rub/gallop auscultated. RRR.   Musculoskeletal: Gait normal.     ASSESSMENT/PLAN:   I can't find any hard numbers  about risk of epinephrine being ineffective in patients on selective beta blockers, but of course the connection is there for epinephrine effectiveness as a beta agonist to be decreased by the beta blockade effects of metoprolol and similar meds. Ok, but... the patient is not even in need of any therapy which might necessitate epinephrine at this time, so I hesitate to say that he HAS to come off the metoprolol right now if he feels better on it. Of course this may change if they opt for the sublingual drops of what sounds like immunotherapy,  but if the other allergy/sinus treatments are working, this is a Dietitian.   Arm symptoms sound more MSK in nature, I advised pt I don't feel this symptom is d/t metoprolol decrease but he is not convinced and he wants to stay on this medicine.    Will reach out to Dr Marisa Sprinkles' office regarding all this. UPDATE: they state he'll need to come off metoprolol if he opts for allergy shots. I said I was totally fine with him coming off the medicine, but he has been resistant to it. At this point, it is his decision. I have advised him that I don't think his arm pain is due to lower dose of metoprolol nor to any concerning cardiac issue.   Essential hypertension, benign - Plan: valsartan-hydrochlorothiazide (DIOVAN-HCT) 160-25 MG tablet  Chronic pansinusitis       Follow-up plan: Return for recheck depending what Dr Maryclare Labrador says .  Visit summary with medication list and pertinent instructions was printed for patient to review, alert [place] if any changes needed. All questions at time of visit were answered - patient instructed to contact office with any additional concerns. ER/RTC precautions were reviewed with the patient and understanding verbalized.   Note: Total time spent 25 minutes, greater than 50% of the visit was spent face-to-face counseling and coordinating care for the following: The primary encounter diagnosis was Essential hypertension, benign. A diagnosis of Chronic pansinusitis was also pertinent to this visit.Marland Kitchen  Please note: voice recognition software was used to produce this document, and typos may escape review. Please contact Dr. Lyn Hollingshead for any needed clarifications.

## 2017-10-01 ENCOUNTER — Encounter: Payer: Self-pay | Admitting: Osteopathic Medicine

## 2017-10-01 ENCOUNTER — Telehealth: Payer: Self-pay | Admitting: Osteopathic Medicine

## 2017-10-01 NOTE — Telephone Encounter (Signed)
-----   Message from Delfino LovettVanicia L Hernandez, [place] sent at 10/01/2017  9:58 AM EDT ----- Regarding: RE: FYI/clarification for ENT re metoprolol  Contacted Dr. Marisa SprinklesPhipps office, requested to speak with his assistan but was out of the office at time of call. Spoke to Sprint Nextel CorporationKim - she will pass on msg to Dr. Marisa SprinklesPhipps. At provider's request, cell number information submitted to assistant for call back.   ----- Message ----- From: Sunnie NielsenAlexander, Mala Gibbard, DO Sent: 09/30/2017   2:37 PM To: Delfino LovettVanicia L Hernandez, CMA Subject: FYI/clarification for ENT re metoprolol        Glendora ScorePhipps, Carl D, MD  Phone: [phone]   Please call Dr Marisa SprinklesPhipps' office. Mr Lonna CobbRomero told me that Dr Marisa SprinklesPhipps states he HAS TO come off metoprolol due to possible interaction with epinephrine. At this point, the patient is not on immune therapy and no epinephrine needed, so I don't see a reason he has to come off metoprolol UNLESS he opts for immune therapy.    He has had a lot of problems coming off the metoprolol, so we are continuing it for now. If Dr Marisa SprinklesPhipps has any concerns about this, I'm happy to speak to him - the story from the patient and his wife is a bit unclear to me. They can also send us records - I can't see anything on Epic. My cell is [phone] if needed. Thanks!

## 2017-10-01 NOTE — Telephone Encounter (Signed)
Poke with nurse at [facility], they stated that patient's current treatment was probably insufficient to control his allergies, they were trying to get drops covered but these are too expensive for his insurance. If the patient does decide to get on allergy shots, he would need to come off of the metoprolol at that point due to issues with metoprolol making epinephrine less certain it to work in the case of an anaphylactic reaction.  I let her know that we had tried tapering down on the metoprolol, the patient for one reason or another has not felt comfortable doing this even under close supervision. I certainly don't think he HAS TO be taking the metoprolol. I'm happy to help him come off of this, but it is his decision whether he wants to start the allergy shots and proceed with tapering off the medicine

## 2017-10-14 DIAGNOSIS — J339 Nasal polyp, unspecified: Secondary | ICD-10-CM | POA: Diagnosis not present

## 2017-10-14 DIAGNOSIS — J329 Chronic sinusitis, unspecified: Secondary | ICD-10-CM | POA: Diagnosis not present

## 2017-10-28 ENCOUNTER — Other Ambulatory Visit: Payer: Self-pay | Admitting: Osteopathic Medicine

## 2017-10-28 DIAGNOSIS — K219 Gastro-esophageal reflux disease without esophagitis: Secondary | ICD-10-CM

## 2017-11-13 DIAGNOSIS — J329 Chronic sinusitis, unspecified: Secondary | ICD-10-CM | POA: Diagnosis not present

## 2017-11-13 DIAGNOSIS — J3089 Other allergic rhinitis: Secondary | ICD-10-CM | POA: Diagnosis not present

## 2017-11-14 ENCOUNTER — Ambulatory Visit (INDEPENDENT_AMBULATORY_CARE_PROVIDER_SITE_OTHER): Payer: Medicare HMO | Admitting: Osteopathic Medicine

## 2017-11-14 ENCOUNTER — Encounter: Payer: Self-pay | Admitting: Osteopathic Medicine

## 2017-11-14 VITALS — BP 171/87 | HR 58 | Temp 98.0°F | Wt 163.9 lb

## 2017-11-14 DIAGNOSIS — Z87898 Personal history of other specified conditions: Secondary | ICD-10-CM | POA: Diagnosis not present

## 2017-11-14 DIAGNOSIS — J324 Chronic pansinusitis: Secondary | ICD-10-CM | POA: Diagnosis not present

## 2017-11-14 DIAGNOSIS — I10 Essential (primary) hypertension: Secondary | ICD-10-CM

## 2017-11-14 DIAGNOSIS — R972 Elevated prostate specific antigen [PSA]: Secondary | ICD-10-CM | POA: Diagnosis not present

## 2017-11-14 DIAGNOSIS — R05 Cough: Secondary | ICD-10-CM | POA: Diagnosis not present

## 2017-11-14 DIAGNOSIS — Z125 Encounter for screening for malignant neoplasm of prostate: Secondary | ICD-10-CM | POA: Diagnosis not present

## 2017-11-14 DIAGNOSIS — R7303 Prediabetes: Secondary | ICD-10-CM | POA: Diagnosis not present

## 2017-11-14 DIAGNOSIS — R059 Cough, unspecified: Secondary | ICD-10-CM

## 2017-11-14 LAB — POCT GLYCOSYLATED HEMOGLOBIN (HGB A1C): Hemoglobin A1C: 6

## 2017-11-14 MED ORDER — VALSARTAN-HYDROCHLOROTHIAZIDE 320-25 MG PO TABS: 1.0000 | ORAL_TABLET | Freq: Every day | ORAL | 1 refills | Status: DC

## 2017-11-14 NOTE — Progress Notes (Signed)
HPI: Zachary Lyons is a 72 y.o. male who  has a past medical history of Arthritis, GERD (gastroesophageal reflux disease), Hypertension, Peptic ulcer, and Transfusion history (04-22-13).  he presents to [facility] today, 11/14/17,  for chief complaint of:  HTN  HTN: Taking 1/2 tab Metoprolol daily, was on whole tablet of 200 mg dose. BP not at goal on initial check or on recheck.   Prediabetes: sugars are stable, relative compliance with low carb diet.   Sinuses: much improved. Not clear if he will be needing allergy shots.   Patient is accompanied by wife who assists with history-taking.   Past medical history, surgical history, and family history reviewed.  Current medication list and allergy/intolerance information reviewed.   (See remainder of HPI, as well as ROS, PE below)    Results for orders placed or performed in visit on 11/14/17 (from the past 72 hour(s))  POCT HgB A1C     Status: None   Collection Time: 11/14/17  2:40 PM  Result Value Ref Range   Hemoglobin A1C 6.0      ASSESSMENT/PLAN:   Pre-diabetes - Plan: POCT HgB A1C  History of elevated PSA - Plan: PSA  Essential hypertension, benign  Chronic pansinusitis - will leave on 1/2 tab metoprolol, pt states allergist put him on this dose, since we can just increase the valsartan I'm ok to do that and have him f/u here   Cough - stay on PPI for now    Meds ordered this encounter  Medications  . valsartan-hydrochlorothiazide (DIOVAN-HCT) 320-25 MG tablet    Sig: Take 1 tablet by mouth daily.    Dispense:  30 tablet    Refill:  1  Increase valsartan component of combo pill.    Follow-up plan: Return in about 2 weeks (around 11/28/2017) for recheck BP (w/ Dr Lyn Hollingshead not nurse) .    ################################## ################################## ##################################   Outpatient Encounter Medications as of 11/14/2017  Medication Sig Note   . Azelastine-Fluticasone 137-50 MCG/ACT SUSP One spray each nostril BID   . diclofenac sodium (VOLTAREN) 1 % GEL Apply 2-4 g topically 4 (four) times daily as needed (pain). To affected joint/back.   . meloxicam (MOBIC) 7.5 MG tablet Take 1 tablet (7.5 mg total) by mouth daily as needed for pain.   . metoprolol (TOPROL-XL) 200 MG 24 hr tablet Take 1 tablet (200 mg total) by mouth daily. 11/14/2017: As per pt - taking 1/2 tab (100 mg) daily  . omeprazole (PRILOSEC) 40 MG capsule TAKE 1 CAPSULE EVERY DAY   . simvastatin (ZOCOR) 10 MG tablet TAKE 1 TABLET AT BEDTIME   . valsartan-hydrochlorothiazide (DIOVAN-HCT) 160-25 MG tablet Take 1 tablet by mouth daily. Due for follow up in January 2019.    No facility-administered encounter medications on file as of 11/14/2017.    No Known Allergies    Review of Systems:  Constitutional: No recent illness  HEENT: No  headache, no vision change  Cardiac: No  chest pain, No  pressure, No palpitations  Respiratory:  No  shortness of breath. No  Cough  Gastrointestinal: No  abdominal pain, no change on bowel habits  Musculoskeletal: No new myalgia/arthralgia  Skin: No  Rash  Hem/Onc: No  easy bruising/bleeding, No  abnormal lumps/bumps  Neurologic: No  weakness, No  Dizziness  Psychiatric: No  concerns with depression, No  concerns with anxiety  Exam:  BP (!) 171/87 (BP Location: Left Arm, Patient Position: Sitting, Cuff Size: Normal)  Pulse (!) 58   Temp 98 F (36.7 C) (Oral)   Wt 163 lb 14.4 oz (74.3 kg)   BMI 29.03 kg/m   Constitutional: VS see above. General Appearance: alert, well-developed, well-nourished, NAD  Eyes: Normal lids and conjunctive, non-icteric sclera  Ears, Nose, Mouth, Throat: MMM, Normal external inspection ears/nares/mouth/lips/gums.  Neck: No masses, trachea midline.   Respiratory: Normal respiratory effort. no wheeze, no rhonchi, no rales  Cardiovascular: S1/S2 normal, no murmur, no rub/gallop auscultated.  RRR.   Musculoskeletal: Gait normal. Symmetric and independent movement of all extremities  Neurological: Normal balance/coordination. No tremor.  Skin: warm, dry, intact.   Psychiatric: Normal judgment/insight. Normal mood and affect. Oriented x3.   Visit summary with medication list and pertinent instructions was printed for patient to review, alert [place] if any changes needed. All questions at time of visit were answered - patient instructed to contact office with any additional concerns. ER/RTC precautions were reviewed with the patient and understanding verbalized.   Follow-up plan: Return in about 2 weeks (around 11/28/2017) for recheck BP (w/ Dr Lyn Hollingshead not nurse) .  Note: Total time spent 25 minutes, greater than 50% of the visit was spent face-to-face counseling and coordinating care for the following: The primary encounter diagnosis was Pre-diabetes. Diagnoses of History of elevated PSA, Essential hypertension, benign, Chronic pansinusitis, and Cough were also pertinent to this visit.Marland Kitchen  Please note: voice recognition software was used to produce this document, and typos may escape review. Please contact Dr. Lyn Hollingshead for any needed clarifications.

## 2017-11-15 LAB — PSA: PSA: 2.5 ng/mL (ref <=4.0)

## 2017-11-28 ENCOUNTER — Ambulatory Visit (INDEPENDENT_AMBULATORY_CARE_PROVIDER_SITE_OTHER): Payer: Medicare HMO | Admitting: Osteopathic Medicine

## 2017-11-28 ENCOUNTER — Encounter: Payer: Self-pay | Admitting: Osteopathic Medicine

## 2017-11-28 VITALS — BP 148/81 | HR 73 | Temp 97.8°F | Wt 163.9 lb

## 2017-11-28 DIAGNOSIS — I10 Essential (primary) hypertension: Secondary | ICD-10-CM

## 2017-11-28 DIAGNOSIS — R350 Frequency of micturition: Secondary | ICD-10-CM | POA: Diagnosis not present

## 2017-11-28 LAB — POCT URINALYSIS DIPSTICK
Bilirubin, UA: NEGATIVE
Blood, UA: NEGATIVE
GLUCOSE UA: NEGATIVE
KETONES UA: NEGATIVE
LEUKOCYTES UA: NEGATIVE
NITRITE UA: NEGATIVE
PROTEIN UA: NEGATIVE
SPEC GRAV UA: 1.01 (ref 1.010–1.025)
Urobilinogen, UA: 0.2 E.U./dL
pH, UA: 7 (ref 5.0–8.0)

## 2017-11-28 NOTE — Patient Instructions (Signed)
Plan: See printed med list for medications you should be taking Let's plan to see me in another 2-4 weeks to recheck BP  If it's good then, we can wait to see me until you are due for sugar recheck  Goal BP 140 top number, 80-90 bottom number

## 2017-11-28 NOTE — Progress Notes (Signed)
HPI: Zachary Lyons is a 72 y.o. male who  has a past medical history of Arthritis, GERD (gastroesophageal reflux disease), Hypertension, Peptic ulcer, and Transfusion history (04-22-13).  he presents to [facility] today, 11/28/17,  for chief complaint of:  HTN  HTN: Some uncertainty if he is on one whole or 1/2 tab Metoprolol daily, patient states he may not be taking it at all, was on whole tablet of 200 mg dose previously. BP not at goal on last visit so we increased valsartan-HCT to 320-25 mg and he is here to recheck. BP definitely better on recheck but not quite at goal, again he may not be on the metoprolol, he states he also has doubled up on the Val-HCT 160-25 AND he is taking the 320-25!   He also notes urinary frequency more at night. No dysuria. He wisely inquires about diuretics - I let him know that doubling up on the valsartan-HCT might have a role here   BP (!) 148/81 (BP Location: Left Arm, Patient Position: Sitting, Cuff Size: Large)   Pulse 73   Temp 97.8 F (36.6 C) (Oral)   Wt 163 lb 14.4 oz (74.3 kg)   BMI 29.03 kg/m    Patient is accompanied by wife who assists with history-taking.   Past medical history, surgical history, and family history reviewed.  Current medication list and allergy/intolerance information reviewed.   (See remainder of HPI, as well as ROS, PE below)    Results for orders placed or performed in visit on 11/28/17 (from the past 72 hour(s))  POCT Urinalysis Dipstick     Status: None   Collection Time: 11/28/17  2:16 PM  Result Value Ref Range   Color, UA YELLOW    Clarity, UA CLEAR    Glucose, UA NEGATIVE    Bilirubin, UA NEGATIVE    Ketones, UA NEGATIVE    Spec Grav, UA 1.010 1.010 - 1.025   Blood, UA NEGATIVE    pH, UA 7.0 5.0 - 8.0   Protein, UA NEGATIVE    Urobilinogen, UA 0.2 0.2 or 1.0 E.U./dL   Nitrite, UA NEGATIVE    Leukocytes, UA Negative Negative   Appearance     Odor        ASSESSMENT/PLAN:   Essential hypertension, benign  Urine frequency - Plan: POCT Urinalysis Dipstick,  See visit summary for med list Bring meds to next visit! There is clearly confusion with him and his wife over what he should be taking. I went over med list on AVS w/ wife.   Patient Instructions  Plan: See printed med list for medications you should be taking Let's plan to see me in another 2-4 weeks to recheck BP  If it's good then, we can wait to see me until you are due for sugar recheck  Goal BP 140 top number, 80-90 bottom number      Follow-up plan: Return in about 2 weeks (around 12/12/2017) for recheck BP w/ Dr A .    ################################## ################################## ##################################   Outpatient Encounter Medications as of 11/28/2017  Medication Sig Note  . Azelastine-Fluticasone 137-50 MCG/ACT SUSP One spray each nostril BID   . diclofenac sodium (VOLTAREN) 1 % GEL Apply 2-4 g topically 4 (four) times daily as needed (pain). To affected joint/back.   . meloxicam (MOBIC) 7.5 MG tablet Take 1 tablet (7.5 mg total) by mouth daily as needed for pain.   . metoprolol (TOPROL-XL) 200 MG 24 hr tablet Take  1 tablet (200 mg total) by mouth daily. 11/14/2017: As per pt - taking 1/2 tab (100 mg) daily  . omeprazole (PRILOSEC) 40 MG capsule TAKE 1 CAPSULE EVERY DAY   . simvastatin (ZOCOR) 10 MG tablet TAKE 1 TABLET AT BEDTIME   . valsartan-hydrochlorothiazide (DIOVAN-HCT) 320-25 MG tablet Take 1 tablet by mouth daily.    No facility-administered encounter medications on file as of 11/28/2017.    No Known Allergies    Review of Systems:  Constitutional: No recent illness  HEENT: No  headache, no vision change  Cardiac: No  chest pain, No  pressure, No palpitations  Respiratory:  No  shortness of breath. No  Cough  Gastrointestinal: No  abdominal pain, no change on bowel habits  Musculoskeletal: No new  myalgia/arthralgia  Skin: No  Rash  Neurologic: No  weakness, No  Dizziness   Exam:  BP (!) 148/81 (BP Location: Left Arm, Patient Position: Sitting, Cuff Size: Large)   Pulse 73   Temp 97.8 F (36.6 C) (Oral)   Wt 163 lb 14.4 oz (74.3 kg)   BMI 29.03 kg/m   Constitutional: VS see above. General Appearance: alert, well-developed, well-nourished, NAD  Eyes: Normal lids and conjunctive, non-icteric sclera  Ears, Nose, Mouth, Throat: MMM, Normal external inspection ears/nares/mouth/lips/gums.  Neck: No masses, trachea midline.   Respiratory: Normal respiratory effort. no wheeze, no rhonchi, no rales  Cardiovascular: S1/S2 normal, no murmur, no rub/gallop auscultated. RRR.   Musculoskeletal: Gait normal. Symmetric and independent movement of all extremities  Neurological: Normal balance/coordination. No tremor.  Skin: warm, dry, intact.   Psychiatric: Normal judgment/insight. Normal mood and affect. Oriented x3.   Visit summary with medication list and pertinent instructions was printed for patient to review, alert [place] if any changes needed. All questions at time of visit were answered - patient instructed to contact office with any additional concerns. ER/RTC precautions were reviewed with the patient and understanding verbalized.   Follow-up plan: Return in about 2 weeks (around 12/12/2017) for recheck BP w/ Dr A .  Note: Total time spent 25 minutes, greater than 50% of the visit was spent face-to-face counseling and coordinating care for the following: The primary encounter diagnosis was Essential hypertension, benign. A diagnosis of Urine frequency was also pertinent to this visit.Marland Kitchen  Please note: voice recognition software was used to produce this document, and typos may escape review. Please contact Dr. Lyn Hollingshead for any needed clarifications.

## 2017-11-29 LAB — URINE CULTURE
MICRO NUMBER:: 90598281
Result:: NO GROWTH
SPECIMEN QUALITY:: ADEQUATE

## 2017-11-29 LAB — URINALYSIS, MICROSCOPIC ONLY
BACTERIA UA: NONE SEEN /HPF
HYALINE CAST: NONE SEEN /LPF
RBC / HPF: NONE SEEN /HPF (ref 0–2)
Squamous Epithelial / LPF: NONE SEEN /HPF (ref <=5)
WBC UA: NONE SEEN /HPF (ref 0–5)

## 2017-12-24 ENCOUNTER — Ambulatory Visit: Payer: Medicare HMO | Admitting: Osteopathic Medicine

## 2017-12-30 ENCOUNTER — Encounter: Payer: Self-pay | Admitting: Osteopathic Medicine

## 2017-12-30 ENCOUNTER — Ambulatory Visit (INDEPENDENT_AMBULATORY_CARE_PROVIDER_SITE_OTHER): Payer: Medicare HMO | Admitting: Osteopathic Medicine

## 2017-12-30 VITALS — BP 127/61 | HR 60 | Temp 98.1°F | Wt 166.3 lb

## 2017-12-30 DIAGNOSIS — R42 Dizziness and giddiness: Secondary | ICD-10-CM

## 2017-12-30 DIAGNOSIS — I10 Essential (primary) hypertension: Secondary | ICD-10-CM

## 2017-12-30 MED ORDER — METOPROLOL SUCCINATE ER 50 MG PO TB24: 150.0000 mg | ORAL_TABLET | Freq: Every day | ORAL | 1 refills | Status: DC

## 2017-12-30 NOTE — Progress Notes (Signed)
HPI: Zachary Lyons is a 72 y.o. male who  has a past medical history of Arthritis, GERD (gastroesophageal reflux disease), Hypertension, Peptic ulcer, and Transfusion history (04-22-13).  he presents to [facility] today, 12/30/17,  for chief complaint of:  BP follow-up   BP looking much better today, but he reports "headache" on occasion - further history reveals no pain but a few occasionas, 3 times a week past few weeks, he has lightheadedness, feels weak. No SOB, no CP, no vertigo/spinning, no vision changes. Comes inside and takes Tylenol and lies down, feels better. Seems to be just after working outside. Wife reports she has taken BP on one of these occasions and systolic was 120's.    Past medical history, surgical history, and family history reviewed.  Current medication list and allergy/intolerance information reviewed.   (See remainder of HPI, ROS, Phys Exam below)  BP 127/61 (BP Location: Left Arm, Patient Position: Sitting, Cuff Size: Normal)   Pulse 60   Temp 98.1 F (36.7 C) (Oral)   Wt 166 lb 4.8 oz (75.4 kg)   BMI 29.46 kg/m   Orthostatic VS for the past 24 hrs:  BP- Lying Pulse- Lying BP- Sitting Pulse- Sitting BP- Standing at 0 minutes Pulse- Standing at 0 minutes  12/30/17 1613 155/78 56 166/75 54 162/75 54   EKG interpretation: Rate: 52 Rhythm: sinus No ST/T changes concerning for acute ischemia/infarct  Previous EKG no tracings available for comparison      ASSESSMENT/PLAN:   Essential hypertension, benign  Episodic lightheadedness - Plan: EKG 12-Lead   Meds ordered this encounter  Medications  . metoprolol succinate (TOPROL-XL) 50 MG 24 hr tablet    Sig: Take 3 tablets (150 mg total) by mouth daily. Take with or immediately following a meal. STOP 200 mg dose for now    Dispense:  90 tablet    Refill:  1    Patient Instructions  We may have done too good of a job fixing your blood pressure, and now it  is dropping a bit low and causing you to be light-headed sometimes. This can happen more in the heat, so be careful going outside or while doing more activities.   Will reduce the metoprolol to 150 mg instead of 200 mg.   Keep track of your BP at home daily and when/if you're feeling unwell.   Let's see you back here in 2-3 weeks to recheck things, sooner if any change or if you're feeling worse!     Follow-up plan: Return for recheck BP 2-3 weeks w/ Dr Zachary HollingsheadAlexander .     ############################################ ############################################ ############################################ ############################################    Outpatient Encounter Medications as of 12/30/2017  Medication Sig Note  . ACETAMINOPHEN 8 HOUR PO Take by mouth.   . Azelastine-Fluticasone 137-50 MCG/ACT SUSP One spray each nostril BID   . diclofenac sodium (VOLTAREN) 1 % GEL Apply 2-4 g topically 4 (four) times daily as needed (pain). To affected joint/back.   . meloxicam (MOBIC) 7.5 MG tablet Take 1 tablet (7.5 mg total) by mouth daily as needed for pain.   . metoprolol (TOPROL-XL) 200 MG 24 hr tablet Take 1 tablet (200 mg total) by mouth daily. 11/14/2017: As per pt - taking 1/2 tab (100 mg) daily  . omeprazole (PRILOSEC) 40 MG capsule TAKE 1 CAPSULE EVERY DAY   . simvastatin (ZOCOR) 10 MG tablet TAKE 1 TABLET AT BEDTIME   . valsartan-hydrochlorothiazide (DIOVAN-HCT) 320-25 MG tablet Take 1 tablet by  mouth daily.    No facility-administered encounter medications on file as of 12/30/2017.    No Known Allergies    Review of Systems:  Constitutional: No recent illness  HEENT: + "headache" as per HPI, no vision change  Cardiac: No  chest pain, No  pressure, No palpitations  Respiratory:  No  shortness of breath. No  Cough  Gastrointestinal: No  abdominal pain, no change on bowel habits  Skin: No  Rash  Hem/Onc: No  easy bruising/bleeding, No  abnormal  lumps/bumps  Neurologic: No  weakness, +Dizziness as per HPI  Psychiatric: No  concerns with depression, No  concerns with anxiety  Exam:  BP 127/61 (BP Location: Left Arm, Patient Position: Sitting, Cuff Size: Normal)   Pulse 60   Temp 98.1 F (36.7 C) (Oral)   Wt 166 lb 4.8 oz (75.4 kg)   BMI 29.46 kg/m   Constitutional: VS see above. General Appearance: alert, well-developed, well-nourished, NAD  Eyes: Normal lids and conjunctive, non-icteric sclera  Ears, Nose, Mouth, Throat: MMM, Normal external inspection ears/nares/mouth/lips/gums.  Neck: No masses, trachea midline.   Respiratory: Normal respiratory effort. no wheeze, no rhonchi, no rales  Cardiovascular: S1/S2 normal, no murmur, no rub/gallop auscultated. RRR.   Musculoskeletal: Gait normal.   Neurological: Normal balance/coordination. No tremor.  Skin: warm, dry, intact.   Psychiatric: Normal judgment/insight. Normal mood and affect. Oriented x3.   Visit summary with medication list and pertinent instructions was printed for patient to review, advised to alert [place] if any changes needed. All questions at time of visit were answered - patient instructed to contact office with any additional concerns. ER/RTC precautions were reviewed with the patient and understanding verbalized.   Follow-up plan: Return for recheck BP 2-3 weeks w/ Dr Zachary Lyons .    Please note: voice recognition software was used to produce this document, and typos may escape review. Please contact Dr. Lyn Lyons for any needed clarifications.

## 2017-12-30 NOTE — Patient Instructions (Addendum)
We may have done too good of a job fixing your blood pressure, and now it is dropping a bit low and causing you to be light-headed sometimes. This can happen more in the heat, so be careful going outside or while doing more activities.   Will reduce the metoprolol to 150 mg instead of 200 mg.   Keep track of your BP at home daily and when/if you're feeling unwell.   Let's see you back here in 2-3 weeks to recheck things, sooner if any change or if you're feeling worse!

## 2018-01-09 DIAGNOSIS — Z96652 Presence of left artificial knee joint: Secondary | ICD-10-CM | POA: Diagnosis not present

## 2018-01-09 DIAGNOSIS — Z471 Aftercare following joint replacement surgery: Secondary | ICD-10-CM | POA: Diagnosis not present

## 2018-01-09 DIAGNOSIS — M1711 Unilateral primary osteoarthritis, right knee: Secondary | ICD-10-CM | POA: Diagnosis not present

## 2018-01-15 ENCOUNTER — Ambulatory Visit (INDEPENDENT_AMBULATORY_CARE_PROVIDER_SITE_OTHER): Payer: Medicare HMO | Admitting: Osteopathic Medicine

## 2018-01-15 ENCOUNTER — Encounter: Payer: Self-pay | Admitting: Osteopathic Medicine

## 2018-01-15 VITALS — BP 145/72 | HR 52 | Temp 97.9°F | Wt 165.5 lb

## 2018-01-15 DIAGNOSIS — Z01818 Encounter for other preprocedural examination: Secondary | ICD-10-CM

## 2018-01-15 DIAGNOSIS — Z419 Encounter for procedure for purposes other than remedying health state, unspecified: Secondary | ICD-10-CM | POA: Diagnosis not present

## 2018-01-15 DIAGNOSIS — E871 Hypo-osmolality and hyponatremia: Secondary | ICD-10-CM | POA: Diagnosis not present

## 2018-01-15 DIAGNOSIS — I10 Essential (primary) hypertension: Secondary | ICD-10-CM | POA: Diagnosis not present

## 2018-01-15 NOTE — Progress Notes (Signed)
HPI: Zachary Lyons is a 72 y.o. male who  has a past medical history of Arthritis, GERD (gastroesophageal reflux disease), Hypertension, Peptic ulcer, and Transfusion history (04-22-13).  [facility] today, 01/15/18,  for chief complaint of:  BP check-up Needs paperwork for surgery in 03/2018   BP: systolic a bit above goal, diastolic ok. Have had some doffoculty getting his BP under control d/t some confusion w/ medications dose, see previous notes. No chest pain, no SOB, pt feels well. Needs approval for Metoprolol, taking 100 AM, 50 PM   Surgery for TKA in September, needs physical/form filled out and labs   Patient is accompanied by wife who assists with history-taking.   Past medical history, surgical history, and family history reviewed.  Current medication list and allergy/intolerance information reviewed.   (See remainder of HPI, ROS, Phys Exam below)  BP (!) 145/72 (BP Location: Left Arm, Patient Position: Sitting, Cuff Size: Normal)   Pulse (!) 52   Temp 97.9 F (36.6 C) (Oral)   Wt 165 lb 8 oz (75.1 kg)   BMI 29.32 kg/m     ASSESSMENT/PLAN:   Essential hypertension, benign - Plan: CBC with Differential/Platelet, COMPLETE METABOLIC PANEL WITH GFR, Hemoglobin A1c, Urinalysis, Routine w reflex microscopic  Surgery, elective - Plan: CBC with Differential/Platelet, COMPLETE METABOLIC PANEL WITH GFR, Hemoglobin A1c, Urinalysis, Routine w reflex microscopic      Follow-up plan: Return in about 3 months (around 04/17/2018) for follow up after surgery - sooner if needed .   ############################################ ############################################ ############################################ ############################################    Outpatient Encounter Medications as of 01/15/2018  Medication Sig  . ACETAMINOPHEN 8 HOUR PO Take by mouth.  . Azelastine-Fluticasone 137-50 MCG/ACT SUSP One  spray each nostril BID  . diclofenac sodium (VOLTAREN) 1 % GEL Apply 2-4 g topically 4 (four) times daily as needed (pain). To affected joint/back.  . meloxicam (MOBIC) 7.5 MG tablet Take 1 tablet (7.5 mg total) by mouth daily as needed for pain.  . metoprolol succinate (TOPROL-XL) 50 MG 24 hr tablet Take 3 tablets (150 mg total) by mouth daily. Take with or immediately following a meal. STOP 200 mg dose for now  . omeprazole (PRILOSEC) 40 MG capsule TAKE 1 CAPSULE EVERY DAY  . simvastatin (ZOCOR) 10 MG tablet TAKE 1 TABLET AT BEDTIME  . valsartan-hydrochlorothiazide (DIOVAN-HCT) 320-25 MG tablet Take 1 tablet by mouth daily.   No facility-administered encounter medications on file as of 01/15/2018.    No Known Allergies    Review of Systems:  Constitutional: No recent illness  HEENT: No  headache, no vision change  Cardiac: No  chest pain, No  pressure, No palpitations  Respiratory:  No  shortness of breath. No  Cough  Neurologic: No  weakness, No  Dizziness   Exam:  Exam:  BP (!) 145/72 (BP Location: Left Arm, Patient Position: Sitting, Cuff Size: Normal)   Pulse (!) 52   Temp 97.9 F (36.6 C) (Oral)   Wt 165 lb 8 oz (75.1 kg)   BMI 29.32 kg/m   Constitutional: VS see above. General Appearance: alert, well-developed, well-nourished, NAD  Eyes: Normal lids and conjunctive, non-icteric sclera  Ears, Nose, Mouth, Throat: MMM, Normal external inspection ears/nares/mouth/lips/gums.   Neck: No masses, trachea midline. No thyroid enlargement. No tenderness/mass appreciated. No lymphadenopathy  Respiratory: Normal respiratory effort. no wheeze, no rhonchi, no rales  Cardiovascular: S1/S2 normal, no murmur, no rub/gallop auscultated. RRR. No lower extremity edema.   Gastrointestinal:  Nontender, no masses. No hepatomegaly, no splenomegaly.  Musculoskeletal: Gait normal. No clubbing/cyanosis of digits.   Neurological: Normal balance/coordination. No tremor. No cranial nerve  deficit on limited exam. Motor and sensation intact and symmetric. Cerebellar reflexes intact.   Skin: warm, dry, intact. No rash/ulcer. No concerning nevi or subq nodules on limited exam.  Psychiatric: Normal judgment/insight. Normal mood and affect. Oriented x3.     Visit summary with medication list and pertinent instructions was printed for patient to review, advised to alert [place] if any changes needed. All questions at time of visit were answered - patient instructed to contact office with any additional concerns. ER/RTC precautions were reviewed with the patient and understanding verbalized.   Follow-up plan: Return in about 3 months (around 04/17/2018) for follow up after surgery - sooner if needed .  Note: Total time spent 25 minutes, greater than 50% of the visit was spent face-to-face counseling and coordinating care for the following: The primary encounter diagnosis was Essential hypertension, benign. A diagnosis of Surgery, elective was also pertinent to this visit.Marland Kitchen  Please note: voice recognition software was used to produce this document, and typos may escape review. Please contact Dr. Lyn Hollingshead for any needed clarifications.

## 2018-01-16 ENCOUNTER — Encounter: Payer: Self-pay | Admitting: Osteopathic Medicine

## 2018-01-16 LAB — COMPLETE METABOLIC PANEL WITH GFR
AG RATIO: 1.6 (calc) (ref 1.0–2.5)
ALBUMIN MSPROF: 4.5 g/dL (ref 3.6–5.1)
ALKALINE PHOSPHATASE (APISO): 92 U/L (ref 40–115)
ALT: 12 U/L (ref 9–46)
AST: 23 U/L (ref 10–35)
BILIRUBIN TOTAL: 0.5 mg/dL (ref 0.2–1.2)
BUN: 13 mg/dL (ref 7–25)
CHLORIDE: 95 mmol/L — AB (ref 98–110)
CO2: 30 mmol/L (ref 20–32)
Calcium: 9.1 mg/dL (ref 8.6–10.3)
Creat: 1.08 mg/dL (ref 0.70–1.18)
GFR, EST AFRICAN AMERICAN: 79 mL/min/{1.73_m2} (ref >=60)
GFR, Est Non African American: 68 mL/min/{1.73_m2} (ref >=60)
GLOBULIN: 2.8 g/dL (ref 1.9–3.7)
Glucose, Bld: 91 mg/dL (ref 65–99)
Potassium: 4 mmol/L (ref 3.5–5.3)
SODIUM: 131 mmol/L — AB (ref 135–146)
TOTAL PROTEIN: 7.3 g/dL (ref 6.1–8.1)

## 2018-01-16 LAB — CBC WITH DIFFERENTIAL/PLATELET
BASOS ABS: 30 {cells}/uL (ref 0–200)
Basophils Relative: 0.5 %
EOS PCT: 5 %
Eosinophils Absolute: 295 cells/uL (ref 15–500)
HCT: 42.6 % (ref 38.5–50.0)
Hemoglobin: 14.2 g/dL (ref 13.2–17.1)
Lymphs Abs: 2372 cells/uL (ref 850–3900)
MCH: 30.1 pg (ref 27.0–33.0)
MCHC: 33.3 g/dL (ref 32.0–36.0)
MCV: 90.4 fL (ref 80.0–100.0)
MPV: 9 fL (ref 7.5–12.5)
Monocytes Relative: 10.8 %
NEUTROS PCT: 43.5 %
Neutro Abs: 2567 cells/uL (ref 1500–7800)
PLATELETS: 172 10*3/uL (ref 140–400)
RBC: 4.71 10*6/uL (ref 4.20–5.80)
RDW: 13.1 % (ref 11.0–15.0)
TOTAL LYMPHOCYTE: 40.2 %
WBC mixed population: 637 cells/uL (ref 200–950)
WBC: 5.9 10*3/uL (ref 3.8–10.8)

## 2018-01-16 LAB — URINALYSIS, ROUTINE W REFLEX MICROSCOPIC
BILIRUBIN URINE: NEGATIVE
Glucose, UA: NEGATIVE
Hgb urine dipstick: NEGATIVE
Ketones, ur: NEGATIVE
Leukocytes, UA: NEGATIVE
Nitrite: NEGATIVE
PH: 6.5 (ref 5.0–8.0)
Protein, ur: NEGATIVE
SPECIFIC GRAVITY, URINE: 1.004 (ref 1.001–1.03)

## 2018-01-16 LAB — HEMOGLOBIN A1C
HEMOGLOBIN A1C: 6.7 %{Hb} — AB (ref <5.7)
Mean Plasma Glucose: 146 (calc)
eAG (mmol/L): 8.1 (calc)

## 2018-01-17 NOTE — Addendum Note (Signed)
Addended by: Deirdre PippinsALEXANDER, Gwendolyne Welford M on: 01/17/2018 01:50 PM   Modules accepted: Orders

## 2018-01-29 ENCOUNTER — Telehealth: Payer: Self-pay

## 2018-01-29 MED ORDER — METOPROLOL SUCCINATE ER 50 MG PO TB24: 150.0000 mg | ORAL_TABLET | Freq: Every day | ORAL | 0 refills | Status: DC

## 2018-01-29 NOTE — Telephone Encounter (Signed)
As per [facility] - doctor must re-submit Metoprolol rx. Doctor sent #180 for 90 days and it's needing to be #180 for 60 days. As per pt's wife, pt is currently taking multiple 50 mg tabs because 150 mg is not available. [employer] is requesting clarification. Please advise, thanks.

## 2018-01-29 NOTE — Telephone Encounter (Signed)
My mistake, I meant to send in a 90-day supply with 270 tablets, he is taking 3/day.  [facility], hopefully this will be sufficient?  Any problems let me know, or covering provider can address it while I am out of the office.

## 2018-01-30 NOTE — Telephone Encounter (Signed)
Pt's wife advised of status update.

## 2018-01-31 DIAGNOSIS — E871 Hypo-osmolality and hyponatremia: Secondary | ICD-10-CM | POA: Diagnosis not present

## 2018-02-02 LAB — OSMOLALITY: Osmolality: 288 mOsm/kg (ref 278–305)

## 2018-02-02 LAB — BASIC METABOLIC PANEL WITH GFR
BUN/Creatinine Ratio: 10 (calc) (ref 6–22)
BUN: 12 mg/dL (ref 7–25)
CALCIUM: 9.5 mg/dL (ref 8.6–10.3)
CHLORIDE: 103 mmol/L (ref 98–110)
CO2: 26 mmol/L (ref 20–32)
Creat: 1.24 mg/dL — ABNORMAL HIGH (ref 0.70–1.18)
GFR, EST AFRICAN AMERICAN: 67 mL/min/{1.73_m2} (ref >=60)
GFR, EST NON AFRICAN AMERICAN: 58 mL/min/{1.73_m2} — AB (ref >=60)
Glucose, Bld: 85 mg/dL (ref 65–99)
POTASSIUM: 4.1 mmol/L (ref 3.5–5.3)
Sodium: 139 mmol/L (ref 135–146)

## 2018-02-02 LAB — OSMOLALITY, URINE: Osmolality, Ur: 250 mOsm/kg (ref 50–1200)

## 2018-02-02 LAB — TSH: TSH: 1.14 m[IU]/L (ref 0.40–4.50)

## 2018-02-02 LAB — SODIUM, URINE, RANDOM: Sodium, Ur: 12 mmol/L — ABNORMAL LOW (ref 28–272)

## 2018-02-05 ENCOUNTER — Other Ambulatory Visit: Payer: Self-pay | Admitting: Osteopathic Medicine

## 2018-02-24 ENCOUNTER — Other Ambulatory Visit: Payer: Self-pay | Admitting: Osteopathic Medicine

## 2018-02-24 DIAGNOSIS — G8929 Other chronic pain: Secondary | ICD-10-CM

## 2018-02-24 DIAGNOSIS — E785 Hyperlipidemia, unspecified: Secondary | ICD-10-CM

## 2018-02-24 DIAGNOSIS — M549 Dorsalgia, unspecified: Secondary | ICD-10-CM

## 2018-02-25 NOTE — H&P (Signed)
TOTAL KNEE ADMISSION H&P  Patient is being admitted for right total knee arthroplasty.  Subjective:  Chief Complaint:right knee pain.  HPI: Zachary Lyons, 72 y.o. male, has a history of pain and functional disability in the right knee due to arthritis and has failed non-surgical conservative treatments for greater than 12 weeks to includecorticosteriod injections, viscosupplementation injections and activity modification.  Onset of symptoms was gradual, starting 5 years ago with gradually worsening course since that time. The patient noted no past surgery on the right knee(s).  Patient currently rates pain in the right knee(s) at 5 out of 10 with activity. Patient has pain that interferes with activities of daily living and instability.  Patient has evidence of bone-on-bone arthritic changes in the medial and patellofemoral compartments by imaging studies. There is no active infection.  Patient Active Problem List   Diagnosis Date Noted  . Nasal polyp 07/02/2017  . Thrombocytopenia (HCC) 04/24/2016  . Elevated LFTs 02/17/2015  . Cerumen impaction 04/23/2014  . Prediabetes 04/20/2014  . Essential hypertension, benign 04/08/2014  . Hyperlipidemia 04/08/2014  . Habitual alcohol use 04/08/2014  . OA (osteoarthritis) of knee 04/27/2013   Past Medical History:  Diagnosis Date  . Arthritis    osteoarthritis  . GERD (gastroesophageal reflux disease)   . Hypertension   . Peptic ulcer    hx. of  . Transfusion history 04-22-13   15 yrs ago-"bleeding ulcer"    Past Surgical History:  Procedure Laterality Date  . CATARACT EXTRACTION Left 04-22-13  . COLONOSCOPY    . NASAL SINUS SURGERY    . STOMACH SURGERY     oversew of bleeding ulcer  . TOTAL KNEE ARTHROPLASTY Left 04/27/2013   Procedure: LEFT TOTAL KNEE ARTHROPLASTY;  Surgeon: Loanne DrillingFrank V Aluisio, MD;  Location: [facility];  Service: Orthopedics;  Laterality: Left;    No current facility-administered medications for this encounter.     Current Outpatient Medications  Medication Sig Dispense Refill Last Dose  . ACETAMINOPHEN 8 HOUR PO Take by mouth.   Taking  . Azelastine-Fluticasone 137-50 MCG/ACT SUSP One spray each nostril BID 1 Bottle 11 Taking  . diclofenac sodium (VOLTAREN) 1 % GEL Apply 2-4 g topically 4 (four) times daily as needed (pain). To affected joint/back. 100 g 11 Taking  . meloxicam (MOBIC) 7.5 MG tablet Take 1 tablet (7.5 mg total) by mouth daily as needed for pain. 90 tablet 1 Taking  . metoprolol succinate (TOPROL-XL) 50 MG 24 hr tablet Take 3 tablets (150 mg total) by mouth daily. Take with or immediately following a meal. STOP 200 mg dose for now 270 tablet 0   . omeprazole (PRILOSEC) 40 MG capsule TAKE 1 CAPSULE EVERY DAY 90 capsule 1 Taking  . simvastatin (ZOCOR) 10 MG tablet TAKE 1 TABLET AT BEDTIME 90 tablet 1 Taking  . valsartan-hydrochlorothiazide (DIOVAN-HCT) 320-25 MG tablet TAKE 1 TABLET EVERY DAY 90 tablet 1    No Known Allergies  Social History   Tobacco Use  . Smoking status: Former Smoker    Last attempt to quit: 04/23/1979    Years since quitting: 38.8  . Smokeless tobacco: Never Used  Substance Use Topics  . Alcohol use: Yes    Alcohol/week: 6.0 standard drinks    Types: 6 Cans of beer per week    No family history on file.   Review of Systems  Constitutional: Negative for chills and fever.  HENT: Negative for congestion, sore throat and tinnitus.   Eyes: Negative for double vision, photophobia  and pain.  Respiratory: Negative for cough, shortness of breath and wheezing.   Cardiovascular: Negative for chest pain, palpitations and orthopnea.  Gastrointestinal: Negative for heartburn, nausea and vomiting.  Genitourinary: Negative for dysuria, frequency and urgency.  Musculoskeletal: Positive for joint pain.  Neurological: Negative for dizziness, weakness and headaches.  Psychiatric/Behavioral: Negative for depression.    Objective:  Physical Exam  Well nourished and  well developed. General: Alert and oriented x3, cooperative and pleasant, no acute distress. Head: normocephalic, atraumatic, neck supple. Eyes: EOMI. Respiratory: breath sounds clear in all fields, no wheezing, rales, or rhonchi. Cardiovascular: Regular rate and rhythm, no murmurs, gallops or rubs.  Abdomen: non-tender to palpation and soft, normoactive bowel sounds. Musculoskeletal: Antalgic gait favoring the right, without using assisted devices.  Right Knee Exam: No effusion. Varus deformity. Range of motion is 0-30 degrees. Marked crepitus on range of motion of the knee. No medial or lateral joint line tenderness. Stable knee. Left Knee Exam: No swelling.Range of motion is 0-125 degrees.No crepitus on range of motion of the knee. No medial or lateral joint line tenderness. Stable knee. Calves soft and nontender. Motor function intact in LE. Strength 5/5 LE bilaterally. Neuro: Distal pulses 2+. Sensation to light touch intact in LE.  Vital signs in last 24 hours: Blood pressure: 142/80 mmHg Pulse: 60 bpm  Labs:   Estimated body mass index is 29.32 kg/m as calculated from the following:   Height as of 10/08/16: 5\' 3"  (1.6 m).   Weight as of 01/15/18: 75.1 kg.   Imaging Review Plain radiographs demonstrate severe degenerative joint disease of the right knee(s). The overall alignment isneutral. The bone quality appears to be adequate for age and reported activity level.   Preoperative templating of the joint replacement has been completed, documented, and submitted to the Operating Room personnel in order to optimize intra-operative equipment management.   Anticipated LOS equal to or greater than 2 midnights due to - Age 72 and older with one or more of the following:  - Obesity  - Expected need for hospital services (PT, OT, Nursing) required for safe  discharge  - Anticipated need for postoperative skilled nursing care or inpatient rehab  - Active co-morbidities: None OR   -  Unanticipated findings during/Post Surgery: None  - Patient is a high risk of re-admission due to: None     Assessment/Plan:  End stage arthritis, right knee   The patient history, physical examination, clinical judgment of the provider and imaging studies are consistent with end stage degenerative joint disease of the right knee(s) and total knee arthroplasty is deemed medically necessary. The treatment options including medical management, injection therapy arthroscopy and arthroplasty were discussed at length. The risks and benefits of total knee arthroplasty were presented and reviewed. The risks due to aseptic loosening, infection, stiffness, patella tracking problems, thromboembolic complications and other imponderables were discussed. The patient acknowledged the explanation, agreed to proceed with the plan and consent was signed. Patient is being admitted for inpatient treatment for surgery, pain control, PT, OT, prophylactic antibiotics, VTE prophylaxis, progressive ambulation and ADL's and discharge planning. The patient is planning to be discharged home with outpatient physical therapy.   Therapy Plans: outpatient therapy in [place] Disposition: Home with wife Planned DVT Prophylaxis: aspirin 325 mg BID DME needed: None PCP: Sunnie NielsenNatalie Alexander, DO (medical clearance provided) TXA: IV Allergies: None  - Patient was instructed on what medications to stop prior to surgery. - Follow-up visit in 2 weeks with Dr. Lequita HaltAluisio -  Begin physical therapy following surgery - Pre-operative lab work as pre-surgical testing - Prescriptions will be provided in hospital at time of discharge  Theresa Duty, PA-C Orthopedic Surgery [facility]

## 2018-03-13 NOTE — Progress Notes (Signed)
EKG 01-07-18 epic  cxr 07-25-17 epic

## 2018-03-13 NOTE — Patient Instructions (Signed)
Zachary Lyons  03/13/2018   Your procedure is scheduled on: 03-19-18   [facility]  Report to admitting at       0550 AM    Call this number if you have problems the morning of surgery [phone]   Remember: Do not eat food or drink liquids :After Midnight.     Take these medicines the morning of surgery with A SIP OF WATER: omeprazole , metoprolol, nasal spray if needed                                You may not have any metal on your body including hair pins and              piercings  Do not wear jewelry,  lotions, powders or perfumes, deodorant                       Men may shave face and neck.   Do not bring valuables to the hospital. [facility] IS NOT             RESPONSIBLE   FOR VALUABLES.  Contacts, dentures or bridgework may not be worn into surgery.  Leave suitcase in the car. After surgery it may be brought to your room.                Please read over the following fact sheets you were given: _____________________________________________________________________          [facility] Pre[facility] Before surgery, you can play an important role.  Because skin is not sterile, your skin needs to be as free of germs as possible.  You can reduce the number of germs on your skin by washing with CHG (chlorahexidine gluconate) soap before surgery.  CHG is an antiseptic cleaner which kills germs and bonds with the skin to continue killing germs even after washing. Please DO NOT use if you have an allergy to CHG or antibacterial soaps.  If your skin becomes reddened/irritated stop using the CHG and inform your nurse when you arrive at Short Stay. Do not shave (including legs and underarms) for at least 48 hours prior to the first CHG shower.  You may shave your face/neck. Please follow these instructions carefully:  1.  Shower with CHG Soap the night before surgery and the  morning of Surgery.  2.  If you choose  to wash your hair, wash your hair first as usual with your  normal  shampoo.  3.  After you shampoo, rinse your hair and body thoroughly to remove the  shampoo.                           4.  Use CHG as you would any other liquid soap.  You can apply chg directly  to the skin and wash                       Gently with a scrungie or clean washcloth.  5.  Apply the CHG Soap to your body ONLY FROM THE NECK DOWN.   Do not use on face/ open  Wound or open sores. Avoid contact with eyes, ears mouth and genitals (private parts).                       Wash face,  Genitals (private parts) with your normal soap.             6.  Wash thoroughly, paying special attention to the area where your surgery  will be performed.  7.  Thoroughly rinse your body with warm water from the neck down.  8.  DO NOT shower/wash with your normal soap after using and rinsing off  the CHG Soap.                9.  Pat yourself dry with a clean towel.            10.  Wear clean pajamas.            11.  Place clean sheets on your bed the night of your first shower and do not  sleep with pets. Day of Surgery : Do not apply any lotions/deodorants the morning of surgery.  Please wear clean clothes to the hospital/surgery center.  FAILURE TO FOLLOW THESE INSTRUCTIONS MAY RESULT IN THE CANCELLATION OF YOUR SURGERY PATIENT SIGNATURE_________________________________  NURSE SIGNATURE__________________________________  ________________________________________________________________________  WHAT IS A BLOOD TRANSFUSION? Blood Transfusion Information  A transfusion is the replacement of blood or some of its parts. Blood is made up of multiple cells which provide different functions.  Red blood cells carry oxygen and are used for blood loss replacement.  White blood cells fight against infection.  Platelets control bleeding.  Plasma helps clot blood.  Other blood products are available for specialized  needs, such as hemophilia or other clotting disorders. BEFORE THE TRANSFUSION  Who gives blood for transfusions?   Healthy volunteers who are fully evaluated to make sure their blood is safe. This is blood bank blood. Transfusion therapy is the safest it has ever been in the practice of medicine. Before blood is taken from a donor, a complete history is taken to make sure that person has no history of diseases nor engages in risky social behavior (examples are intravenous drug use or sexual activity with multiple partners). The donor's travel history is screened to minimize risk of transmitting infections, such as malaria. The donated blood is tested for signs of infectious diseases, such as HIV and hepatitis. The blood is then tested to be sure it is compatible with you in order to minimize the chance of a transfusion reaction. If you or a relative donates blood, this is often done in anticipation of surgery and is not appropriate for emergency situations. It takes many days to process the donated blood. RISKS AND COMPLICATIONS Although transfusion therapy is very safe and saves many lives, the main dangers of transfusion include:   Getting an infectious disease.  Developing a transfusion reaction. This is an allergic reaction to something in the blood you were given. Every precaution is taken to prevent this. The decision to have a blood transfusion has been considered carefully by your caregiver before blood is given. Blood is not given unless the benefits outweigh the risks. AFTER THE TRANSFUSION  Right after receiving a blood transfusion, you will usually feel much better and more energetic. This is especially true if your red blood cells have gotten low (anemic). The transfusion raises the level of the red blood cells which carry oxygen, and this usually causes an energy increase.  The  nurse administering the transfusion will monitor you carefully for complications. HOME CARE INSTRUCTIONS   No special instructions are needed after a transfusion. You may find your energy is better. Speak with your caregiver about any limitations on activity for underlying diseases you may have. SEEK MEDICAL CARE IF:   Your condition is not improving after your transfusion.  You develop redness or irritation at the intravenous (IV) site. SEEK IMMEDIATE MEDICAL CARE IF:  Any of the following symptoms occur over the next 12 hours:  Shaking chills.  You have a temperature by mouth above 102 F (38.9 C), not controlled by medicine.  Chest, back, or muscle pain.  People around you feel you are not acting correctly or are confused.  Shortness of breath or difficulty breathing.  Dizziness and fainting.  You get a rash or develop hives.  You have a decrease in urine output.  Your urine turns a dark color or changes to pink, red, or brown. Any of the following symptoms occur over the next 10 days:  You have a temperature by mouth above 102 F (38.9 C), not controlled by medicine.  Shortness of breath.  Weakness after normal activity.  The white part of the eye turns yellow (jaundice).  You have a decrease in the amount of urine or are urinating less often.  Your urine turns a dark color or changes to pink, red, or brown. Document Released: 06/29/2000 Document Revised: 09/24/2011 Document Reviewed: 02/16/2008 Exit[employer] Patient Information 2014 [place].  _______________________________________________________________________  Incentive Spirometer  An incentive spirometer is a tool that can help keep your lungs clear and active. This tool measures how well you are filling your lungs with each breath. Taking long deep breaths may help reverse or decrease the chance of developing breathing (pulmonary) problems (especially infection) following:  A long period of time when you are unable to move or be active. BEFORE THE PROCEDURE   If the spirometer includes an indicator to  show your best effort, your nurse or respiratory therapist will set it to a desired goal.  If possible, sit up straight or lean slightly forward. Try not to slouch.  Hold the incentive spirometer in an upright position. INSTRUCTIONS FOR USE  1. Sit on the edge of your bed if possible, or sit up as far as you can in bed or on a chair. 2. Hold the incentive spirometer in an upright position. 3. Breathe out normally. 4. Place the mouthpiece in your mouth and seal your lips tightly around it. 5. Breathe in slowly and as deeply as possible, raising the piston or the ball toward the top of the column. 6. Hold your breath for 3-5 seconds or for as long as possible. Allow the piston or ball to fall to the bottom of the column. 7. Remove the mouthpiece from your mouth and breathe out normally. 8. Rest for a few seconds and repeat Steps 1 through 7 at least 10 times every 1-2 hours when you are awake. Take your time and take a few normal breaths between deep breaths. 9. The spirometer may include an indicator to show your best effort. Use the indicator as a goal to work toward during each repetition. 10. After each set of 10 deep breaths, practice coughing to be sure your lungs are clear. If you have an incision (the cut made at the time of surgery), support your incision when coughing by placing a pillow or rolled up towels firmly against it. Once you are able to get  out of bed, walk around indoors and cough well. You may stop using the incentive spirometer when instructed by your caregiver.  RISKS AND COMPLICATIONS  Take your time so you do not get dizzy or light-headed.  If you are in pain, you may need to take or ask for pain medication before doing incentive spirometry. It is harder to take a deep breath if you are having pain. AFTER USE  Rest and breathe slowly and easily.  It can be helpful to keep track of a log of your progress. Your caregiver can provide you with a simple table to help with  this. If you are using the spirometer at home, follow these instructions: [place] IF:   You are having difficultly using the spirometer.  You have trouble using the spirometer as often as instructed.  Your pain medication is not giving enough relief while using the spirometer.  You develop fever of 100.5 F (38.1 C) or higher. SEEK IMMEDIATE MEDICAL CARE IF:   You cough up bloody sputum that had not been present before.  You develop fever of 102 F (38.9 C) or greater.  You develop worsening pain at or near the incision site. MAKE SURE YOU:   Understand these instructions.  Will watch your condition.  Will get help right away if you are not doing well or get worse. Document Released: 11/12/2006 Document Revised: 09/24/2011 Document Reviewed: 01/13/2007 [facility] Patient Information 2014 [place], [place].   ________________________________________________________________________

## 2018-03-19 ENCOUNTER — Encounter (HOSPITAL_COMMUNITY): Payer: Self-pay

## 2018-03-19 ENCOUNTER — Other Ambulatory Visit: Payer: Self-pay

## 2018-03-19 ENCOUNTER — Encounter (HOSPITAL_COMMUNITY)
Admission: RE | Admit: 2018-03-19 | Discharge: 2018-03-19 | Disposition: A | Discharge status: Home / Self Care | Payer: Medicare HMO | Source: Ambulatory Visit | Attending: Orthopedic Surgery | Admitting: Orthopedic Surgery

## 2018-03-19 DIAGNOSIS — Z01818 Encounter for other preprocedural examination: Secondary | ICD-10-CM | POA: Insufficient documentation

## 2018-03-19 DIAGNOSIS — M1711 Unilateral primary osteoarthritis, right knee: Secondary | ICD-10-CM | POA: Insufficient documentation

## 2018-03-19 DIAGNOSIS — Z87891 Personal history of nicotine dependence: Secondary | ICD-10-CM | POA: Diagnosis not present

## 2018-03-19 DIAGNOSIS — R7303 Prediabetes: Secondary | ICD-10-CM | POA: Insufficient documentation

## 2018-03-19 DIAGNOSIS — I10 Essential (primary) hypertension: Secondary | ICD-10-CM | POA: Insufficient documentation

## 2018-03-19 DIAGNOSIS — E785 Hyperlipidemia, unspecified: Secondary | ICD-10-CM | POA: Diagnosis not present

## 2018-03-19 DIAGNOSIS — Z79899 Other long term (current) drug therapy: Secondary | ICD-10-CM | POA: Insufficient documentation

## 2018-03-19 DIAGNOSIS — Z96652 Presence of left artificial knee joint: Secondary | ICD-10-CM | POA: Insufficient documentation

## 2018-03-19 DIAGNOSIS — Z791 Long term (current) use of non-steroidal anti-inflammatories (NSAID): Secondary | ICD-10-CM | POA: Diagnosis not present

## 2018-03-19 DIAGNOSIS — K219 Gastro-esophageal reflux disease without esophagitis: Secondary | ICD-10-CM | POA: Diagnosis not present

## 2018-03-19 HISTORY — DX: Nasal polyp, unspecified: J33.9

## 2018-03-19 HISTORY — DX: Prediabetes: R73.03

## 2018-03-19 LAB — CBC
HCT: 43.2 % (ref 39.0–52.0)
Hemoglobin: 14.5 g/dL (ref 13.0–17.0)
MCH: 30.6 pg (ref 26.0–34.0)
MCHC: 33.6 g/dL (ref 30.0–36.0)
MCV: 91.1 fL (ref 78.0–100.0)
Platelets: 175 10*3/uL (ref 150–400)
RBC: 4.74 MIL/uL (ref 4.22–5.81)
RDW: 14.4 % (ref 11.5–15.5)
WBC: 6.9 10*3/uL (ref 4.0–10.5)

## 2018-03-19 LAB — COMPREHENSIVE METABOLIC PANEL
ALBUMIN: 4.1 g/dL (ref 3.5–5.0)
ALT: 19 U/L (ref 0–44)
ANION GAP: 10 (ref 5–15)
AST: 26 U/L (ref 15–41)
Alkaline Phosphatase: 108 U/L (ref 38–126)
BUN: 18 mg/dL (ref 8–23)
CO2: 28 mmol/L (ref 22–32)
Calcium: 9.3 mg/dL (ref 8.9–10.3)
Chloride: 103 mmol/L (ref 98–111)
Creatinine, Ser: 1.22 mg/dL (ref 0.61–1.24)
GFR calc Af Amer: >60 mL/min (ref >60)
GFR calc non Af Amer: 57 mL/min — ABNORMAL LOW (ref >60)
GLUCOSE: 97 mg/dL (ref 70–99)
POTASSIUM: 3.5 mmol/L (ref 3.5–5.1)
SODIUM: 141 mmol/L (ref 135–145)
TOTAL PROTEIN: 7.6 g/dL (ref 6.5–8.1)
Total Bilirubin: 0.8 mg/dL (ref 0.3–1.2)

## 2018-03-19 LAB — APTT: aPTT: 29 seconds (ref 24–36)

## 2018-03-19 LAB — HEMOGLOBIN A1C
Hgb A1c MFr Bld: 6.6 % — ABNORMAL HIGH (ref 4.8–5.6)
Mean Plasma Glucose: 142.72 mg/dL

## 2018-03-19 LAB — SURGICAL PCR SCREEN
MRSA, PCR: NEGATIVE
Staphylococcus aureus: NEGATIVE

## 2018-03-19 LAB — PROTIME-INR
INR: 0.91
Prothrombin Time: 12.2 seconds (ref 11.4–15.2)

## 2018-03-23 MED ORDER — BUPIVACAINE LIPOSOME 1.3 % IJ SUSP
20.0000 mL | Freq: Once | INTRAMUSCULAR | Status: DC
  Filled 2018-03-23: qty 20

## 2018-03-24 ENCOUNTER — Inpatient Hospital Stay (HOSPITAL_COMMUNITY)
Admission: RE | Admit: 2018-03-24 | Discharge: 2018-03-26 | DRG: 470 | Disposition: A | Discharge status: Home / Self Care | Payer: Medicare HMO | Source: Ambulatory Visit | Attending: Orthopedic Surgery | Admitting: Orthopedic Surgery

## 2018-03-24 ENCOUNTER — Inpatient Hospital Stay (HOSPITAL_COMMUNITY): Payer: Medicare HMO | Admitting: Certified Registered Nurse Anesthetist

## 2018-03-24 ENCOUNTER — Encounter (HOSPITAL_COMMUNITY)
Admission: RE | Disposition: A | Discharge status: Home / Self Care | Payer: Self-pay | Source: Ambulatory Visit | Attending: Orthopedic Surgery

## 2018-03-24 ENCOUNTER — Encounter (HOSPITAL_COMMUNITY): Payer: Self-pay | Admitting: Certified Registered Nurse Anesthetist

## 2018-03-24 ENCOUNTER — Other Ambulatory Visit: Payer: Self-pay

## 2018-03-24 DIAGNOSIS — M1711 Unilateral primary osteoarthritis, right knee: Principal | ICD-10-CM | POA: Diagnosis present

## 2018-03-24 DIAGNOSIS — K219 Gastro-esophageal reflux disease without esophagitis: Secondary | ICD-10-CM | POA: Diagnosis present

## 2018-03-24 DIAGNOSIS — M179 Osteoarthritis of knee, unspecified: Secondary | ICD-10-CM | POA: Diagnosis present

## 2018-03-24 DIAGNOSIS — Z87891 Personal history of nicotine dependence: Secondary | ICD-10-CM | POA: Diagnosis not present

## 2018-03-24 DIAGNOSIS — Z791 Long term (current) use of non-steroidal anti-inflammatories (NSAID): Secondary | ICD-10-CM

## 2018-03-24 DIAGNOSIS — Z79899 Other long term (current) drug therapy: Secondary | ICD-10-CM | POA: Diagnosis not present

## 2018-03-24 DIAGNOSIS — I10 Essential (primary) hypertension: Secondary | ICD-10-CM | POA: Diagnosis present

## 2018-03-24 DIAGNOSIS — R7303 Prediabetes: Secondary | ICD-10-CM | POA: Diagnosis present

## 2018-03-24 DIAGNOSIS — Z96653 Presence of artificial knee joint, bilateral: Secondary | ICD-10-CM | POA: Diagnosis not present

## 2018-03-24 DIAGNOSIS — E785 Hyperlipidemia, unspecified: Secondary | ICD-10-CM | POA: Diagnosis present

## 2018-03-24 DIAGNOSIS — G8918 Other acute postprocedural pain: Secondary | ICD-10-CM | POA: Diagnosis not present

## 2018-03-24 DIAGNOSIS — M1710 Unilateral primary osteoarthritis, unspecified knee: Secondary | ICD-10-CM | POA: Diagnosis present

## 2018-03-24 DIAGNOSIS — R269 Unspecified abnormalities of gait and mobility: Secondary | ICD-10-CM | POA: Diagnosis not present

## 2018-03-24 HISTORY — PX: TOTAL KNEE ARTHROPLASTY: SHX125

## 2018-03-24 LAB — TYPE AND SCREEN
ABO/RH(D): O POS
ANTIBODY SCREEN: NEGATIVE

## 2018-03-24 SURGERY — ARTHROPLASTY, KNEE, TOTAL
Anesthesia: Spinal | Site: Knee | Laterality: Right

## 2018-03-24 MED ORDER — PROPOFOL 10 MG/ML IV BOLUS
INTRAVENOUS | Status: AC
  Filled 2018-03-24: qty 60

## 2018-03-24 MED ORDER — PHENOL 1.4 % MT LIQD: 1.0000 | OROMUCOSAL | Status: DC | PRN

## 2018-03-24 MED ORDER — BUPIVACAINE HCL (PF) 0.75 % IJ SOLN
INTRAMUSCULAR | Status: DC | PRN
  Administered 2018-03-24: 1.6 mL via INTRATHECAL

## 2018-03-24 MED ORDER — ONDANSETRON HCL 4 MG/2ML IJ SOLN
INTRAMUSCULAR | Status: DC | PRN
  Administered 2018-03-24: 4 mg via INTRAVENOUS

## 2018-03-24 MED ORDER — SODIUM CHLORIDE 0.9 % IV SOLN
INTRAVENOUS | Status: DC
  Administered 2018-03-24 – 2018-03-25 (×2): via INTRAVENOUS

## 2018-03-24 MED ORDER — DOCUSATE SODIUM 100 MG PO CAPS
100.0000 mg | ORAL_CAPSULE | Freq: Two times a day (BID) | ORAL | Status: DC
  Administered 2018-03-24 – 2018-03-26 (×4): 100 mg via ORAL
  Filled 2018-03-24 (×4): qty 1

## 2018-03-24 MED ORDER — STERILE WATER FOR IRRIGATION IR SOLN
Status: DC | PRN
  Administered 2018-03-24: 2000 mL

## 2018-03-24 MED ORDER — ACETAMINOPHEN 500 MG PO TABS
1000.0000 mg | ORAL_TABLET | Freq: Four times a day (QID) | ORAL | Status: AC
  Administered 2018-03-24 – 2018-03-25 (×3): 1000 mg via ORAL
  Filled 2018-03-24 (×4): qty 2

## 2018-03-24 MED ORDER — LIDOCAINE 2% (20 MG/ML) 5 ML SYRINGE
INTRAMUSCULAR | Status: AC
  Filled 2018-03-24: qty 5

## 2018-03-24 MED ORDER — BISACODYL 10 MG RE SUPP: 10.0000 mg | Freq: Every day | RECTAL | Status: DC | PRN

## 2018-03-24 MED ORDER — FENTANYL CITRATE (PF) 100 MCG/2ML IJ SOLN
INTRAMUSCULAR | Status: AC
  Administered 2018-03-24: 50 ug
  Filled 2018-03-24: qty 2

## 2018-03-24 MED ORDER — EPHEDRINE 5 MG/ML INJ
INTRAVENOUS | Status: AC
  Filled 2018-03-24: qty 10

## 2018-03-24 MED ORDER — CEFAZOLIN SODIUM-DEXTROSE 2-4 GM/100ML-% IV SOLN
2.0000 g | Freq: Four times a day (QID) | INTRAVENOUS | Status: AC
  Administered 2018-03-24 (×2): 2 g via INTRAVENOUS
  Filled 2018-03-24 (×2): qty 100

## 2018-03-24 MED ORDER — EPHEDRINE SULFATE-NACL 50-0.9 MG/10ML-% IV SOSY
PREFILLED_SYRINGE | INTRAVENOUS | Status: DC | PRN
  Administered 2018-03-24 (×2): 5 mg via INTRAVENOUS

## 2018-03-24 MED ORDER — TRANEXAMIC ACID 1000 MG/10ML IV SOLN
1000.0000 mg | Freq: Once | INTRAVENOUS | Status: AC
  Administered 2018-03-24: 1000 mg via INTRAVENOUS
  Filled 2018-03-24: qty 1000

## 2018-03-24 MED ORDER — SODIUM CHLORIDE 0.9 % IJ SOLN
INTRAMUSCULAR | Status: DC | PRN
  Administered 2018-03-24: 60 mL

## 2018-03-24 MED ORDER — GABAPENTIN 300 MG PO CAPS
300.0000 mg | ORAL_CAPSULE | Freq: Once | ORAL | Status: AC
  Administered 2018-03-24: 300 mg via ORAL
  Filled 2018-03-24: qty 1

## 2018-03-24 MED ORDER — ONDANSETRON HCL 4 MG/2ML IJ SOLN: 4.0000 mg | Freq: Four times a day (QID) | INTRAMUSCULAR | Status: DC | PRN

## 2018-03-24 MED ORDER — SODIUM CHLORIDE 0.9 % IJ SOLN
INTRAMUSCULAR | Status: AC
  Filled 2018-03-24: qty 10

## 2018-03-24 MED ORDER — POLYETHYLENE GLYCOL 3350 17 G PO PACK: 17.0000 g | PACK | Freq: Every day | ORAL | Status: DC | PRN

## 2018-03-24 MED ORDER — SODIUM CHLORIDE 0.9 % IR SOLN
Status: DC | PRN
  Administered 2018-03-24: 1000 mL

## 2018-03-24 MED ORDER — DEXAMETHASONE SODIUM PHOSPHATE 10 MG/ML IJ SOLN
8.0000 mg | Freq: Once | INTRAMUSCULAR | Status: AC
  Administered 2018-03-24: 10 mg via INTRAVENOUS

## 2018-03-24 MED ORDER — 0.9 % SODIUM CHLORIDE (POUR BTL) OPTIME
TOPICAL | Status: DC | PRN
  Administered 2018-03-24: 1000 mL

## 2018-03-24 MED ORDER — TRANEXAMIC ACID 1000 MG/10ML IV SOLN
1000.0000 mg | INTRAVENOUS | Status: AC
  Administered 2018-03-24: 1000 mg via INTRAVENOUS
  Filled 2018-03-24: qty 10

## 2018-03-24 MED ORDER — DEXAMETHASONE SODIUM PHOSPHATE 10 MG/ML IJ SOLN
10.0000 mg | Freq: Once | INTRAMUSCULAR | Status: DC
  Filled 2018-03-24: qty 1

## 2018-03-24 MED ORDER — PHENYLEPHRINE HCL-NACL 10-0.9 MG/250ML-% IV SOLN
INTRAVENOUS | Status: AC
  Filled 2018-03-24: qty 250

## 2018-03-24 MED ORDER — CEFAZOLIN SODIUM-DEXTROSE 2-4 GM/100ML-% IV SOLN
2.0000 g | INTRAVENOUS | Status: AC
  Administered 2018-03-24: 2 g via INTRAVENOUS
  Filled 2018-03-24: qty 100

## 2018-03-24 MED ORDER — METOCLOPRAMIDE HCL 5 MG PO TABS: 5.0000 mg | ORAL_TABLET | Freq: Three times a day (TID) | ORAL | Status: DC | PRN

## 2018-03-24 MED ORDER — PROMETHAZINE HCL 25 MG/ML IJ SOLN: 6.2500 mg | INTRAMUSCULAR | Status: DC | PRN

## 2018-03-24 MED ORDER — METOCLOPRAMIDE HCL 5 MG/ML IJ SOLN: 5.0000 mg | Freq: Three times a day (TID) | INTRAMUSCULAR | Status: DC | PRN

## 2018-03-24 MED ORDER — VALSARTAN-HYDROCHLOROTHIAZIDE 320-25 MG PO TABS: 1.0000 | ORAL_TABLET | Freq: Every day | ORAL | Status: DC

## 2018-03-24 MED ORDER — CHLORHEXIDINE GLUCONATE 4 % EX LIQD: 60.0000 mL | Freq: Once | CUTANEOUS | Status: DC

## 2018-03-24 MED ORDER — HYDROMORPHONE HCL 1 MG/ML IJ SOLN: 0.5000 mg | INTRAMUSCULAR | Status: DC | PRN

## 2018-03-24 MED ORDER — DEXAMETHASONE SODIUM PHOSPHATE 10 MG/ML IJ SOLN
INTRAMUSCULAR | Status: AC
  Filled 2018-03-24: qty 1

## 2018-03-24 MED ORDER — SIMVASTATIN 20 MG PO TABS
10.0000 mg | ORAL_TABLET | Freq: Every day | ORAL | Status: DC
  Administered 2018-03-24 – 2018-03-25 (×2): 10 mg via ORAL
  Filled 2018-03-24 (×2): qty 1

## 2018-03-24 MED ORDER — PANTOPRAZOLE SODIUM 40 MG PO TBEC
80.0000 mg | DELAYED_RELEASE_TABLET | Freq: Every day | ORAL | Status: DC
  Administered 2018-03-25 – 2018-03-26 (×2): 80 mg via ORAL
  Filled 2018-03-24 (×2): qty 2

## 2018-03-24 MED ORDER — FLEET ENEMA 7-19 GM/118ML RE ENEM: 1.0000 | ENEMA | Freq: Once | RECTAL | Status: DC | PRN

## 2018-03-24 MED ORDER — CLONIDINE HCL (ANALGESIA) 100 MCG/ML EP SOLN
EPIDURAL | Status: DC | PRN
  Administered 2018-03-24: 50 ug

## 2018-03-24 MED ORDER — SODIUM CHLORIDE 0.9 % IJ SOLN
INTRAMUSCULAR | Status: AC
  Filled 2018-03-24: qty 50

## 2018-03-24 MED ORDER — MENTHOL 3 MG MT LOZG: 1.0000 | LOZENGE | OROMUCOSAL | Status: DC | PRN

## 2018-03-24 MED ORDER — HYDROCHLOROTHIAZIDE 25 MG PO TABS
25.0000 mg | ORAL_TABLET | Freq: Every day | ORAL | Status: DC
  Administered 2018-03-25 – 2018-03-26 (×2): 25 mg via ORAL
  Filled 2018-03-24 (×2): qty 1

## 2018-03-24 MED ORDER — PROPOFOL 500 MG/50ML IV EMUL
INTRAVENOUS | Status: DC | PRN
  Administered 2018-03-24: 75 ug/kg/min via INTRAVENOUS

## 2018-03-24 MED ORDER — ONDANSETRON HCL 4 MG PO TABS: 4.0000 mg | ORAL_TABLET | Freq: Four times a day (QID) | ORAL | Status: DC | PRN

## 2018-03-24 MED ORDER — METOPROLOL SUCCINATE ER 50 MG PO TB24
150.0000 mg | ORAL_TABLET | Freq: Every day | ORAL | Status: DC
  Administered 2018-03-25 – 2018-03-26 (×2): 150 mg via ORAL
  Filled 2018-03-24 (×2): qty 3

## 2018-03-24 MED ORDER — ROPIVACAINE HCL 7.5 MG/ML IJ SOLN
INTRAMUSCULAR | Status: DC | PRN
  Administered 2018-03-24: 20 mL via PERINEURAL

## 2018-03-24 MED ORDER — DIPHENHYDRAMINE HCL 12.5 MG/5ML PO ELIX: 12.5000 mg | ORAL_SOLUTION | ORAL | Status: DC | PRN

## 2018-03-24 MED ORDER — METHOCARBAMOL 500 MG IVPB - SIMPLE MED
500.0000 mg | Freq: Four times a day (QID) | INTRAVENOUS | Status: DC | PRN
  Filled 2018-03-24: qty 50

## 2018-03-24 MED ORDER — BUPIVACAINE LIPOSOME 1.3 % IJ SUSP
INTRAMUSCULAR | Status: DC | PRN
  Administered 2018-03-24: 20 mL

## 2018-03-24 MED ORDER — HYDROMORPHONE HCL 1 MG/ML IJ SOLN: 0.2500 mg | INTRAMUSCULAR | Status: DC | PRN

## 2018-03-24 MED ORDER — LACTATED RINGERS IV SOLN
INTRAVENOUS | Status: DC
  Administered 2018-03-24 (×2): via INTRAVENOUS

## 2018-03-24 MED ORDER — ASPIRIN EC 325 MG PO TBEC
325.0000 mg | DELAYED_RELEASE_TABLET | Freq: Two times a day (BID) | ORAL | Status: DC
  Administered 2018-03-25 – 2018-03-26 (×3): 325 mg via ORAL
  Filled 2018-03-24 (×3): qty 1

## 2018-03-24 MED ORDER — OXYCODONE HCL 5 MG PO TABS: 10.0000 mg | ORAL_TABLET | ORAL | Status: DC | PRN

## 2018-03-24 MED ORDER — TRAMADOL HCL 50 MG PO TABS
50.0000 mg | ORAL_TABLET | Freq: Four times a day (QID) | ORAL | Status: DC | PRN
  Administered 2018-03-25: 100 mg via ORAL
  Filled 2018-03-24: qty 2

## 2018-03-24 MED ORDER — PROPOFOL 10 MG/ML IV BOLUS
INTRAVENOUS | Status: DC | PRN
  Administered 2018-03-24 (×2): 20 mg via INTRAVENOUS

## 2018-03-24 MED ORDER — OXYCODONE HCL 5 MG PO TABS
5.0000 mg | ORAL_TABLET | ORAL | Status: DC | PRN
  Administered 2018-03-24: 5 mg via ORAL
  Administered 2018-03-25: 10 mg via ORAL
  Administered 2018-03-25 – 2018-03-26 (×3): 5 mg via ORAL
  Filled 2018-03-24: qty 1
  Filled 2018-03-24: qty 2
  Filled 2018-03-24 (×3): qty 1
  Filled 2018-03-24: qty 2
  Filled 2018-03-24: qty 1

## 2018-03-24 MED ORDER — ATROPINE SULFATE 0.4 MG/ML IJ SOLN
INTRAMUSCULAR | Status: DC | PRN
  Administered 2018-03-24: 0.2 mg via INTRAVENOUS

## 2018-03-24 MED ORDER — IRBESARTAN 150 MG PO TABS
300.0000 mg | ORAL_TABLET | Freq: Every day | ORAL | Status: DC
  Administered 2018-03-25 – 2018-03-26 (×2): 300 mg via ORAL
  Filled 2018-03-24 (×2): qty 2

## 2018-03-24 MED ORDER — ONDANSETRON HCL 4 MG/2ML IJ SOLN
INTRAMUSCULAR | Status: AC
  Filled 2018-03-24: qty 2

## 2018-03-24 MED ORDER — ACETAMINOPHEN 10 MG/ML IV SOLN
1000.0000 mg | Freq: Four times a day (QID) | INTRAVENOUS | Status: DC
  Administered 2018-03-24: 1000 mg via INTRAVENOUS
  Filled 2018-03-24: qty 100

## 2018-03-24 MED ORDER — METHOCARBAMOL 500 MG PO TABS
500.0000 mg | ORAL_TABLET | Freq: Four times a day (QID) | ORAL | Status: DC | PRN
  Administered 2018-03-24 – 2018-03-26 (×4): 500 mg via ORAL
  Filled 2018-03-24 (×5): qty 1

## 2018-03-24 MED ORDER — MIDAZOLAM HCL 2 MG/2ML IJ SOLN
INTRAMUSCULAR | Status: AC
  Administered 2018-03-24: 1 mg
  Filled 2018-03-24: qty 2

## 2018-03-24 SURGICAL SUPPLY — 55 items
BAG ZIPLOCK 12X15 (MISCELLANEOUS) ×3 IMPLANT
BANDAGE ACE 6X5 VEL STRL LF (GAUZE/BANDAGES/DRESSINGS) ×3 IMPLANT
BLADE SAG 18X100X1.27 (BLADE) ×3 IMPLANT
BLADE SAW SGTL 11.0X1.19X90.0M (BLADE) ×3 IMPLANT
BOWL SMART MIX CTS (DISPOSABLE) ×3 IMPLANT
CEMENT HV SMART SET (Cement) ×6 IMPLANT
CEMENT TIBIA MBT (Knees) ×1 IMPLANT
CLOSURE WOUND 1/2 X4 (GAUZE/BANDAGES/DRESSINGS) ×2
COVER SURGICAL LIGHT HANDLE (MISCELLANEOUS) ×3 IMPLANT
CUFF TOURN SGL QUICK 34 (TOURNIQUET CUFF) ×2
CUFF TRNQT CYL 34X4X40X1 (TOURNIQUET CUFF) ×1 IMPLANT
DECANTER SPIKE VIAL GLASS SM (MISCELLANEOUS) ×3 IMPLANT
DRAPE U-SHAPE 47X51 STRL (DRAPES) ×3 IMPLANT
DRSG ADAPTIC 3X8 NADH LF (GAUZE/BANDAGES/DRESSINGS) ×3 IMPLANT
DRSG PAD ABDOMINAL 8X10 ST (GAUZE/BANDAGES/DRESSINGS) ×3 IMPLANT
DURAPREP 26ML APPLICATOR (WOUND CARE) ×3 IMPLANT
ELECT REM PT RETURN 15FT ADLT (MISCELLANEOUS) ×3 IMPLANT
EVACUATOR 1/8 PVC DRAIN (DRAIN) ×3 IMPLANT
FEMUR SIGMA PS SZ 3.0 R (Femur) ×3 IMPLANT
GAUZE SPONGE 4X4 12PLY STRL (GAUZE/BANDAGES/DRESSINGS) ×3 IMPLANT
GLOVE BIO SURGEON STRL SZ7 (GLOVE) ×3 IMPLANT
GLOVE BIO SURGEON STRL SZ8 (GLOVE) ×6 IMPLANT
GLOVE BIOGEL PI IND STRL 6.5 (GLOVE) ×1 IMPLANT
GLOVE BIOGEL PI IND STRL 7.0 (GLOVE) ×2 IMPLANT
GLOVE BIOGEL PI IND STRL 8 (GLOVE) ×1 IMPLANT
GLOVE BIOGEL PI INDICATOR 6.5 (GLOVE) ×2
GLOVE BIOGEL PI INDICATOR 7.0 (GLOVE) ×4
GLOVE BIOGEL PI INDICATOR 8 (GLOVE) ×2
GLOVE SURG SS PI 6.5 STRL IVOR (GLOVE) ×3 IMPLANT
GOWN STRL REUS W/TWL LRG LVL3 (GOWN DISPOSABLE) ×6 IMPLANT
HANDPIECE INTERPULSE COAX TIP (DISPOSABLE) ×2
HOLDER FOLEY CATH W/STRAP (MISCELLANEOUS) IMPLANT
IMMOBILIZER KNEE 20 (SOFTGOODS) ×6 IMPLANT
IMMOBILIZER KNEE 20 THIGH 36 (SOFTGOODS) ×1 IMPLANT
MANIFOLD NEPTUNE II (INSTRUMENTS) ×3 IMPLANT
NS IRRIG 1000ML POUR BTL (IV SOLUTION) ×3 IMPLANT
PACK TOTAL KNEE CUSTOM (KITS) ×3 IMPLANT
PAD ABD 8X10 STRL (GAUZE/BANDAGES/DRESSINGS) ×3 IMPLANT
PADDING CAST COTTON 6X4 STRL (CAST SUPPLIES) ×6 IMPLANT
PATELLA DOME PFC 38MM (Knees) ×3 IMPLANT
PIN STEINMAN FIXATION KNEE (PIN) ×3 IMPLANT
PLATE ROT INSERT 12.5MM SIZE 3 (Plate) ×3 IMPLANT
POSITIONER SURGICAL ARM (MISCELLANEOUS) ×3 IMPLANT
SET HNDPC FAN SPRY TIP SCT (DISPOSABLE) ×1 IMPLANT
STRIP CLOSURE SKIN 1/2X4 (GAUZE/BANDAGES/DRESSINGS) ×4 IMPLANT
SUT MNCRL AB 4-0 PS2 18 (SUTURE) ×3 IMPLANT
SUT STRATAFIX 0 PDS 27 VIOLET (SUTURE) ×3
SUT VIC AB 2-0 CT1 27 (SUTURE) ×6
SUT VIC AB 2-0 CT1 TAPERPNT 27 (SUTURE) ×3 IMPLANT
SUTURE STRATFX 0 PDS 27 VIOLET (SUTURE) ×1 IMPLANT
TIBIA MBT CEMENT (Knees) ×3 IMPLANT
TRAY FOLEY MTR SLVR 16FR STAT (SET/KITS/TRAYS/PACK) ×3 IMPLANT
WATER STERILE IRR 1000ML POUR (IV SOLUTION) ×6 IMPLANT
WRAP KNEE MAXI GEL POST OP (GAUZE/BANDAGES/DRESSINGS) ×3 IMPLANT
YANKAUER SUCT BULB TIP 10FT TU (MISCELLANEOUS) ×3 IMPLANT

## 2018-03-24 NOTE — Evaluation (Signed)
Physical Therapy Evaluation Patient Details Name: Zachary Lyons MRN: [id number] DOB: 18-Sep-1945 Today's Date: 03/24/2018   History of Present Illness  RTKA  Clinical Impression  The patient ambulated x 50' Plans Dc to home. Pt admitted with above diagnosis. Pt currently with functional limitations due to the deficits listed below (see PT Problem List).  Pt will benefit from skilled PT to increase their independence and safety with mobility to allow discharge to the venue listed below.       Follow Up Recommendations Follow surgeon's recommendation for DC plan and follow-up therapies    Equipment Recommendations  Rolling walker with 5" wheels(youth)    Recommendations for Other Services       Precautions / Restrictions Precautions Precautions: Knee;Fall Required Braces or Orthoses: Knee Immobilizer - Right Knee Immobilizer - Right: Discontinue once straight leg raise with < 10 degree lag      Mobility  Bed Mobility Overal bed mobility: Needs Assistance Bed Mobility: Supine to Sit;Sit to Supine     Supine to sit: Min assist Sit to supine: Min assist   General bed mobility comments: extra time  Transfers Overall transfer level: Needs assistance Equipment used: Rolling walker (2 wheeled) Transfers: Sit to/from Stand Sit to Stand: Min assist         General transfer comment: 4cues for ahdna dnright leg  Ambulation/Gait Ambulation/Gait assistance: Min Chemical engineer (Feet): 50 Feet Assistive device: Rolling walker (2 wheeled) Gait Pattern/deviations: Step-to pattern     General Gait Details: cues for sequence  Stairs            Wheelchair Mobility    Modified Rankin (Stroke Patients Only)       Balance                                             Pertinent Vitals/Pain Pain Assessment: 0-10 Pain Score: 3  Pain Descriptors / Indicators: Sore Pain Intervention(s): RN gave pain meds during session;Ice applied     Home Living Family/patient expects to be discharged to:: Private residence Living Arrangements: Spouse/significant other Available Help at Discharge: Family;Available 24 hours/day Type of Home: House Home Access: Stairs to enter Entrance Stairs-Rails: Left Entrance Stairs-Number of Steps: 8 Home Layout: Two level;Able to live on main level with bedroom/bathroom Home Equipment: None      Prior Function                 Hand Dominance        Extremity/Trunk Assessment   Upper Extremity Assessment Upper Extremity Assessment: Overall WFL for tasks assessed    Lower Extremity Assessment Lower Extremity Assessment: RLE deficits/detail RLE Deficits / Details: + SLr       Communication      Cognition Arousal/Alertness: Awake/alert Behavior During Therapy: WFL for tasks assessed/performed                                          General Comments      Exercises     Assessment/Plan    PT Assessment Patient needs continued PT services  PT Problem List Decreased strength;Decreased range of motion;Decreased activity tolerance;Decreased mobility;Decreased knowledge of precautions;Decreased knowledge of use of DME;Decreased safety awareness;Pain       PT Treatment Interventions DME instruction;Therapeutic exercise;Gait training;Functional mobility  training;Therapeutic activities;Stair training;Patient/family education    PT Goals (Current goals can be found in the Care Plan section)  Acute Rehab PT Goals Patient Stated Goal: to go home PT Goal Formulation: With patient Time For Goal Achievement: 03/29/18 Potential to Achieve Goals: Good    Frequency 7X/week   Barriers to discharge        Co-evaluation               AM-PAC PT "6 Clicks" Daily Activity  Outcome Measure Difficulty turning over in bed (including adjusting bedclothes, sheets and blankets)?: A Lot Difficulty moving from lying on back to sitting on the side of the bed?  : A Lot Difficulty sitting down on and standing up from a chair with arms (e.g., wheelchair, bedside commode, etc,.)?: A Lot Help needed moving to and from a bed to chair (including a wheelchair)?: A Lot Help needed walking in hospital room?: A Lot Help needed climbing 3-5 steps with a railing? : Total 6 Click Score: 11    End of Session Equipment Utilized During Treatment: Right knee immobilizer Activity Tolerance: Patient tolerated treatment well Patient left: in bed;with call bell/phone within reach;with bed alarm set Nurse Communication: Mobility status PT Visit Diagnosis: Unsteadiness on feet (R26.81);Pain Pain - Right/Left: Right Pain - part of body: Knee    Time: 1709-1750 PT Time Calculation (min) (ACUTE ONLY): 41 min   Charges:   PT Evaluation $PT Eval Low Complexity: 1 Low PT Treatments $Gait Training: 8-22 mins $Therapeutic Activity: 8-22 mins        [place] PT [phone]   Rada Hay 03/24/2018, 6:00 PM

## 2018-03-24 NOTE — Anesthesia Postprocedure Evaluation (Signed)
Anesthesia Post Note  Patient: Vipul Romero-Martinez  Procedure(s) Performed: RIGHT TOTAL KNEE ARTHROPLASTY (Right Knee)     Patient location during evaluation: PACU Anesthesia Type: Spinal Level of consciousness: oriented and awake and alert Pain management: pain level controlled Vital Signs Assessment: post-procedure vital signs reviewed and stable Respiratory status: spontaneous breathing, respiratory function stable and patient connected to nasal cannula oxygen Cardiovascular status: blood pressure returned to baseline and stable Postop Assessment: no headache, no backache and no apparent nausea or vomiting Anesthetic complications: no    Last Vitals:  Vitals:   03/24/18 1030 03/24/18 1045  BP: (!) 168/84 (!) 169/98  Pulse: (!) 52 (!) 59  Resp: 16 15  Temp:    SpO2: 100% 100%    Last Pain:  Vitals:   03/24/18 1030  TempSrc:   PainSc: 0-No pain                 Jency Schnieders S

## 2018-03-24 NOTE — Anesthesia Preprocedure Evaluation (Signed)
Anesthesia Evaluation  Patient identified by MRN, date of birth, ID band Patient awake    Reviewed: Allergy & Precautions, NPO status , Patient's Chart, lab work & pertinent test results  Airway Mallampati: II  TM Distance: >3 FB Neck ROM: Full    Dental no notable dental hx.    Pulmonary neg pulmonary ROS, former smoker,    Pulmonary exam normal breath sounds clear to auscultation       Cardiovascular hypertension, Normal cardiovascular exam Rhythm:Regular Rate:Normal     Neuro/Psych negative neurological ROS  negative psych ROS   GI/Hepatic PUD, GERD  ,(+)     substance abuse  alcohol use,   Endo/Other  negative endocrine ROS  Renal/GU negative Renal ROS  negative genitourinary   Musculoskeletal negative musculoskeletal ROS (+)   Abdominal   Peds negative pediatric ROS (+)  Hematology negative hematology ROS (+)   Anesthesia Other Findings   Reproductive/Obstetrics negative OB ROS                             Anesthesia Physical Anesthesia Plan  ASA: III  Anesthesia Plan: Spinal   Post-op Pain Management:    Induction: Intravenous  PONV Risk Score and Plan: 1 and Ondansetron, Dexamethasone and Treatment may vary due to age or medical condition  Airway Management Planned: Simple Face Mask  Additional Equipment:   Intra-op Plan:   Post-operative Plan:   Informed Consent: I have reviewed the patients History and Physical, chart, labs and discussed the procedure including the risks, benefits and alternatives for the proposed anesthesia with the patient or authorized representative who has indicated his/her understanding and acceptance.   Dental advisory given  Plan Discussed with: CRNA and Surgeon  Anesthesia Plan Comments:         Anesthesia Quick Evaluation

## 2018-03-24 NOTE — Plan of Care (Signed)
Postoperative course discussed with patient 

## 2018-03-24 NOTE — Discharge Instructions (Signed)
° °Dr. Frank Aluisio °Total Joint Specialist °[facility] °[address] °[place], [place] [address] °[phone] ° °TOTAL KNEE REPLACEMENT POSTOPERATIVE DIRECTIONS ° °Knee Rehabilitation, Guidelines Following Surgery  °Results after knee surgery are often greatly improved when you follow the exercise, range of motion and muscle strengthening exercises prescribed by your doctor. Safety measures are also important to protect the knee from further injury. Any time any of these exercises cause you to have increased pain or swelling in your knee joint, decrease the amount until you are comfortable again and slowly increase them. If you have problems or questions, call your caregiver or physical therapist for advice.  ° °HOME CARE INSTRUCTIONS  °Remove items at home which could result in a fall. This includes throw rugs or furniture in walking pathways.  °· ICE to the affected knee every three hours for 30 minutes at a time and then as needed for pain and swelling.  Continue to use ice on the knee for pain and swelling from surgery. You may notice swelling that will progress down to the foot and ankle.  This is normal after surgery.  Elevate the leg when you are not up walking on it.   °· Continue to use the breathing machine which will help keep your temperature down.  It is common for your temperature to cycle up and down following surgery, especially at night when you are not up moving around and exerting yourself.  The breathing machine keeps your lungs expanded and your temperature down. °· Do not place pillow under knee, focus on keeping the knee straight while resting ° °DIET °You may resume your previous home diet once your are discharged from the hospital. ° °DRESSING / WOUND CARE / SHOWERING °You may change your dressing every day with sterile gauze.  Please use good hand washing techniques before changing the dressing.  Do not use any lotions or creams on the incision until instructed by your  surgeon. °You may start showering once you are discharged home but do not submerge the incision under water. Just pat the incision dry and apply a dry gauze dressing on daily. °Change the surgical dressing daily and reapply a dry dressing each time. ° °ACTIVITY °Walk with your walker as instructed. °Use walker as long as suggested by your caregivers. °Avoid periods of inactivity such as sitting longer than an hour when not asleep. This helps prevent blood clots.  °You may resume a sexual relationship in one month or when given the OK by your doctor.  °You may return to work once you are cleared by your doctor.  °Do not drive a car for 6 weeks or until released by you surgeon.  °Do not drive while taking narcotics. ° °WEIGHT BEARING °Weight bearing as tolerated with assist device (walker, cane, etc) as directed, use it as long as suggested by your surgeon or therapist, typically at least 4-6 weeks. ° °POSTOPERATIVE CONSTIPATION PROTOCOL °Constipation - defined medically as fewer than three stools per week and severe constipation as less than one stool per week. ° °One of the most common issues patients have following surgery is constipation.  Even if you have a regular bowel pattern at home, your normal regimen is likely to be disrupted due to multiple reasons following surgery.  Combination of anesthesia, postoperative narcotics, change in appetite and fluid intake all can affect your bowels.  In order to avoid complications following surgery, here are some recommendations in order to help you during your recovery period. ° °  Colace (docusate) - Pick up an over-the-counter form of Colace or another stool softener and take twice a day as long as you are requiring postoperative pain medications.  Take with a full glass of water daily.  If you experience loose stools or diarrhea, hold the colace until you stool forms back up.  If your symptoms do not get better within 1 week or if they get worse, check with your  doctor. ° °Dulcolax (bisacodyl) - Pick up over-the-counter and take as directed by the product packaging as needed to assist with the movement of your bowels.  Take with a full glass of water.  Use this product as needed if not relieved by Colace only.  ° °MiraLax (polyethylene glycol) - Pick up over-the-counter to have on hand.  MiraLax is a solution that will increase the amount of water in your bowels to assist with bowel movements.  Take as directed and can mix with a glass of water, juice, soda, coffee, or tea.  Take if you go more than two days without a movement. °Do not use MiraLax more than once per day. Call your doctor if you are still constipated or irregular after using this medication for 7 days in a row. ° °If you continue to have problems with postoperative constipation, please contact the office for further assistance and recommendations.  If you experience "the worst abdominal pain ever" or develop nausea or vomiting, please contact the office immediatly for further recommendations for treatment. ° °ITCHING ° If you experience itching with your medications, try taking only a single pain pill, or even half a pain pill at a time.  You can also use Benadryl over the counter for itching or also to help with sleep.  ° °TED HOSE STOCKINGS °Wear the elastic stockings on both legs for three weeks following surgery during the day but you may remove then at night for sleeping. ° °MEDICATIONS °See your medication summary on the “After Visit Summary” that the nursing staff will review with you prior to discharge.  You may have some home medications which will be placed on hold until you complete the course of blood thinner medication.  It is important for you to complete the blood thinner medication as prescribed by your surgeon.  Continue your approved medications as instructed at time of discharge. ° °PRECAUTIONS °If you experience chest pain or shortness of breath - call 911 immediately for transfer to the  hospital emergency department.  °If you develop a fever greater that 101 F, purulent drainage from wound, increased redness or drainage from wound, foul odor from the wound/dressing, or calf pain - CONTACT YOUR SURGEON.   °                                                °FOLLOW-UP APPOINTMENTS °Make sure you keep all of your appointments after your operation with your surgeon and caregivers. You should call the office at the above phone number and make an appointment for approximately two weeks after the date of your surgery or on the date instructed by your surgeon outlined in the "After Visit Summary". ° ° °RANGE OF MOTION AND STRENGTHENING EXERCISES  °Rehabilitation of the knee is important following a knee injury or an operation. After just a few days of immobilization, the muscles of the thigh which control the knee become weakened and   shrink (atrophy). Knee exercises are designed to build up the tone and strength of the thigh muscles and to improve knee motion. Often times heat used for twenty to thirty minutes before working out will loosen up your tissues and help with improving the range of motion but do not use heat for the first two weeks following surgery. These exercises can be done on a training (exercise) mat, on the floor, on a table or on a bed. Use what ever works the best and is most comfortable for you Knee exercises include:  °Leg Lifts - While your knee is still immobilized in a splint or cast, you can do straight leg raises. Lift the leg to 60 degrees, hold for 3 sec, and slowly lower the leg. Repeat 10-20 times 2-3 times daily. Perform this exercise against resistance later as your knee gets better.  °Quad and Hamstring Sets - Tighten up the muscle on the front of the thigh (Quad) and hold for 5-10 sec. Repeat this 10-20 times hourly. Hamstring sets are done by pushing the foot backward against an object and holding for 5-10 sec. Repeat as with quad sets.  °· Leg Slides: Lying on your back,  slowly slide your foot toward your buttocks, bending your knee up off the floor (only go as far as is comfortable). Then slowly slide your foot back down until your leg is flat on the floor again. °· Angel Wings: Lying on your back spread your legs to the side as far apart as you can without causing discomfort.  °A rehabilitation program following serious knee injuries can speed recovery and prevent re-injury in the future due to weakened muscles. Contact your doctor or a physical therapist for more information on knee rehabilitation.  ° °IF YOU ARE TRANSFERRED TO A SKILLED REHAB FACILITY °If the patient is transferred to a skilled rehab facility following release from the hospital, a list of the current medications will be sent to the facility for the patient to continue.  When discharged from the skilled rehab facility, please have the facility set up the patient's Home Health Physical Therapy prior to being released. Also, the skilled facility will be responsible for providing the patient with their medications at time of release from the facility to include their pain medication, the muscle relaxants, and their blood thinner medication. If the patient is still at the rehab facility at time of the two week follow up appointment, the skilled rehab facility will also need to assist the patient in arranging follow up appointment in our office and any transportation needs. ° °MAKE SURE YOU:  °Understand these instructions.  °Get help right away if you are not doing well or get worse.  ° ° °Pick up stool softner and laxative for home use following surgery while on pain medications. °Do not submerge incision under water. °Please use good hand washing techniques while changing dressing each day. °May shower starting three days after surgery. °Please use a clean towel to pat the incision dry following showers. °Continue to use ice for pain and swelling after surgery. °Do not use any lotions or creams on the incision  until instructed by your surgeon. °

## 2018-03-24 NOTE — Op Note (Signed)
OPERATIVE REPORT-TOTAL KNEE ARTHROPLASTY   Pre-operative diagnosis- Osteoarthritis  Right knee(s)  Post-operative diagnosis- Osteoarthritis Right knee(s)  Procedure-  Right  Total Knee Arthroplasty  Surgeon- Zachary Lyons. Noa Galvao, MD  Assistant- Dimitri Ped, PA-C   Anesthesia-  Adductor canal block and spinal  EBL-10 mL   Drains Hemovac  Tourniquet time-  Total Tourniquet Time Documented: Thigh (Right) - 30 minutes Total: Thigh (Right) - 30 minutes     Complications- None  Condition-PACU - hemodynamically stable.   Brief Clinical Note  Zachary Lyons is a 72 y.o. year old male with end stage OA of his right knee with progressively worsening pain and dysfunction. He has constant pain, with activity and at rest and significant functional deficits with difficulties even with ADLs. He has had extensive non-op management including analgesics, injections of cortisone and viscosupplements, and home exercise program, but remains in significant pain with significant dysfunction. Radiographs show bone on bone arthritis medial and patellofemoral. He presents now for right Total Knee Arthroplasty.    Procedure in detail---   The patient is brought into the operating room and positioned supine on the operating table. After successful administration of  Adductor canal block and spinal,   a tourniquet is placed high on the  Right thigh(s) and the lower extremity is prepped and draped in the usual sterile fashion. Time out is performed by the operating team and then the  Right lower extremity is wrapped in Esmarch, knee flexed and the tourniquet inflated to 300 mmHg.       A midline incision is made with a ten blade through the subcutaneous tissue to the level of the extensor mechanism. A fresh blade is used to make a medial parapatellar arthrotomy. Soft tissue over the proximal medial tibia is subperiosteally elevated to the joint line with a knife and into the semimembranosus bursa with  a Cobb elevator. Soft tissue over the proximal lateral tibia is elevated with attention being paid to avoiding the patellar tendon on the tibial tubercle. The patella is everted, knee flexed 90 degrees and the ACL and PCL are removed. Findings are bone on bone medial and patellofemoral with large medial and patellar osteophytes.        The drill is used to create a starting hole in the distal femur and the canal is thoroughly irrigated with sterile saline to remove the fatty contents. The 5 degree Right  valgus alignment guide is placed into the femoral canal and the distal femoral cutting block is pinned to remove 10 mm off the distal femur. Resection is made with an oscillating saw.      The tibia is subluxed forward and the menisci are removed. The extramedullary alignment guide is placed referencing proximally at the medial aspect of the tibial tubercle and distally along the second metatarsal axis and tibial crest. The block is pinned to remove 2mm off the more deficient medial  side. Resection is made with an oscillating saw. Size 3is the most appropriate size for the tibia and the proximal tibia is prepared with the modular drill and keel punch for that size.      The femoral sizing guide is placed and size 3 is most appropriate. Rotation is marked off the epicondylar axis and confirmed by creating a rectangular flexion gap at 90 degrees. The size 3 cutting block is pinned in this rotation and the anterior, posterior and chamfer cuts are made with the oscillating saw. The intercondylar block is then placed and that cut is made.  Trial size 3 tibial component, trial size 3 posterior stabilized femur and a 12.5  mm posterior stabilized rotating platform insert trial is placed. Full extension is achieved with excellent varus/valgus and anterior/posterior balance throughout full range of motion. The patella is everted and thickness measured to be 24  mm. Free hand resection is taken to 14 mm, a 38  template is placed, lug holes are drilled, trial patella is placed, and it tracks normally. Osteophytes are removed off the posterior femur with the trial in place. All trials are removed and the cut bone surfaces prepared with pulsatile lavage. Cement is mixed and once ready for implantation, the size 3 tibial implant, size  3 posterior stabilized femoral component, and the size 38 patella are cemented in place and the patella is held with the clamp. The trial insert is placed and the knee held in full extension. The Exparel (20 ml mixed with 60 ml saline) is injected into the extensor mechanism, posterior capsule, medial and lateral gutters and subcutaneous tissues.  All extruded cement is removed and once the cement is hard the permanent 12.5 mm posterior stabilized rotating platform insert is placed into the tibial tray.      The wound is copiously irrigated with saline solution and the extensor mechanism closed over a hemovac drain with #1 V-loc suture. The tourniquet is released for a total tourniquet time of 30  minutes. Flexion against gravity is 140 degrees and the patella tracks normally. Subcutaneous tissue is closed with 2.0 vicryl and subcuticular with running 4.0 Monocryl. The incision is cleaned and dried and steri-strips and a bulky sterile dressing are applied. The limb is placed into a knee immobilizer and the patient is awakened and transported to recovery in stable condition.      Please note that a surgical assistant was a medical necessity for this procedure in order to perform it in a safe and expeditious manner. Surgical assistant was necessary to retract the ligaments and vital neurovascular structures to prevent injury to them and also necessary for proper positioning of the limb to allow for anatomic placement of the prosthesis.   Zachary Lyons Zachary Vandeberg, MD    03/24/2018, 9:02 AM

## 2018-03-24 NOTE — Transfer of Care (Signed)
Immediate Anesthesia Transfer of Care Note  Patient: Zachary Lyons  Procedure(s) Performed: RIGHT TOTAL KNEE ARTHROPLASTY (Right Knee)  Patient Location: PACU  Anesthesia Type:Spinal and MAC combined with regional for post-op pain  Level of Consciousness: awake, alert , oriented and patient cooperative  Airway & Oxygen Therapy: Patient Spontanous Breathing and Patient connected to face mask oxygen  Post-op Assessment: Report given to RN and Post -op Vital signs reviewed and stable  Post vital signs: Reviewed and stable  Last Vitals:  Vitals Value Taken Time  BP 129/67 03/24/2018  9:20 AM  Temp    Pulse 63 03/24/2018  9:23 AM  Resp 14 03/24/2018  9:23 AM  SpO2 100 % 03/24/2018  9:23 AM  Vitals shown include unvalidated device data.  Last Pain:  Vitals:   03/24/18 0625  TempSrc: Oral      Patients Stated Pain Goal: 4 (44/03/47 4259)  Complications: No apparent anesthesia complications

## 2018-03-24 NOTE — Progress Notes (Signed)
Assisted Dr. Rose with right, ultrasound guided, adductor canal block. Side rails up, monitors on throughout procedure. See vital signs in flow sheet. Tolerated Procedure well.  

## 2018-03-24 NOTE — Anesthesia Procedure Notes (Addendum)
Spinal  Start time: 03/24/2018 8:07 AM End time: 03/24/2018 8:10 AM Staffing Anesthesiologist: Eilene Ghazi, MD Resident/CRNA: Vanessa Regan, CRNA Performed: resident/CRNA  Preanesthetic Checklist Completed: patient identified, site marked, surgical consent, pre-op evaluation, timeout performed, IV checked, risks and benefits discussed and monitors and equipment checked Spinal Block Patient position: sitting Prep: ChloraPrep Patient monitoring: heart rate, cardiac monitor, continuous pulse ox and blood pressure Approach: midline Location: L3-4 Injection technique: single-shot Needle Needle type: Pencan  Needle gauge: 24 G Needle length: 10 cm Needle insertion depth: 8 cm Assessment Sensory level: T6

## 2018-03-24 NOTE — Interval H&P Note (Signed)
History and Physical Interval Note:  03/24/2018 6:31 AM  Zachary Lyons  has presented today for surgery, with the diagnosis of right knee osteoarthritis  The various methods of treatment have been discussed with the patient and family. After consideration of risks, benefits and other options for treatment, the patient has consented to  Procedure(s): RIGHT TOTAL KNEE ARTHROPLASTY (Right) as a surgical intervention .  The patient's history has been reviewed, patient examined, no change in status, stable for surgery.  I have reviewed the patient's chart and labs.  Questions were answered to the patient's satisfaction.     Homero Fellers Keshonda Monsour

## 2018-03-24 NOTE — Anesthesia Procedure Notes (Signed)
Anesthesia Regional Block: Adductor canal block   Pre-Anesthetic Checklist: ,, timeout performed, Correct Patient, Correct Site, Correct Laterality, Correct Procedure, Correct Position, site marked, Risks and benefits discussed,  Surgical consent,  Pre-op evaluation,  At surgeon's request and post-op pain management  Laterality: Right  Prep: chloraprep       Needles:  Injection technique: Single-shot  Needle Type: Echogenic Needle     Needle Length: 9cm      Additional Needles:   Procedures:,,,, ultrasound used (permanent image in chart),,,,  Narrative:  Start time: 03/24/2018 7:42 AM End time: 03/24/2018 7:51 AM Injection made incrementally with aspirations every 5 mL.  Performed by: Personally  Anesthesiologist: Eilene Ghazi, MD  Additional Notes: Patient tolerated the procedure well without complications

## 2018-03-25 ENCOUNTER — Encounter (HOSPITAL_COMMUNITY): Payer: Self-pay | Admitting: Orthopedic Surgery

## 2018-03-25 LAB — CBC
HEMATOCRIT: 37.6 % — AB (ref 39.0–52.0)
HEMOGLOBIN: 12.5 g/dL — AB (ref 13.0–17.0)
MCH: 30.3 pg (ref 26.0–34.0)
MCHC: 33.2 g/dL (ref 30.0–36.0)
MCV: 91 fL (ref 78.0–100.0)
Platelets: 162 10*3/uL (ref 150–400)
RBC: 4.13 MIL/uL — ABNORMAL LOW (ref 4.22–5.81)
RDW: 14.3 % (ref 11.5–15.5)
WBC: 13.6 10*3/uL — ABNORMAL HIGH (ref 4.0–10.5)

## 2018-03-25 LAB — BASIC METABOLIC PANEL
Anion gap: 8 (ref 5–15)
BUN: 11 mg/dL (ref 8–23)
CALCIUM: 8.6 mg/dL — AB (ref 8.9–10.3)
CO2: 26 mmol/L (ref 22–32)
CREATININE: 1.03 mg/dL (ref 0.61–1.24)
Chloride: 104 mmol/L (ref 98–111)
GFR calc Af Amer: >60 mL/min (ref >60)
Glucose, Bld: 251 mg/dL — ABNORMAL HIGH (ref 70–99)
Potassium: 4.1 mmol/L (ref 3.5–5.1)
SODIUM: 138 mmol/L (ref 135–145)

## 2018-03-25 NOTE — Care Management Note (Signed)
Case Management Note  Patient Details  Name: Zachary Lyons MRN: [id number] Date of Birth: December 31, 1945  Subjective/Objective:     Spoke with patient at bedside. Confirmed plan for OP PT, already arranged. Needs a RW. [phone]               Action/Plan: Contacted [facility] to deliver to the room.  Expected Discharge Date:                  Expected Discharge Plan:  OP Rehab  In-House Referral:  NA  Discharge planning Services  CM Consult  Post Acute Care Choice:  Durable Medical Equipment Choice offered to:  Patient  DME Arranged:  Walker rolling DME Agency:  [facility]  HH Arranged:  NA HH Agency:  NA  Status of Service:  Completed, signed off  If discussed at Long Length of Stay Meetings, dates discussed:    Additional Comments:  Alexis Goodell, RN 03/25/2018, 11:26 AM

## 2018-03-25 NOTE — Progress Notes (Signed)
Physical Therapy Treatment Patient Details Name: Zachary Lyons MRN: [id number] DOB: [birth year] Today's Date: 03/25/2018    History of Present Illness RTKA    PT Comments    Patient requires cues for safety. Ambulates without KI today. Plan to practice steps in AM.    Follow Up Recommendations  Follow surgeon's recommendation for DC plan and follow-up therapies;Outpatient PT     Equipment Recommendations  Rolling walker with 5" wheels    Recommendations for Other Services       Precautions / Restrictions Precautions Precautions: Knee;Fall Precaution Comments: did not use this visit Required Braces or Orthoses: Knee Immobilizer - Right Knee Immobilizer - Right: Discontinue once straight leg raise with < 10 degree lag    Mobility  Bed Mobility         Supine to sit: Supervision Sit to supine: Supervision   General bed mobility comments: cues for safety, impulsive  Transfers   Equipment used: Rolling walker (2 wheeled) Transfers: Sit to/from Stand Sit to Stand: Min assist         General transfer comment: cues for hand and right leg, although patient did not listen to instruction  Ambulation/Gait Ambulation/Gait assistance: Min assist Gait Distance (Feet): 100 Feet Assistive device: Rolling walker (2 wheeled) Gait Pattern/deviations: Step-to pattern     General Gait Details: cues for sequence   Stairs             Wheelchair Mobility    Modified Rankin (Stroke Patients Only)       Balance                                            Cognition Arousal/Alertness: Awake/alert Behavior During Therapy: Impulsive Overall Cognitive Status: Within Functional Limits for tasks assessed                                        Exercises   General Comments        Pertinent Vitals/Pain Pain Score: 5  Pain Location: right knee  Pain Descriptors / Indicators: Aching Pain Intervention(s): Monitored during  session;Repositioned;Ice applied;Limited activity within patient's tolerance    Home Living                      Prior Function            PT Goals (current goals can now be found in the care plan section) Progress towards PT goals: Progressing toward goals    Frequency    7X/week      PT Plan Current plan remains appropriate    Co-evaluation              AM-PAC PT "6 Clicks" Daily Activity  Outcome Measure  Difficulty turning over in bed (including adjusting bedclothes, sheets and blankets)?: A Little Difficulty moving from lying on back to sitting on the side of the bed? : A Little Difficulty sitting down on and standing up from a chair with arms (e.g., wheelchair, bedside commode, etc,.)?: A Little Help needed moving to and from a bed to chair (including a wheelchair)?: A Lot Help needed walking in hospital room?: A Lot Help needed climbing 3-5 steps with a railing? : Total 6 Click Score: 14    End of Session   Activity Tolerance:  Patient tolerated treatment well Patient left: in bed;with call bell/phone within reach;with bed alarm set Nurse Communication: Mobility status PT Visit Diagnosis: Unsteadiness on feet (R26.81);Pain Pain - Right/Left: Right Pain - part of body: Knee     Time: 1400-1435 PT Time Calculation (min) (ACUTE ONLY): 35 min  Charges:  $Gait Training: 23-37 mins $Therapeutic Exercise: 8-22 mins                     Blanchard Kelch PT Acute Rehabilitation Services Pager [phone] Office [phone]    Rada Hay 03/25/2018, 2:52 PM

## 2018-03-25 NOTE — Progress Notes (Signed)
Physical Therapy Treatment Patient Details Name: Zachary Lyons MRN: [id number] DOB: 09-10-1945 Today's Date: 03/25/2018    History of Present Illness RTKA    PT Comments    The p[atient is progressing well. Amulated x100' without KI. Cues for safety. Plans DC tomorrow.    Follow Up Recommendations  Follow surgeon's recommendation for DC plan and follow-up therapies     Equipment Recommendations  Rolling walker with 5" wheels    Recommendations for Other Services       Precautions / Restrictions Precautions Precautions: Knee;Fall Precaution Comments: did not use this visit Required Braces or Orthoses: Knee Immobilizer - Right Knee Immobilizer - Right: Discontinue once straight leg raise with < 10 degree lag Restrictions Weight Bearing Restrictions: No    Mobility  Bed Mobility         Supine to sit: Supervision     General bed mobility comments: cues for safety, impulsive  Transfers   Equipment used: Rolling walker (2 wheeled) Transfers: Sit to/from Stand Sit to Stand: Min assist         General transfer comment: cues for hand and right leg  Ambulation/Gait Ambulation/Gait assistance: Min assist Gait Distance (Feet): 100 Feet Assistive device: Rolling walker (2 wheeled) Gait Pattern/deviations: Step-to pattern;Step-through pattern     General Gait Details: cues for sequence   Stairs             Wheelchair Mobility    Modified Rankin (Stroke Patients Only)       Balance                                            Cognition Arousal/Alertness: Awake/alert Behavior During Therapy: Impulsive                                          Exercises Total Joint Exercises Ankle Circles/Pumps: AROM;Supine;Left;Right Quad Sets: AROM;Supine;Both Heel Slides: AAROM;Supine;Right;10 reps Hip ABduction/ADduction: 10 reps Straight Leg Raises: AAROM;Supine;Right;20 reps Knee Flexion: AROM;Seated;Right;15  reps Goniometric ROM: 10-60 right knee flexion    General Comments        Pertinent Vitals/Pain Pain Score: 5  Pain Location: right knee  Pain Descriptors / Indicators: Aching Pain Intervention(s): Limited activity within patient's tolerance;Monitored during session;Premedicated before session    Home Living                      Prior Function            PT Goals (current goals can now be found in the care plan section) Progress towards PT goals: Progressing toward goals    Frequency    7X/week      PT Plan Current plan remains appropriate    Co-evaluation              AM-PAC PT "6 Clicks" Daily Activity  Outcome Measure  Difficulty turning over in bed (including adjusting bedclothes, sheets and blankets)?: A Little Difficulty moving from lying on back to sitting on the side of the bed? : A Little Difficulty sitting down on and standing up from a chair with arms (e.g., wheelchair, bedside commode, etc,.)?: A Little Help needed moving to and from a bed to chair (including a wheelchair)?: A Lot Help needed walking in hospital room?: A Lot Help  needed climbing 3-5 steps with a railing? : Total 6 Click Score: 14    End of Session   Activity Tolerance: Patient tolerated treatment well Patient left: in chair;with call bell/phone within reach;with nursing/sitter in room;with chair alarm set Nurse Communication: Mobility status PT Visit Diagnosis: Unsteadiness on feet (R26.81);Pain Pain - Right/Left: Right Pain - part of body: Knee     Time: 1053-1130 PT Time Calculation (min) (ACUTE ONLY): 37 min  Charges:  $Gait Training: 8-22 mins $Therapeutic Exercise: 8-22 mins                     [facility] PT [phone]    Rada Hay 03/25/2018, 11:47 AM

## 2018-03-25 NOTE — Plan of Care (Signed)
  Problem: Education: Goal: Knowledge of General Education information will improve Description Including pain rating scale, medication(s)/side effects and non-pharmacologic comfort measures Outcome: Progressing   Problem: Health Behavior/Discharge Planning: Goal: Ability to manage health-related needs will improve Outcome: Progressing   Problem: Clinical Measurements: Goal: Ability to maintain clinical measurements within normal limits will improve Outcome: Progressing Goal: Will remain free from infection Outcome: Progressing Goal: Respiratory complications will improve Outcome: Progressing   Problem: Elimination: Goal: Will not experience complications related to bowel motility Outcome: Progressing   Problem: Pain Managment: Goal: General experience of comfort will improve Outcome: Progressing   Problem: Safety: Goal: Ability to remain free from injury will improve Outcome: Progressing   Problem: Education: Goal: Knowledge of the prescribed therapeutic regimen will improve Outcome: Progressing   Problem: Pain Management: Goal: Pain level will decrease with appropriate interventions Outcome: Progressing   Reeves Forth, RN 03/25/18 2:53 AM

## 2018-03-25 NOTE — Progress Notes (Addendum)
   Subjective: 1 Day Post-Op Procedure(s) (LRB): RIGHT TOTAL KNEE ARTHROPLASTY (Right) Patient reports pain as mild.   Patient seen in rounds by Dr. Lequita Halt. Patient is well, and has had no acute complaints or problems. No issues overnight. Foley catheter removed this AM. Denies chest pain, SOB or calf pain. Did well ambulating with PT yesterday, will continue working with therapy today. .   Objective: Vital signs in last 24 hours: Temp:  [96.6 F (35.9 C)-98.1 F (36.7 C)] 98.1 F (36.7 C) (09/10 0522) Pulse Rate:  [47-71] 60 (09/10 0522) Resp:  [7-20] 16 (09/10 0522) BP: (116-174)/(64-98) 150/80 (09/10 0522) SpO2:  [98 %-100 %] 100 % (09/10 0522)  Intake/Output from previous day:  Intake/Output Summary (Last 24 hours) at 03/25/2018 0727 Last data filed at 03/25/2018 0600 Gross per 24 hour  Intake 3919 ml  Output 3890 ml  Net 29 ml     Labs: Recent Labs    03/25/18 0613  HGB 12.5*   Recent Labs    03/25/18 0613  WBC 13.6*  RBC 4.13*  HCT 37.6*  PLT 162   Recent Labs    03/25/18 0441  NA 138  K 4.1  CL 104  CO2 26  BUN 11  CREATININE 1.03  GLUCOSE 251*  CALCIUM 8.6*   Exam: General - Patient is Alert and Oriented Extremity - Neurologically intact Neurovascular intact Sensation intact distally Dorsiflexion/Plantar flexion intact Dressing - dressing C/D/I Motor Function - intact, moving foot and toes well on exam.   Past Medical History:  Diagnosis Date  . Arthritis    osteoarthritis  . GERD (gastroesophageal reflux disease)   . Hypertension   . Nasal polyps    removed  . Peptic ulcer    hx. of  . Pre-diabetes   . Transfusion history 04-22-13   15 yrs ago-"bleeding ulcer"    Assessment/Plan: 1 Day Post-Op Procedure(s) (LRB): RIGHT TOTAL KNEE ARTHROPLASTY (Right) Active Problems:   OA (osteoarthritis) of knee  Estimated body mass index is 28.52 kg/m as calculated from the following:   Height as of this encounter: 5\' 3"  (1.6 m).  Weight as of this encounter: 73 kg. Advance diet Up with therapy  Anticipated LOS equal to or greater than 2 midnights due to - Age 72 and older with one or more of the following:  - Obesity  - Expected need for hospital services (PT, OT, Nursing) required for safe  discharge  - Anticipated need for postoperative skilled nursing care or inpatient rehab  - Active co-morbidities: None OR   - Unanticipated findings during/Post Surgery: None  - Patient is a high risk of re-admission due to: None    DVT Prophylaxis - Aspirin Weight bearing as tolerated. D/C O2 and pulse ox and try on room air. Hemovac pulled without difficulty, will continue therapy today.  Glucose elevated this AM, will continue to monitor.  Plan is to go Home after hospital stay. Plan for discharge tomorrow if progresses with therapy and is meeting his goals.   Arther Abbott, PA-C Orthopedic Surgery 03/25/2018, 7:27 AM

## 2018-03-26 LAB — BASIC METABOLIC PANEL
ANION GAP: 9 (ref 5–15)
BUN: 13 mg/dL (ref 8–23)
CO2: 29 mmol/L (ref 22–32)
CREATININE: 1.03 mg/dL (ref 0.61–1.24)
Calcium: 8.4 mg/dL — ABNORMAL LOW (ref 8.9–10.3)
Chloride: 100 mmol/L (ref 98–111)
Glucose, Bld: 177 mg/dL — ABNORMAL HIGH (ref 70–99)
Potassium: 3.1 mmol/L — ABNORMAL LOW (ref 3.5–5.1)
SODIUM: 138 mmol/L (ref 135–145)

## 2018-03-26 LAB — CBC
HEMATOCRIT: 35.4 % — AB (ref 39.0–52.0)
Hemoglobin: 11.7 g/dL — ABNORMAL LOW (ref 13.0–17.0)
MCH: 30.3 pg (ref 26.0–34.0)
MCHC: 33.1 g/dL (ref 30.0–36.0)
MCV: 91.7 fL (ref 78.0–100.0)
Platelets: 159 10*3/uL (ref 150–400)
RBC: 3.86 MIL/uL — ABNORMAL LOW (ref 4.22–5.81)
RDW: 14.7 % (ref 11.5–15.5)
WBC: 10.3 10*3/uL (ref 4.0–10.5)

## 2018-03-26 MED ORDER — METHOCARBAMOL 500 MG PO TABS: 500.0000 mg | ORAL_TABLET | Freq: Four times a day (QID) | ORAL | 0 refills | Status: DC | PRN

## 2018-03-26 MED ORDER — OXYCODONE HCL 5 MG PO TABS: 5.0000 mg | ORAL_TABLET | Freq: Four times a day (QID) | ORAL | 0 refills | Status: DC | PRN

## 2018-03-26 MED ORDER — ASPIRIN 325 MG PO TBEC: 325.0000 mg | DELAYED_RELEASE_TABLET | Freq: Two times a day (BID) | ORAL | 0 refills | Status: AC

## 2018-03-26 MED ORDER — TRAMADOL HCL 50 MG PO TABS: 50.0000 mg | ORAL_TABLET | Freq: Four times a day (QID) | ORAL | 0 refills | Status: DC | PRN

## 2018-03-26 NOTE — Progress Notes (Signed)
Physical Therapy Treatment Patient Details Name: Zachary Lyons MRN: [id number] DOB: Jun 25, 1946 Today's Date: 03/26/2018    History of Present Illness RTKA    PT Comments    *The patient is progressing well. Will practice steps then ready for Dc.  Follow Up Recommendations  Follow surgeon's recommendation for DC plan and follow-up therapies;Outpatient PT     Equipment Recommendations  Rolling walker with 5" wheels    Recommendations for Other Services       Precautions / Restrictions Precautions Precautions: Knee;Fall Precaution Comments: to use KI for going home and on steps- informed patient of theis    Mobility  Bed Mobility Overal bed mobility: Modified Independent                Transfers   Equipment used: Rolling walker (2 wheeled) Transfers: Sit to/from Stand Sit to Stand: Supervision         General transfer comment: cues for hand and right leg, although patient did not listen to instruction  Ambulation/Gait Ambulation/Gait assistance: Supervision Gait Distance (Feet): 110 Feet Assistive device: Rolling walker (2 wheeled) Gait Pattern/deviations: Step-through pattern Gait velocity: decreased   General Gait Details: cues for sequence   Stairs             Wheelchair Mobility    Modified Rankin (Stroke Patients Only)       Balance                                            Cognition Arousal/Alertness: Awake/alert                                            Exercises Total Joint Exercises Ankle Circles/Pumps: AROM;Supine;Left;Right Quad Sets: AROM;Supine;Both Short Arc Quad: AAROM;Supine Heel Slides: AAROM;Supine;Right;10 reps Hip ABduction/ADduction: 10 reps;AROM;Right Straight Leg Raises: AAROM;Supine;Right;20 reps Goniometric ROM: 10-60    General Comments        Pertinent Vitals/Pain Pain Score: 3  Pain Location: right knee  Pain Descriptors / Indicators:  Discomfort Pain Intervention(s): Monitored during session    Home Living                      Prior Function            PT Goals (current goals can now be found in the care plan section) Progress towards PT goals: Progressing toward goals    Frequency    7X/week      PT Plan Current plan remains appropriate    Co-evaluation              AM-PAC PT "6 Clicks" Daily Activity  Outcome Measure  Difficulty turning over in bed (including adjusting bedclothes, sheets and blankets)?: A Little Difficulty moving from lying on back to sitting on the side of the bed? : A Little Difficulty sitting down on and standing up from a chair with arms (e.g., wheelchair, bedside commode, etc,.)?: A Little Help needed moving to and from a bed to chair (including a wheelchair)?: A Little Help needed walking in hospital room?: A Little Help needed climbing 3-5 steps with a railing? : Total 6 Click Score: 16    End of Session Equipment Utilized During Treatment: Right knee immobilizer Activity Tolerance: Patient tolerated treatment well Patient left:  in bed;with call bell/phone within reach;with bed alarm set Nurse Communication: Mobility status PT Visit Diagnosis: Unsteadiness on feet (R26.81);Pain Pain - Right/Left: Right Pain - part of body: Knee     Time: 7408-1448 PT Time Calculation (min) (ACUTE ONLY): 27 min  Charges:  $Gait Training: 8-22 mins $Therapeutic Exercise: 8-22 mins                      Zachary Lyons PT Acute Rehabilitation Services Pager [phone] Office [phone]    Zachary Lyons 03/26/2018, 11:27 AM

## 2018-03-26 NOTE — Progress Notes (Signed)
Physical Therapy Treatment Patient Details Name: Zachary Lyons MRN: [id number] DOB: August 28, 1945 Today's Date: 03/26/2018    History of Present Illness RTKA    PT Comments    Patiert practiced 4 steps. Provided written/picute instructions for Steps and HEP.  Family not present. Ready for Dc.  Follow Up Recommendations  Follow surgeon's recommendation for DC plan and follow-up therapies;Outpatient PT     Equipment Recommendations  Rolling walker with 5" wheels    Recommendations for Other Services       Precautions / Restrictions Precautions Precautions: Knee;Fall Precaution Comments: to use KI for going home and on steps- informed patient of theis    Mobility  Bed Mobility           General bed mobility comments: in chair  Transfers   Equipment used: Rolling walker (2 wheeled) Transfers: Sit to/from Stand Sit to Stand: Supervision         General transfer comment: cues for hand and right leg, although patient did not listen to instruction  Ambulation/Gait Ambulation/Gait assistance: Supervision Gait Distance (Feet): 110 Feet Assistive device: Rolling walker (2 wheeled) Gait Pattern/deviations: Step-through pattern Gait velocity: decreased   General Gait Details: cues for sequence   Stairs Stairs: Yes Stairs assistance: Min assist Stair Management: Step to pattern;Forwards;With cane;One rail Left Number of Stairs: 4 General stair comments: cues for  sequence and safety.   Wheelchair Mobility    Modified Rankin (Stroke Patients Only)       Balance                                            Cognition Arousal/Alertness: Awake/alert                                            Exercises   General Comments        Pertinent Vitals/Pain Pain Score: 3  Pain Location: right knee  Pain Descriptors / Indicators: Discomfort Pain Intervention(s): Monitored during session    Home Living                       Prior Function            PT Goals (current goals can now be found in the care plan section) Progress towards PT goals: Progressing toward goals    Frequency    7X/week      PT Plan Current plan remains appropriate    Co-evaluation              AM-PAC PT "6 Clicks" Daily Activity  Outcome Measure  Difficulty turning over in bed (including adjusting bedclothes, sheets and blankets)?: A Little Difficulty moving from lying on back to sitting on the side of the bed? : A Little Difficulty sitting down on and standing up from a chair with arms (e.g., wheelchair, bedside commode, etc,.)?: A Little Help needed moving to and from a bed to chair (including a wheelchair)?: A Little Help needed walking in hospital room?: A Little Help needed climbing 3-5 steps with a railing? : A Lot 6 Click Score: 11    End of Session Equipment Utilized During Treatment: Right knee immobilizer Activity Tolerance: Patient tolerated treatment well Patient left: in bed;with call bell/phone within reach;with bed alarm set  Nurse Communication: Mobility status PT Visit Diagnosis: Unsteadiness on feet (R26.81);Pain Pain - Right/Left: Right Pain - part of body: Knee     Time: 1610-9604 PT Time Calculation (min) (ACUTE ONLY): 13 min  Charges:  $Gait Training: 8-22 mins $Therapeutic Exercise: 8-22 mins                     Blanchard Kelch PT Acute Rehabilitation Services Pager [phone] Office [phone]    Rada Hay 03/26/2018, 11:29 AM

## 2018-03-26 NOTE — Progress Notes (Signed)
   Subjective: 2 Days Post-Op Procedure(s) (LRB): RIGHT TOTAL KNEE ARTHROPLASTY (Right) Patient reports pain as mild.   Patient seen in rounds with Dr. Lequita Halt. Patient is well, and has had no acute complaints or problems other than pain in the right knee. States he is ready to go home today. Voiding without difficulty and positive flatus. Denies chest pain, SOB or calf pain. Plan is to go Home after hospital stay.  Objective: Vital signs in last 24 hours: Temp:  [98 F (36.7 C)-98.6 F (37 C)] 98.6 F (37 C) (09/11 0520) Pulse Rate:  [51-60] 60 (09/11 0520) Resp:  [14-17] 17 (09/11 0520) BP: (149-172)/(71-92) 162/92 (09/11 0520) SpO2:  [100 %] 100 % (09/11 0520)  Intake/Output from previous day:  Intake/Output Summary (Last 24 hours) at 03/26/2018 0733 Last data filed at 03/26/2018 0600 Gross per 24 hour  Intake 950.46 ml  Output 2350 ml  Net -1399.54 ml    Labs: Recent Labs    03/25/18 0613 03/26/18 0506  HGB 12.5* 11.7*   Recent Labs    03/25/18 0613 03/26/18 0506  WBC 13.6* 10.3  RBC 4.13* 3.86*  HCT 37.6* 35.4*  PLT 162 159   Recent Labs    03/25/18 0441 03/26/18 0506  NA 138 138  K 4.1 3.1*  CL 104 100  CO2 26 29  BUN 11 13  CREATININE 1.03 1.03  GLUCOSE 251* 177*  CALCIUM 8.6* 8.4*   Exam: General - Patient is Alert and Oriented Extremity - Neurologically intact Neurovascular intact Sensation intact distally Dorsiflexion/Plantar flexion intact Dressing/Incision - clean, dry, no drainage Motor Function - intact, moving foot and toes well on exam.   Past Medical History:  Diagnosis Date  . Arthritis    osteoarthritis  . GERD (gastroesophageal reflux disease)   . Hypertension   . Nasal polyps    removed  . Peptic ulcer    hx. of  . Pre-diabetes   . Transfusion history 04-22-13   15 yrs ago-"bleeding ulcer"    Assessment/Plan: 2 Days Post-Op Procedure(s) (LRB): RIGHT TOTAL KNEE ARTHROPLASTY (Right) Active Problems:   OA  (osteoarthritis) of knee  Estimated body mass index is 28.52 kg/m as calculated from the following:   Height as of this encounter: 5\' 3"  (1.6 m).   Weight as of this encounter: 73 kg. Up with therapy D/C IV fluids  DVT Prophylaxis - Aspirin Weight-bearing as tolerated  Plan for discharge to home today after one session of therapy if meeting goals. Scheduled for outpatient physical therapy in [place] beginning Friday. Follow-up in the office in 2 weeks with Dr. Lequita Halt.  Arther Abbott, PA-C Orthopedic Surgery 03/26/2018, 7:33 AM

## 2018-03-28 ENCOUNTER — Ambulatory Visit: Payer: Medicare HMO | Admitting: Rehabilitative and Restorative Service Providers"

## 2018-03-28 ENCOUNTER — Other Ambulatory Visit: Payer: Self-pay

## 2018-03-28 ENCOUNTER — Encounter: Payer: Self-pay | Admitting: Rehabilitative and Restorative Service Providers"

## 2018-03-28 DIAGNOSIS — M6281 Muscle weakness (generalized): Secondary | ICD-10-CM | POA: Diagnosis not present

## 2018-03-28 DIAGNOSIS — M25561 Pain in right knee: Secondary | ICD-10-CM

## 2018-03-28 DIAGNOSIS — R29898 Other symptoms and signs involving the musculoskeletal system: Secondary | ICD-10-CM

## 2018-03-28 DIAGNOSIS — R2689 Other abnormalities of gait and mobility: Secondary | ICD-10-CM

## 2018-03-28 NOTE — Patient Instructions (Addendum)
Quad Sets    Slowly tighten thigh muscles of straight, left leg while counting out loud to _10___. Relax. Repeat _10___ times. Do _2-3___ sessions per day.   HIP: Flexion / KNEE: Extension, Straight Leg Raise    Raise leg, keeping knee straight.hold 3-5 sec  Perform slowly. _5-10 __ reps per set, _2-3__ sets per day  ANKLE: Pumps    Point toes down, then up. _20-30__ reps per set, _2-3__ sets per day  Ankle Circles    Slowly rotate right foot and ankle clockwise then counterclockwise. Gradually increase range of motion. Avoid pain. Circle __20-30__ times each direction per set. Do __1-2__ sets per session. Do _2-3___ sessions per day.   Self-Mobilization: Knee Flexion / Extension (Sitting)    Gently push left leg back with other leg until a stretch is felt. Hold __10-15__ seconds. Relax. Recross bent legs at ankles. Slowly straighten legs, pushing with lower leg. Hold __5-10__ seconds. Repeat __2-3__ times per day.  Self massage using a rolling pin   TENS UNIT: This is helpful for muscle pain and spasm.   Search and Purchase a TENS 7000 2nd edition at www.tenspros.com. It should be less than $30.     TENS unit instructions: Do not shower or bathe with the unit on Turn the unit off before removing electrodes or batteries If the electrodes lose stickiness add a drop of water to the electrodes after they are disconnected from the unit and place on plastic sheet. If you continued to have difficulty, call the TENS unit company to purchase more electrodes. Do not apply lotion on the skin area prior to use. Make sure the skin is clean and dry as this will help prolong the life of the electrodes. After use, always check skin for unusual red areas, rash or other skin difficulties. If there are any skin problems, does not apply electrodes to the same area. Never remove the electrodes from the unit by pulling the wires. Do not use the TENS unit or electrodes other than as  directed. Do not change electrode placement without consultating your therapist or physician. Keep 2 fingers with between each electrode.

## 2018-03-28 NOTE — Discharge Summary (Signed)
Physician Discharge Summary   Patient ID: Zachary Lyons MRN: [id number] DOB/AGE: [id number] y.o.  Admit date: 03/24/2018 Discharge date: 03/26/2018  Primary Diagnosis: Osteoarthritis, right knee   Admission Diagnoses:  Past Medical History:  Diagnosis Date  . Arthritis    osteoarthritis  . GERD (gastroesophageal reflux disease)   . Hypertension   . Nasal polyps    removed  . Peptic ulcer    hx. of  . Pre-diabetes   . Transfusion history 04-22-13   15 yrs ago-"bleeding ulcer"   Discharge Diagnoses:   Active Problems:   OA (osteoarthritis) of knee  Estimated body mass index is 28.52 kg/m as calculated from the following:   Height as of this encounter: '5\' 3"'  (1.6 m).   Weight as of this encounter: 73 kg.  Procedure:  Procedure(s) (LRB): RIGHT TOTAL KNEE ARTHROPLASTY (Right)   Consults: None  HPI: Zachary Lyons is a 72 y.o. year old male with end stage OA of his right knee with progressively worsening pain and dysfunction. He has constant pain, with activity and at rest and significant functional deficits with difficulties even with ADLs. He has had extensive non-op management including analgesics, injections of cortisone and viscosupplements, and home exercise program, but remains in significant pain with significant dysfunction. Radiographs show bone on bone arthritis medial and patellofemoral. He presents now for right Total Knee Arthroplasty.    Laboratory Data: Admission on 03/24/2018, Discharged on 03/26/2018  Component Date Value Ref Range Status  . Sodium 03/25/2018 138  135 - 145 mmol/L Final  . Potassium 03/25/2018 4.1  3.5 - 5.1 mmol/L Final  . Chloride 03/25/2018 104  98 - 111 mmol/L Final  . CO2 03/25/2018 26  22 - 32 mmol/L Final  . Glucose, Bld 03/25/2018 251* 70 - 99 mg/dL Final  . BUN 03/25/2018 11  8 - 23 mg/dL Final  . Creatinine, Ser 03/25/2018 1.03  0.61 - 1.24 mg/dL Final  . Calcium 03/25/2018 8.6* 8.9 - 10.3 mg/dL Final  . GFR calc  non Af Amer 03/25/2018 >60  >60 mL/min Final  . GFR calc Af Amer 03/25/2018 >60  >60 mL/min Final   Comment: (NOTE) The eGFR has been calculated using the CKD EPI equation. This calculation has not been validated in all clinical situations. eGFR's persistently <60 mL/min signify possible Chronic Kidney Disease.   Georgiann Hahn gap 03/25/2018 8  5 - 15 Final   Performed at [facility], [place] [address] [place], [place] [address]  . WBC 03/25/2018 13.6* 4.0 - 10.5 K/uL Final  . RBC 03/25/2018 4.13* 4.22 - 5.81 MIL/uL Final  . Hemoglobin 03/25/2018 12.5* 13.0 - 17.0 g/dL Final  . HCT 03/25/2018 37.6* 39.0 - 52.0 % Final  . MCV 03/25/2018 91.0  78.0 - 100.0 fL Final  . MCH 03/25/2018 30.3  26.0 - 34.0 pg Final  . MCHC 03/25/2018 33.2  30.0 - 36.0 g/dL Final  . RDW 03/25/2018 14.3  11.5 - 15.5 % Final  . Platelets 03/25/2018 162  150 - 400 K/uL Final   Performed at [facility], [place] [address], [place], [place] [address]  . WBC 03/26/2018 10.3  4.0 - 10.5 K/uL Final  . RBC 03/26/2018 3.86* 4.22 - 5.81 MIL/uL Final  . Hemoglobin 03/26/2018 11.7* 13.0 - 17.0 g/dL Final  . HCT 03/26/2018 35.4* 39.0 - 52.0 % Final  . MCV 03/26/2018 91.7  78.0 - 100.0 fL Final  . MCH 03/26/2018 30.3  26.0 - 34.0 pg Final  .  MCHC 03/26/2018 33.1  30.0 - 36.0 g/dL Final  . RDW 03/26/2018 14.7  11.5 - 15.5 % Final  . Platelets 03/26/2018 159  150 - 400 K/uL Final   Performed at [facility], [place] [address] [place], [place] [address]  . Sodium 03/26/2018 138  135 - 145 mmol/L Final  . Potassium 03/26/2018 3.1* 3.5 - 5.1 mmol/L Final   Comment: DELTA CHECK NOTED REPEATED TO VERIFY   . Chloride 03/26/2018 100  98 - 111 mmol/L Final  . CO2 03/26/2018 29  22 - 32 mmol/L Final  . Glucose, Bld 03/26/2018 177* 70 - 99 mg/dL Final  . BUN 03/26/2018 13  8 - 23 mg/dL Final  . Creatinine, Ser 03/26/2018 1.03  0.61 - 1.24 mg/dL Final  . Calcium 03/26/2018 8.4* 8.9 -  10.3 mg/dL Final  . GFR calc non Af Amer 03/26/2018 >60  >60 mL/min Final  . GFR calc Af Amer 03/26/2018 >60  >60 mL/min Final   Comment: (NOTE) The eGFR has been calculated using the CKD EPI equation. This calculation has not been validated in all clinical situations. eGFR's persistently <60 mL/min signify possible Chronic Kidney Disease.   Georgiann Hahn gap 03/26/2018 9  5 - 15 Final   Performed at [facility], [place] [address], [place], [place] [address]  Hospital Outpatient Visit on 03/19/2018  Component Date Value Ref Range Status  . aPTT 03/19/2018 29  24 - 36 seconds Final   Performed at [facility], [place] [address], [place], [place] [address]  . WBC 03/19/2018 6.9  4.0 - 10.5 K/uL Final  . RBC 03/19/2018 4.74  4.22 - 5.81 MIL/uL Final  . Hemoglobin 03/19/2018 14.5  13.0 - 17.0 g/dL Final  . HCT 03/19/2018 43.2  39.0 - 52.0 % Final  . MCV 03/19/2018 91.1  78.0 - 100.0 fL Final  . MCH 03/19/2018 30.6  26.0 - 34.0 pg Final  . MCHC 03/19/2018 33.6  30.0 - 36.0 g/dL Final  . RDW 03/19/2018 14.4  11.5 - 15.5 % Final  . Platelets 03/19/2018 175  150 - 400 K/uL Final   Performed at [facility], [place] [address] [place],  [address]  . Sodium 03/19/2018 141  135 - 145 mmol/L Final  . Potassium 03/19/2018 3.5  3.5 - 5.1 mmol/L Final  . Chloride 03/19/2018 103  98 - 111 mmol/L Final  . CO2 03/19/2018 28  22 - 32 mmol/L Final  . Glucose, Bld 03/19/2018 97  70 - 99 mg/dL Final  . BUN 03/19/2018 18  8 - 23 mg/dL Final  . Creatinine, Ser 03/19/2018 1.22  0.61 - 1.24 mg/dL Final  . Calcium 03/19/2018 9.3  8.9 - 10.3 mg/dL Final  . Total Protein 03/19/2018 7.6  6.5 - 8.1 g/dL Final  . Albumin 03/19/2018 4.1  3.5 - 5.0 g/dL Final  . AST 03/19/2018 26  15 - 41 U/L Final  . ALT 03/19/2018 19  0 - 44 U/L Final  . Alkaline Phosphatase 03/19/2018 108  38 - 126 U/L Final  . Total Bilirubin 03/19/2018 0.8  0.3 - 1.2 mg/dL Final  . GFR calc  non Af Amer 03/19/2018 57* >60 mL/min Final  . GFR calc Af Amer 03/19/2018 >60  >60 mL/min Final   Comment: (NOTE) The eGFR has been calculated using the CKD EPI equation. This calculation has not been validated in all clinical situations. eGFR's persistently <60 mL/min signify possible Chronic Kidney Disease.   Marland Kitchen  Anion gap 03/19/2018 10  5 - 15 Final   Performed at [facility], [place] [address], [place], [place] [address]  . Prothrombin Time 03/19/2018 12.2  11.4 - 15.2 seconds Final  . INR 03/19/2018 0.91   Final   Performed at [facility], [place] [address], [place], [place] [address]  . ABO/RH(D) 03/19/2018 O POS   Final  . Antibody Screen 03/19/2018 NEG   Final  . Sample Expiration 03/19/2018 03/27/2018   Final  . Extend sample reason 03/19/2018    Final                   Value:NO TRANSFUSIONS OR PREGNANCY IN THE PAST 3 MONTHS Performed at [facility], [place] [address], [place], [place] [address]   . MRSA, PCR 03/19/2018 NEGATIVE  NEGATIVE Final  . Staphylococcus aureus 03/19/2018 NEGATIVE  NEGATIVE Final   Comment: (NOTE) The Xpert SA Assay (FDA approved for NASAL specimens in patients 62 years of age and older), is one component of a comprehensive surveillance program. It is not intended to diagnose infection nor to guide or monitor treatment. Performed at [facility], [place] [address] [place], [place] [address]   . Hgb A1c MFr Bld 03/19/2018 6.6* 4.8 - 5.6 % Final   Comment: (NOTE) Pre diabetes:          5.7%-6.4% Diabetes:              >6.4% Glycemic control for   <7.0% adults with diabetes   . Mean Plasma Glucose 03/19/2018 142.72  mg/dL Final   Performed at [facility] [address], [place], [place] [address]     X-Rays:No results found.  EKG: Orders placed or performed in visit on 12/30/17  . EKG 12-Lead     Hospital Course: Zachary Lyons is a 72 y.o. who was admitted  to [facility]. They were brought to the operating room on 03/24/2018 and underwent Procedure(s): RIGHT TOTAL KNEE ARTHROPLASTY.  Patient tolerated the procedure well and was later transferred to the recovery room and then to the orthopaedic floor for postoperative care. They were given PO and IV analgesics for pain control following their surgery. They were given 24 hours of postoperative antibiotics of  Anti-infectives (From admission, onward)   Start     Dose/Rate Route Frequency Ordered Stop   03/24/18 1430  ceFAZolin (ANCEF) IVPB 2g/100 mL premix     2 g 200 mL/hr over 30 Minutes Intravenous Every 6 hours 03/24/18 1252 03/24/18 2109   03/24/18 0615  ceFAZolin (ANCEF) IVPB 2g/100 mL premix     2 g 200 mL/hr over 30 Minutes Intravenous On call to O.R. 03/24/18 6979 03/24/18 4801     and started on DVT prophylaxis in the form of Aspirin.   PT and OT were ordered for total joint protocol. Discharge planning consulted to help with postop disposition and equipment needs. Patient had a good night on the evening of surgery. They started to get up OOB with therapy on POD #0. Hemovac drain was pulled without difficulty on Zachary one. Continued to work with therapy into POD #2. Pt was seen during rounds on Zachary two and was ready to go home pending progress with therapy. Dressing was changed and the incision was clean, dry, and intact with no drainage. Pt worked with therapy for two additional sessions and was meeting their goals. He was discharged to home later that Zachary in stable condition.  Diet: Diabetic diet Activity: WBAT Follow-up: in 2 weeks with Dr. Wynelle Link Disposition: Home with outpatient PT in [place] Discharged Condition: stable   Discharge Instructions    Call MD / Call 911   Complete by:  As directed    If you experience chest pain or shortness of breath, CALL 911 and be transported to the hospital emergency room.  If you develope a fever above 101 F, pus (white drainage) or  increased drainage or redness at the wound, or calf pain, call your surgeon's office.   Change dressing   Complete by:  As directed    Change the dressing daily with sterile 4 x 4 inch gauze dressing and apply TED hose.   Constipation Prevention   Complete by:  As directed    Drink plenty of fluids.  Prune juice may be helpful.  You may use a stool softener, such as Colace (over the counter) 100 mg twice a Zachary.  Use MiraLax (over the counter) for constipation as needed.   Diet - low sodium heart healthy   Complete by:  As directed    Discharge instructions   Complete by:  As directed    Dr. Gaynelle Arabian Total Joint Specialist [facility] [address] [place], [place] [address] [phone]  TOTAL KNEE REPLACEMENT POSTOPERATIVE DIRECTIONS  Knee Rehabilitation, Guidelines Following Surgery  Results after knee surgery are often greatly improved when you follow the exercise, range of motion and muscle strengthening exercises prescribed by your doctor. Safety measures are also important to protect the knee from further injury. Any time any of these exercises cause you to have increased pain or swelling in your knee joint, decrease the amount until you are comfortable again and slowly increase them. If you have problems or questions, call your caregiver or physical therapist for advice.   HOME CARE INSTRUCTIONS  Remove items at home which could result in a fall. This includes throw rugs or furniture in walking pathways.  ICE to the affected knee every three hours for 30 minutes at a time and then as needed for pain and swelling.  Continue to use ice on the knee for pain and swelling from surgery. You may notice swelling that will progress down to the foot and ankle.  This is normal after surgery.  Elevate the leg when you are not up walking on it.   Continue to use the breathing machine which will help keep your temperature down.  It is common for your temperature to cycle up and  down following surgery, especially at night when you are not up moving around and exerting yourself.  The breathing machine keeps your lungs expanded and your temperature down. Do not place pillow under knee, focus on keeping the knee straight while resting   DIET You may resume your previous home diet once your are discharged from the hospital.  DRESSING / WOUND CARE / SHOWERING You may shower 3 days after surgery, but keep the wounds dry during showering.  You may use an occlusive plastic wrap (Press'n Seal for example), NO SOAKING/SUBMERGING IN THE BATHTUB.  If the bandage gets wet, change with a clean dry gauze.  If the incision gets wet, pat the wound dry with a clean towel. You may start showering once you are discharged home but do not submerge the incision under water. Just pat the incision dry and apply a dry gauze dressing on daily. Change the surgical dressing daily and reapply a dry dressing each time.  ACTIVITY Walk with your walker as instructed. Use walker as long as suggested by your caregivers. Avoid periods of inactivity such as sitting longer than an hour when not asleep. This helps prevent blood clots.  You may resume a sexual relationship in one month or when given the OK by your doctor.  You may return to work once you are cleared by your doctor.  Do not drive a car for 6 weeks or until released by you surgeon.  Do not drive while taking narcotics.  WEIGHT BEARING Weight bearing as tolerated with assist device (walker, cane, etc) as directed, use it as long as suggested by your surgeon or therapist, typically at least 4-6 weeks.  POSTOPERATIVE CONSTIPATION PROTOCOL Constipation - defined medically as fewer than three stools per week and severe constipation as less than one stool per week.  One of the most common issues patients have following surgery is constipation.  Even if you have a regular bowel pattern at home, your normal regimen is likely to be disrupted due to  multiple reasons following surgery.  Combination of anesthesia, postoperative narcotics, change in appetite and fluid intake all can affect your bowels.  In order to avoid complications following surgery, here are some recommendations in order to help you during your recovery period.  Colace (docusate) - Pick up an over-the-counter form of Colace or another stool softener and take twice a Zachary as long as you are requiring postoperative pain medications.  Take with a full glass of water daily.  If you experience loose stools or diarrhea, hold the colace until you stool forms back up.  If your symptoms do not get better within 1 week or if they get worse, check with your doctor.  Dulcolax (bisacodyl) - Pick up over-the-counter and take as directed by the product packaging as needed to assist with the movement of your bowels.  Take with a full glass of water.  Use this product as needed if not relieved by Colace only.   MiraLax (polyethylene glycol) - Pick up over-the-counter to have on hand.  MiraLax is a solution that will increase the amount of water in your bowels to assist with bowel movements.  Take as directed and can mix with a glass of water, juice, soda, coffee, or tea.  Take if you go more than two days without a movement. Do not use MiraLax more than once per Zachary. Call your doctor if you are still constipated or irregular after using this medication for 7 days in a row.  If you continue to have problems with postoperative constipation, please contact the office for further assistance and recommendations.  If you experience "the worst abdominal pain ever" or develop nausea or vomiting, please contact the office immediatly for further recommendations for treatment.  ITCHING  If you experience itching with your medications, try taking only a single pain pill, or even half a pain pill at a time.  You can also use Benadryl over the counter for itching or also to help with sleep.   TED HOSE  STOCKINGS Wear the elastic stockings on both legs for three weeks following surgery during the Zachary but you may remove then at night for sleeping.  MEDICATIONS See your medication summary on the "After Visit Summary" that the nursing staff will review with you prior to discharge.  You may have some home medications which will be placed on hold until you complete the course of blood thinner medication.  It is important for you to complete  the blood thinner medication as prescribed by your surgeon.  Continue your approved medications as instructed at time of discharge.  PRECAUTIONS If you experience chest pain or shortness of breath - call 911 immediately for transfer to the hospital emergency department.  If you develop a fever greater that 101 F, purulent drainage from wound, increased redness or drainage from wound, foul odor from the wound/dressing, or calf pain - CONTACT YOUR SURGEON.                                                   FOLLOW-UP APPOINTMENTS Make sure you keep all of your appointments after your operation with your surgeon and caregivers. You should call the office at the above phone number and make an appointment for approximately two weeks after the date of your surgery or on the date instructed by your surgeon outlined in the "After Visit Summary".   RANGE OF MOTION AND STRENGTHENING EXERCISES  Rehabilitation of the knee is important following a knee injury or an operation. After just a few days of immobilization, the muscles of the thigh which control the knee become weakened and shrink (atrophy). Knee exercises are designed to build up the tone and strength of the thigh muscles and to improve knee motion. Often times heat used for twenty to thirty minutes before working out will loosen up your tissues and help with improving the range of motion but do not use heat for the first two weeks following surgery. These exercises can be done on a training (exercise) mat, on the floor, on  a table or on a bed. Use what ever works the best and is most comfortable for you Knee exercises include:  Leg Lifts - While your knee is still immobilized in a splint or cast, you can do straight leg raises. Lift the leg to 60 degrees, hold for 3 sec, and slowly lower the leg. Repeat 10-20 times 2-3 times daily. Perform this exercise against resistance later as your knee gets better.  Quad and Hamstring Sets - Tighten up the muscle on the front of the thigh (Quad) and hold for 5-10 sec. Repeat this 10-20 times hourly. Hamstring sets are done by pushing the foot backward against an object and holding for 5-10 sec. Repeat as with quad sets.  Leg Slides: Lying on your back, slowly slide your foot toward your buttocks, bending your knee up off the floor (only go as far as is comfortable). Then slowly slide your foot back down until your leg is flat on the floor again. Angel Wings: Lying on your back spread your legs to the side as far apart as you can without causing discomfort.  A rehabilitation program following serious knee injuries can speed recovery and prevent re-injury in the future due to weakened muscles. Contact your doctor or a physical therapist for more information on knee rehabilitation.   IF YOU ARE TRANSFERRED TO A SKILLED REHAB FACILITY If the patient is transferred to a skilled rehab facility following release from the hospital, a list of the current medications will be sent to the facility for the patient to continue.  When discharged from the skilled rehab facility, please have the facility set up the patient's Maud prior to being released. Also, the skilled facility will be responsible for providing the patient with their medications at time  of release from the facility to include their pain medication, the muscle relaxants, and their blood thinner medication. If the patient is still at the rehab facility at time of the two week follow up appointment, the skilled  rehab facility will also need to assist the patient in arranging follow up appointment in our office and any transportation needs.  MAKE SURE YOU:  Understand these instructions.  Get help right away if you are not doing well or get worse.    Pick up stool softner and laxative for home use following surgery while on pain medications. Do not submerge incision under water. Please use good hand washing techniques while changing dressing each Zachary. May shower starting three days after surgery. Please use a clean towel to pat the incision dry following showers. Continue to use ice for pain and swelling after surgery. Do not use any lotions or creams on the incision until instructed by your surgeon.   Do not put a pillow under the knee. Place it under the heel.   Complete by:  As directed    Driving restrictions   Complete by:  As directed    No driving for two weeks   TED hose   Complete by:  As directed    Use stockings (TED hose) for three weeks on both leg(s).  You may remove them at night for sleeping.   Weight bearing as tolerated   Complete by:  As directed      Allergies as of 03/26/2018   No Known Allergies     Medication List    STOP taking these medications   diclofenac sodium 1 % Gel Commonly known as:  VOLTAREN   meloxicam 7.5 MG tablet Commonly known as:  MOBIC     TAKE these medications   aspirin 325 MG EC tablet Take 1 tablet (325 mg total) by mouth 2 (two) times daily for 19 days. Take one tablet (325 mg) Aspirin two times a Zachary for three weeks following surgery. Then take one baby Aspirin (81 mg) once a Zachary for three weeks. Then discontinue aspirin.   Azelastine-Fluticasone 137-50 MCG/ACT Susp One spray each nostril BID   clotrimazole-betamethasone cream Commonly known as:  LOTRISONE Apply 1 application topically 2 (two) times daily as needed (for itching).   methocarbamol 500 MG tablet Commonly known as:  ROBAXIN Take 1 tablet (500 mg total) by mouth  every 6 (six) hours as needed for muscle spasms.   metoprolol succinate 50 MG 24 hr tablet Commonly known as:  TOPROL-XL Take 3 tablets (150 mg total) by mouth daily. Take with or immediately following a meal. STOP 200 mg dose for now   omeprazole 40 MG capsule Commonly known as:  PRILOSEC TAKE 1 CAPSULE EVERY Zachary   oxyCODONE 5 MG immediate release tablet Commonly known as:  Oxy IR/ROXICODONE Take 1-2 tablets (5-10 mg total) by mouth every 6 (six) hours as needed for moderate pain (pain score 4-6).   simvastatin 10 MG tablet Commonly known as:  ZOCOR TAKE 1 TABLET AT BEDTIME   SINUS RINSE NA Place 1 Dose into the nose daily.   traMADol 50 MG tablet Commonly known as:  ULTRAM Take 1-2 tablets (50-100 mg total) by mouth every 6 (six) hours as needed for moderate pain (refractory to oxycodone).   valsartan-hydrochlorothiazide 320-25 MG tablet Commonly known as:  DIOVAN-HCT TAKE 1 TABLET EVERY Zachary            Discharge Care Instructions  (From admission, onward)  Start     Ordered   03/26/18 0000  Weight bearing as tolerated     03/26/18 0736   03/26/18 0000  Change dressing    Comments:  Change the dressing daily with sterile 4 x 4 inch gauze dressing and apply TED hose.   03/26/18 0736         Follow-up Information    Gaynelle Arabian, MD. Schedule an appointment as soon as possible for a visit on 04/08/2018.   Specialty:  Orthopedic Surgery Contact information: [address] [place] [address] [phone]           Signed: Theresa Duty, PA-C Orthopedic Surgery 03/28/2018, 8:37 AM

## 2018-03-28 NOTE — Therapy (Signed)
[facility] [place] [address] [place], [place], [address] Phone: [phone]   Fax:  [phone]  Physical Therapy Evaluation  Patient Details  Name: Zachary Lyons MRN: [id number] Date of Birth: [birth year] Referring Provider: Arther Abbott PA-C; Dr Despina Hick    Encounter Date: 03/28/2018  PT End of Session - 03/28/18 1431    Visit Number  1    Number of Visits  12    Date for PT Re-Evaluation  05/09/18    PT Start Time  1435    PT Stop Time  1542    PT Time Calculation (min)  67 min    Activity Tolerance  Patient tolerated treatment well       Past Medical History:  Diagnosis Date  . Arthritis    osteoarthritis  . GERD (gastroesophageal reflux disease)   . Hypertension   . Nasal polyps    removed  . Peptic ulcer    hx. of  . Pre-diabetes   . Transfusion history 04-22-13   15 yrs ago-"bleeding ulcer"    Past Surgical History:  Procedure Laterality Date  . CATARACT EXTRACTION Left 04-22-13  . COLONOSCOPY    . JOINT REPLACEMENT     Right total knee arthroplasty  . NASAL SINUS SURGERY     polyps revoved 08-2017  . STOMACH SURGERY     oversew of bleeding ulcer  . TOTAL KNEE ARTHROPLASTY Left 04/27/2013   Procedure: LEFT TOTAL KNEE ARTHROPLASTY;  Surgeon: Loanne Drilling, MD;  Location: [facility];  Service: Orthopedics;  Laterality: Left;  . TOTAL KNEE ARTHROPLASTY Right 03/24/2018   Procedure: RIGHT TOTAL KNEE ARTHROPLASTY;  Surgeon: Ollen Gross, MD;  Location: [facility];  Service: Orthopedics;  Laterality: Right;    There were no vitals filed for this visit.   Subjective Assessment - 03/28/18 1344    Subjective  Patient has had Rt knee pain for several years with increase in symptoms in the past year. he underwent Rt TKA 03/24/18    Pertinent History  Lt TKA 2014; pre diabetic; HTN    Diagnostic tests  xray     Patient Stated Goals  use leg normally again     Currently in Pain?  Yes    Pain Score  5     Pain Location  Knee    Pain Orientation  Right    Pain Descriptors / Indicators  Aching    Pain Type  Surgical pain;Chronic pain    Pain Onset  In the past 7 days    Pain Frequency  Intermittent    Aggravating Factors   movement; walking    Pain Relieving Factors  pain meds; ice          [facility] PT Assessment - 03/28/18 0001      Assessment   Medical Diagnosis  Rt TKA    Referring Provider  Arther Abbott PA-C; Dr Despina Hick     Onset Date/Surgical Date  03/24/18    Hand Dominance  Right    Next MD Visit  04/08/18    Prior Therapy  in hospital       Precautions   Precautions  None      Restrictions   Weight Bearing Restrictions  No      Balance Screen   Has the patient fallen in the past 6 months  No    Has the patient had a decrease in activity level because of a fear of falling?   No    Is  the patient reluctant to leave their home because of a fear of falling?   No      Home Environment   Additional Comments  multilevel - 4 stairs with railing; bedroom on 1st level       Prior Function   Level of Independence  Independent    Vocation  Retired    Restaurant manager, fast food - retired 5 yrs ago     Leisure  garage working on cars; cleaning up; yard work       Observation/Other Assessments   Skin Integrity  --   incision dry/covered with bandage    Focus on Therapeutic Outcomes (FOTO)   74% limitaiton       Observation/Other Assessments-Edema    Edema  --   edema Rt knee      Sensation   Additional Comments  WNL's per pt report       Posture/Postural Control   Posture Comments  flexed forward posture; wt shifted to the Lt in standing       AROM   Right/Left Hip  --   tight end ranges Rt - WFL's Lt    Right Knee Extension  -13    Right Knee Flexion  70    Left Knee Extension  0    Left Knee Flexion  120    Right/Left Ankle  --   WFL's bilat      Strength   Right/Left Hip  --   Lt WFL's    Left Knee Flexion  5/5    Left Knee Extension  5/5     Right/Left Ankle  --   WFL's bilat      Ambulation/Gait   Ambulation/Gait  No    Ambulation Distance (Feet)  50 Feet    Assistive device  Rolling walker    Gait Pattern  Decreased step length - right;Decreased stance time - right;Decreased hip/knee flexion - right;Decreased weight shift to right    Ambulation Surface  Level    Gait velocity  slowed gait     Stairs  Yes                Objective measurements completed on examination: See above findings.      [facility] Adult PT Treatment/Exercise - 03/28/18 0001      Self-Care   Self-Care  --   self massage with rolling pin      Therapeutic Activites    Therapeutic Activities  --   instructed in ascending/descending stairs 2 stepsx3 trials      Knee/Hip Exercises: Seated   Knee/Hip Flexion  seated knee flexion 10 sec hold x 5       Knee/Hip Exercises: Supine   Quad Sets  AROM;Strengthening;Right;10 reps   5 sec hold    Straight Leg Raises  AROM;Strengthening;Right;5 reps   10-12 inches 3 sec hold      Electrical Stimulation   Electrical Stimulation Location  Rt knee     Electrical Stimulation Action  ion repelling    Electrical Stimulation Parameters  to tolerance    Electrical Stimulation Goals  Pain;Edema      Vasopneumatic   Number Minutes Vasopneumatic   15 minutes    Vasopnuematic Location   Knee    Vasopneumatic Pressure  Low    Vasopneumatic Temperature   34 deg              PT Education - 03/28/18 1420    Education Details  HEP TENs stairs  Person(s) Educated  Patient;Spouse    Methods  Explanation;Demonstration;Tactile cues;Verbal cues;Handout    Comprehension  Verbalized understanding;Returned demonstration;Verbal cues required;Tactile cues required          PT Long Term Goals - 03/28/18 1436      PT LONG TERM GOAL #1   Title  Independent in ambulation including stirs with least restrictive assistive device to no assistive device  05/09/18    Time  6    Period  Weeks     Status  New      PT LONG TERM GOAL #2   Title  Rt knee ROm 0 deg extension to 115 deg flexion 05/09/18    Time  6    Period  Weeks    Status  New      PT LONG TERM GOAL #3   Title  4+/5 to 5/5 strength Rt LE 05/09/18    Time  6    Period  Weeks      PT LONG TERM GOAL #4   Title  Independent in HEP 05/09/18    Time  6    Period  Weeks    Status  New      PT LONG TERM GOAL #5   Title  Imrprove FOTO to </= 53% limitation 05/09/18    Time  6    Period  Weeks    Status  New             Plan - 03/28/18 1433    Clinical Impression Statement  Patient presents s/p Rt TKA 03/24/18 with continued pain; limited ROM; decreased strength; abnormal gait pattern; decreased functional activity level and pain Rt knee. He will benefit from PT to address problems identified.     History and Personal Factors relevant to plan of care:  Lt TKA 5 yrs ago     Clinical Presentation  Evolving    Clinical Decision Making  Low    Rehab Potential  Good    PT Frequency  2x / week    PT Duration  6 weeks    PT Treatment/Interventions  Patient/family education;ADLs/Self Care Home Management;Cryotherapy;Electrical Stimulation;Iontophoresis 4mg /ml Dexamethasone;Moist Heat;Ultrasound;Dry needling;Manual techniques;Neuromuscular re-education;Gait training;Stair training;Functional mobility training;Therapeutic activities;Therapeutic exercise;Taping    PT Next Visit Plan  review HEP; progress with ROM and strengthening; gait training; stair training review as indicated; modalities as indicated     Consulted and Agree with Plan of Care  Patient;Family member/caregiver    Family Member Consulted  wife        Patient will benefit from skilled therapeutic intervention in order to improve the following deficits and impairments:  Pain, Increased fascial restricitons, Increased muscle spasms, Decreased mobility, Decreased range of motion, Decreased strength, Abnormal gait, Decreased activity tolerance, Decreased  balance  Visit Diagnosis: Acute pain of right knee - Plan: PT plan of care cert/re-cert  Other symptoms and signs involving the musculoskeletal system - Plan: PT plan of care cert/re-cert  Muscle weakness (generalized) - Plan: PT plan of care cert/re-cert  Other abnormalities of gait and mobility - Plan: PT plan of care cert/re-cert     Problem List Patient Active Problem List   Diagnosis Date Noted  . Nasal polyp 07/02/2017  . Thrombocytopenia (HCC) 04/24/2016  . Elevated LFTs 02/17/2015  . Cerumen impaction 04/23/2014  . Prediabetes 04/20/2014  . Essential hypertension, benign 04/08/2014  . Hyperlipidemia 04/08/2014  . Habitual alcohol use 04/08/2014  . OA (osteoarthritis) of knee 04/27/2013    Kayin Osment Rober Minion PT, MPH  03/28/2018, 2:44 PM  [facility] [address] [place], [place], [address] Phone: [phone]   Fax:  [phone]  Name: Zachary Lyons MRN: [id number] Date of Birth: 08/29/1945

## 2018-03-28 NOTE — Patient Outreach (Signed)
[facility]) Care Management  03/28/2018  Zachary Lyons 07/13/1946 [id number]     Transition of Care Referral  Referral Date: 03/28/18  Referral Source: [employer] Discharge Report Date of Admission: 03/24/18 Diagnosis: "osteoarthritis" Date of Discharge: 03/26/18 [facility] Insurance: [facility] [employer]    Outreach attempt # 1 to patient. Spoke with patient briefly as he could not talk. Patient states that he is headed to his therapy appt. Spouse is very supportive and assisting him as needed. He voices that he has all his meds. His spouse is helping him manage his meds. He states that his pain is controlled. Spouse inquired if it ws okay to pre-medicate patient prior to going got therapy. RN CM spoke with them on pain mgmt and benefits of premedicating prior to extensive activity. They voiced understanding. Patient denies any RN CM or [facility] needs or concerns at this time. He was appreciative of follow up call.     Plan: RN CM will close case at this time.    Antionette Fairyoshanda Lashaun Krapf, RN,BSN,CCM [facility] Care [facility] Telephonic Care Management Coordinator Direct Phone: [phone] Toll Free: [phone] Fax: [phone]

## 2018-04-01 ENCOUNTER — Encounter: Payer: Self-pay | Admitting: Rehabilitative and Restorative Service Providers"

## 2018-04-01 ENCOUNTER — Ambulatory Visit: Payer: Medicare HMO | Admitting: Rehabilitative and Restorative Service Providers"

## 2018-04-01 DIAGNOSIS — R29898 Other symptoms and signs involving the musculoskeletal system: Secondary | ICD-10-CM

## 2018-04-01 DIAGNOSIS — R2689 Other abnormalities of gait and mobility: Secondary | ICD-10-CM | POA: Diagnosis not present

## 2018-04-01 DIAGNOSIS — M6281 Muscle weakness (generalized): Secondary | ICD-10-CM | POA: Diagnosis not present

## 2018-04-01 DIAGNOSIS — M25561 Pain in right knee: Secondary | ICD-10-CM | POA: Diagnosis not present

## 2018-04-01 NOTE — Therapy (Signed)
[facility] [place] [address] [place], [place], [address] Phone: [phone]   Fax:  [phone]  Physical Therapy Treatment  Patient Details  Name: Zachary Lyons MRN: [id number] Date of Birth: 01/14/1946 Referring Provider: Arther Abbott PA-C; Dr Despina Hick    Encounter Date: 04/01/2018  PT End of Session - 04/01/18 1245    Visit Number  2    Number of Visits  12    Date for PT Re-Evaluation  05/09/18    PT Start Time  1145    PT Stop Time  1243    PT Time Calculation (min)  58 min    Activity Tolerance  Patient tolerated treatment well       Past Medical History:  Diagnosis Date  . Arthritis    osteoarthritis  . GERD (gastroesophageal reflux disease)   . Hypertension   . Nasal polyps    removed  . Peptic ulcer    hx. of  . Pre-diabetes   . Transfusion history 04-22-13   15 yrs ago-"bleeding ulcer"    Past Surgical History:  Procedure Laterality Date  . CATARACT EXTRACTION Left 04-22-13  . COLONOSCOPY    . JOINT REPLACEMENT     Right total knee arthroplasty  . NASAL SINUS SURGERY     polyps revoved 08-2017  . STOMACH SURGERY     oversew of bleeding ulcer  . TOTAL KNEE ARTHROPLASTY Left 04/27/2013   Procedure: LEFT TOTAL KNEE ARTHROPLASTY;  Surgeon: Loanne Drilling, MD;  Location: [facility];  Service: Orthopedics;  Laterality: Left;  . TOTAL KNEE ARTHROPLASTY Right 03/24/2018   Procedure: RIGHT TOTAL KNEE ARTHROPLASTY;  Surgeon: Ollen Gross, MD;  Location: [facility];  Service: Orthopedics;  Laterality: Right;    There were no vitals filed for this visit.  Subjective Assessment - 04/01/18 1156    Subjective  Knee continues to improve - a little better every day. he is able to lift leg in and out of his bed easier and can get in and out of his garage/up and down the stairs better now. Wife has a stationary bike at home.     Currently in Pain?  Yes    Pain Score  3     Pain Location  Knee    Pain  Orientation  Right    Pain Descriptors / Indicators  Aching         [facility] PT Assessment - 04/01/18 0001      Assessment   Medical Diagnosis  Rt TKA    Onset Date/Surgical Date  03/24/18    Hand Dominance  Right    Next MD Visit  04/08/18    Prior Therapy  in hospital       AROM   Right Knee Extension  -10   post PT assist for passive extension stretch - heel propped   Right Knee Flexion  88   supine pt assisting with strap                   [facility] Adult PT Treatment/Exercise - 04/01/18 0001      Ambulation/Gait   Ambulation Distance (Feet)  80 Feet    Assistive device  Straight cane    Ambulation Surface  Level    Pre-Gait Activities  standing wt shift     Gait Comments  working on gait pattern with walker and SBA to CGA for safety       Knee/Hip Exercises: Ambulance person  reps;30 seconds   prone with strap    Knee: Self-Stretch to increase Flexion  Right;10 seconds   10 reps rolling heels on orange therapy ball      Knee/Hip Exercises: Aerobic   Recumbent Bike  for ROM only ~ 2-3 min (instruction for pt to try stationary bike for home for ROM)     Nustep  L4 x 6 min for ROM       Knee/Hip Exercises: Seated   Knee/Hip Flexion  seated knee flexion 10 sec hold x 5       Knee/Hip Exercises: Supine   Quad Sets  AROM;Strengthening;Right;10 reps   5 sec hold    Straight Leg Raises  AROM;Strengthening;Right;5 reps   10-12 inches 5 sec hold      Electrical Stimulation   Electrical Stimulation Location  Rt knee     Electrical Stimulation Action  ion repelling    Electrical Stimulation Parameters  to tolerance    Electrical Stimulation Goals  Pain;Edema      Vasopneumatic   Number Minutes Vasopneumatic   15 minutes    Vasopnuematic Location   Knee    Vasopneumatic Pressure  Low    Vasopneumatic Temperature   34 deg       Manual Therapy   Manual therapy comments  pt prone for assisted knee flexion; supne for extension     Passive ROM   knee flexion with PT asssist 10 sec x 3 reps; assisted knee extensin 10 sec x 3 reps heel resting on foam roll                   PT Long Term Goals - 03/28/18 1436      PT LONG TERM GOAL #1   Title  Independent in ambulation including stirs with least restrictive assistive device to no assistive device  05/09/18    Time  6    Period  Weeks    Status  New      PT LONG TERM GOAL #2   Title  Rt knee ROm 0 deg extension to 115 deg flexion 05/09/18    Time  6    Period  Weeks    Status  New      PT LONG TERM GOAL #3   Title  4+/5 to 5/5 strength Rt LE 05/09/18    Time  6    Period  Weeks      PT LONG TERM GOAL #4   Title  Independent in HEP 05/09/18    Time  6    Period  Weeks    Status  New      PT LONG TERM GOAL #5   Title  Imrprove FOTO to </= 53% limitation 05/09/18    Time  6    Period  Weeks    Status  New            Plan - 04/01/18 1153    Clinical Impression Statement  Patient and wife report daily improvement. patient is anxious to move from the walker to cane or no assistive device. Worked on gait in clinic today with SPC. Good gains in ROM noted. Progressing well with gait training and remainder of knee rehab.    Rehab Potential  Good    PT Frequency  2x / week    PT Duration  6 weeks    PT Treatment/Interventions  Patient/family education;ADLs/Self Care Home Management;Cryotherapy;Electrical Stimulation;Iontophoresis 4mg /ml Dexamethasone;Moist Heat;Ultrasound;Dry needling;Manual techniques;Neuromuscular re-education;Gait training;Stair training;Functional mobility training;Therapeutic activities;Therapeutic  exercise;Taping    PT Next Visit Plan  review HEP; progress with ROM and strengthening; gait training; stair training review as indicated; modalities as indicated     Consulted and Agree with Plan of Care  Patient;Family member/caregiver    Family Member Consulted  wife        Patient will benefit from skilled therapeutic intervention in  order to improve the following deficits and impairments:  Pain, Increased fascial restricitons, Increased muscle spasms, Decreased mobility, Decreased range of motion, Decreased strength, Abnormal gait, Decreased activity tolerance, Decreased balance  Visit Diagnosis: Acute pain of right knee  Other symptoms and signs involving the musculoskeletal system  Muscle weakness (generalized)  Other abnormalities of gait and mobility     Problem List Patient Active Problem List   Diagnosis Date Noted  . Nasal polyp 07/02/2017  . Thrombocytopenia (HCC) 04/24/2016  . Elevated LFTs 02/17/2015  . Cerumen impaction 04/23/2014  . Prediabetes 04/20/2014  . Essential hypertension, benign 04/08/2014  . Hyperlipidemia 04/08/2014  . Habitual alcohol use 04/08/2014  . OA (osteoarthritis) of knee 04/27/2013    Zonie Crutcher Rober MinionP Zada Haser PT, MPH  04/01/2018, 12:46 PM  [facility] [address] [place], [place], [address] Phone: [phone]   Fax:  [phone]  Name: Katheren ShamsJesus Romero-Martinez MRN: [id number] Date of Birth: 05/16/1946

## 2018-04-03 ENCOUNTER — Ambulatory Visit: Payer: Medicare HMO | Admitting: Physical Therapy

## 2018-04-03 ENCOUNTER — Encounter: Payer: Self-pay | Admitting: Physical Therapy

## 2018-04-03 VITALS — BP 108/68 | HR 55

## 2018-04-03 DIAGNOSIS — R29898 Other symptoms and signs involving the musculoskeletal system: Secondary | ICD-10-CM

## 2018-04-03 DIAGNOSIS — M25561 Pain in right knee: Secondary | ICD-10-CM

## 2018-04-03 DIAGNOSIS — M6281 Muscle weakness (generalized): Secondary | ICD-10-CM

## 2018-04-03 NOTE — Therapy (Signed)
[facility] [address] [place], [place], [address] Phone: [phone]   Fax:  [phone]  Physical Therapy Treatment  Patient Details  Name: Zachary ShamsJesus Lyons MRN: [id number] Date of Birth: 08/05/1945 Referring Provider: Arther AbbottKristie Edmisten PA-C; Dr Despina HickAlusio    Encounter Date: 04/03/2018  PT End of Session - 04/03/18 1527    Visit Number  3    Number of Visits  12    Date for PT Re-Evaluation  05/09/18    PT Start Time  1447    PT Stop Time  1543    PT Time Calculation (min)  56 min    Activity Tolerance  Patient tolerated treatment well    Behavior During Therapy  [facility] for tasks assessed/performed       Past Medical History:  Diagnosis Date  . Arthritis    osteoarthritis  . GERD (gastroesophageal reflux disease)   . Hypertension   . Nasal polyps    removed  . Peptic ulcer    hx. of  . Pre-diabetes   . Transfusion history 04-22-13   15 yrs ago-"bleeding ulcer"    Past Surgical History:  Procedure Laterality Date  . CATARACT EXTRACTION Left 04-22-13  . COLONOSCOPY    . JOINT REPLACEMENT     Right total knee arthroplasty  . NASAL SINUS SURGERY     polyps revoved 08-2017  . STOMACH SURGERY     oversew of bleeding ulcer  . TOTAL KNEE ARTHROPLASTY Left 04/27/2013   Procedure: LEFT TOTAL KNEE ARTHROPLASTY;  Surgeon: Loanne DrillingFrank V Aluisio, MD;  Location: [facility];  Service: Orthopedics;  Laterality: Left;  . TOTAL KNEE ARTHROPLASTY Right 03/24/2018   Procedure: RIGHT TOTAL KNEE ARTHROPLASTY;  Surgeon: Ollen GrossAluisio, Frank, MD;  Location: [facility];  Service: Orthopedics;  Laterality: Right;    Vitals:   04/03/18 1505  BP: 108/68  Pulse: (!) 55    Subjective Assessment - 04/03/18 1724    Subjective  Pt reports he just woke up from nap. He has felt dizzy at times during day when at home.  Pt's wife states, "he kicked his walker to the curb" and reports he is walking around house with cane.  Pt arrives to therapy ambulating  with SPC in Rt hand.  Pt's wife states that he wanted to use the riding lawn mower, but convinced him to hold off until cleared by MD.     Pertinent History  Lt TKA 2014; pre diabetic; HTN    Patient Stated Goals  use leg normally again     Currently in Pain?  Yes    Pain Score  3     Pain Location  Knee    Pain Orientation  Right    Pain Descriptors / Indicators  Aching    Aggravating Factors   bending knee to far    Pain Relieving Factors  pain meds; ice.          [facility] PT Assessment - 04/03/18 0001      AROM   Right Knee Flexion  92       [facility] Adult PT Treatment/Exercise - 04/03/18 0001      Exercises   Exercises  Knee/Hip      Knee/Hip Exercises: Stretches   Passive Hamstring Stretch  Right;4 reps;30 seconds   seated, straight back - unable to follow directions; switched to supine position with improved performance.    Quad Stretch  Right;3 reps;30 seconds   prone with strap  Knee/Hip Exercises: Aerobic   Recumbent Bike  partial revolutions for Rt knee ROM x 7 min       Knee/Hip Exercises: Standing   Forward Step Up  Right;1 set;5 reps;Hand Hold: 2;Step Height: 2"    Step Down  Right;1 set;Hand Hold: 2;Step Height: 2"   3 steps   Gait Training  80 ft with SPC.  (height of SPC adjusted prior to start of gait, as well as instruction for cane in Lt hand vs Rt).  VC for step through pattern; poor carryover - switched to step to pattern, with cues including demo for cane placement, step length.        Knee/Hip Exercises: Seated   Knee/Hip Flexion  seated scoot x 10 reps       Vasopneumatic   Number Minutes Vasopneumatic   15 minutes    Vasopnuematic Location   Knee   Rt   Vasopneumatic Pressure  Low    Vasopneumatic Temperature   34 deg                   PT Long Term Goals - 03/28/18 1436      PT LONG TERM GOAL #1   Title  Independent in ambulation including stirs with least restrictive assistive device to no assistive device  05/09/18    Time  6     Period  Weeks    Status  New      PT LONG TERM GOAL #2   Title  Rt knee ROm 0 deg extension to 115 deg flexion 05/09/18    Time  6    Period  Weeks    Status  New      PT LONG TERM GOAL #3   Title  4+/5 to 5/5 strength Rt LE 05/09/18    Time  6    Period  Weeks      PT LONG TERM GOAL #4   Title  Independent in HEP 05/09/18    Time  6    Period  Weeks    Status  New      PT LONG TERM GOAL #5   Title  Imrprove FOTO to </= 53% limitation 05/09/18    Time  6    Period  Weeks    Status  New            Plan - 04/03/18 1507    Clinical Impression Statement  Pt reported light headedness after ambulating to mat after bicycle.   BP measured; educated pt and his wife on BP in relation to activity/ safety/ adverse effect from medications.  Encouraged pt and wife to contact MD regarding medication management.  Pt had difficulty following commands at times; required repeated tactile cues and demonstratiion for improved form.  Pt progressing well towards goals.     Rehab Potential  Good    PT Frequency  2x / week    PT Duration  6 weeks    PT Treatment/Interventions  Patient/family education;ADLs/Self Care Home Management;Cryotherapy;Electrical Stimulation;Iontophoresis 4mg /ml Dexamethasone;Moist Heat;Ultrasound;Dry needling;Manual techniques;Neuromuscular re-education;Gait training;Stair training;Functional mobility training;Therapeutic activities;Therapeutic exercise;Taping    PT Next Visit Plan  continued progressive ROM and strengthening of Rt knee; gait and stair training.        Patient will benefit from skilled therapeutic intervention in order to improve the following deficits and impairments:  Pain, Increased fascial restricitons, Increased muscle spasms, Decreased mobility, Decreased range of motion, Decreased strength, Abnormal gait, Decreased activity tolerance, Decreased balance  Visit Diagnosis: Acute  pain of right knee  Other symptoms and signs involving the  musculoskeletal system  Muscle weakness (generalized)     Problem List Patient Active Problem List   Diagnosis Date Noted  . Nasal polyp 07/02/2017  . Thrombocytopenia (HCC) 04/24/2016  . Elevated LFTs 02/17/2015  . Cerumen impaction 04/23/2014  . Prediabetes 04/20/2014  . Essential hypertension, benign 04/08/2014  . Hyperlipidemia 04/08/2014  . Habitual alcohol use 04/08/2014  . OA (osteoarthritis) of knee 04/27/2013   Mayer Camel, PTA 04/03/18 5:27 PM  [facility] [address] [place], [place], [address] Phone: [phone]   Fax:  [phone]  Name: Jarmarcus Wambold MRN: [id number] Date of Birth: 07/02/1946

## 2018-04-07 ENCOUNTER — Ambulatory Visit (INDEPENDENT_AMBULATORY_CARE_PROVIDER_SITE_OTHER): Payer: Medicare HMO | Admitting: Physical Therapy

## 2018-04-07 DIAGNOSIS — M25561 Pain in right knee: Secondary | ICD-10-CM | POA: Diagnosis not present

## 2018-04-07 DIAGNOSIS — R2689 Other abnormalities of gait and mobility: Secondary | ICD-10-CM | POA: Diagnosis not present

## 2018-04-07 DIAGNOSIS — R29898 Other symptoms and signs involving the musculoskeletal system: Secondary | ICD-10-CM

## 2018-04-07 DIAGNOSIS — M6281 Muscle weakness (generalized): Secondary | ICD-10-CM | POA: Diagnosis not present

## 2018-04-07 NOTE — Patient Instructions (Addendum)
SIT TO STAND: No Device    Sit with feet shoulder-width apart, on floor. Lean chest forward, raise hips up from surface. Straighten hips and knees. Weight bear equally on left and right sides. _5-10__ reps per set, _2__ sets per day, _4__ days per week.  Sitting Scoot    Scoot to the front of chair, then scoot back. Repeat __10__ times. Do __2__ sessions per day.  Anterior Step-Up    Step up on _3-6__ inch step with right foot. Step down with Left foot. Repeat  __10_ times.   [facility] [address] [place], [place] [address]  [phone] (office) [phone] (fax)

## 2018-04-07 NOTE — Therapy (Signed)
[facility] [place] [address] [place] [address] [place], [place], [address] Phone: [phone]   Fax:  [phone]  Physical Therapy Treatment  Patient Details  Name: Zachary Lyons MRN: [id number] Date of Birth: 08/03/1945 Referring Provider: Arther Abbott PA-C; Dr Despina Hick    Encounter Date: 04/07/2018  PT End of Session - 04/07/18 1306    Visit Number  4    Number of Visits  12    Date for PT Re-Evaluation  05/09/18    PT Start Time  1149    PT Stop Time  1245    PT Time Calculation (min)  56 min    Activity Tolerance  Patient tolerated treatment well    Behavior During Therapy  [facility] for tasks assessed/performed       Past Medical History:  Diagnosis Date  . Arthritis    osteoarthritis  . GERD (gastroesophageal reflux disease)   . Hypertension   . Nasal polyps    removed  . Peptic ulcer    hx. of  . Pre-diabetes   . Transfusion history 04-22-13   15 yrs ago-"bleeding ulcer"    Past Surgical History:  Procedure Laterality Date  . CATARACT EXTRACTION Left 04-22-13  . COLONOSCOPY    . JOINT REPLACEMENT     Right total knee arthroplasty  . NASAL SINUS SURGERY     polyps revoved 08-2017  . STOMACH SURGERY     oversew of bleeding ulcer  . TOTAL KNEE ARTHROPLASTY Left 04/27/2013   Procedure: LEFT TOTAL KNEE ARTHROPLASTY;  Surgeon: Loanne Drilling, MD;  Location: [facility];  Service: Orthopedics;  Laterality: Left;  . TOTAL KNEE ARTHROPLASTY Right 03/24/2018   Procedure: RIGHT TOTAL KNEE ARTHROPLASTY;  Surgeon: Ollen Gross, MD;  Location: [facility];  Service: Orthopedics;  Laterality: Right;    There were no vitals filed for this visit.  Subjective Assessment - 04/07/18 1212    Subjective  Per pt's wife, pt has been in more pain lately.  With more pain, pt is not getting up and moving as much.  She has some medication questions she plans to ask MD tomorrow.     Patient is accompained by:  Family member   pt's wife, [name]   Currently in Pain?  Yes    Pain Score  4     Pain Location  Knee    Pain Orientation  Right    Pain Descriptors / Indicators  Aching    Aggravating Factors   ?    Pain Relieving Factors  ice         [facility] PT Assessment - 04/07/18 0001      Assessment   Medical Diagnosis  Rt TKA    Referring Provider  Arther Abbott PA-C; Dr Despina Hick     Onset Date/Surgical Date  03/24/18    Hand Dominance  Right    Next MD Visit  04/08/18      AROM   Right Knee Extension  -6    Right Knee Flexion  100      Ambulation/Gait   Assistive device  4-wheeled walker    Gait Comments  80 ft with SPC, VC for step through pattern, increased knee flexion during swing through and to decreased Rt step length (poor gait pattern - switched to RW with improved pattern)          [facility] Adult PT Treatment/Exercise - 04/07/18 0001      Ambulation/Gait   Ambulation Distance (Feet)  160 Feet  withRW -  cues for Rt step length, Rt heel strike and increased knee flexion during swing through.      Exercises   Exercises  Knee/Hip      Knee/Hip Exercises: Stretches   Passive Hamstring Stretch  Right;2 reps;30 seconds   supine with strap     Knee/Hip Exercises: Aerobic   Nustep  L4 (up to 70 SPM, arms/legs) x 6 min for ROM    PTA present to discuss progress and monitor     Knee/Hip Exercises: Standing   Forward Step Up  Right;1 set;15 reps;Hand Hold: 2;Step Height: 2"    Step Down  Right;1 set;Hand Hold: 2;Step Height: 6";5 reps      Knee/Hip Exercises: Seated   Knee/Hip Flexion  seated scoot x 10 reps (repeated cues for technique)    Sit to Sand  5 reps;without UE support   low mat table, 2 stes     Knee/Hip Exercises: Supine   Quad Sets  Strengthening;Right;1 set;10 reps   5 sec holds   Bridges  10 reps   Heel slide with strap assist x 4 reps      Vasopneumatic   Number Minutes Vasopneumatic   15 minutes    Vasopnuematic Location   Knee   Rt   Vasopneumatic Pressure  Low    Vasopneumatic  Temperature   34 deg              PT Education - 04/07/18 1239    Education Details  HEP     Person(s) Educated  Patient;Spouse    Methods  Explanation;Handout;Verbal cues;Tactile cues;Demonstration    Comprehension  Verbalized understanding;Returned demonstration          PT Long Term Goals - 04/07/18 1251      PT LONG TERM GOAL #1   Title  Independent in ambulation including stairs with least restrictive assistive device to no assistive device  05/09/18    Time  6    Period  Weeks    Status  On-going      PT LONG TERM GOAL #2   Title  Rt knee ROm 0 deg extension to 115 deg flexion 05/09/18    Time  6    Period  Weeks    Status  On-going      PT LONG TERM GOAL #3   Title  4+/5 to 5/5 strength Rt LE 05/09/18    Time  6    Period  Weeks    Status  On-going      PT LONG TERM GOAL #4   Title  Independent in HEP 05/09/18    Time  6    Period  Weeks    Status  On-going      PT LONG TERM GOAL #5   Title  Imrprove FOTO to </= 53% limitation 05/09/18    Time  6    Period  Weeks    Status  On-going            Plan - 04/07/18 1218    Clinical Impression Statement  Pt ambulates into therapy with SPC, with step-to pattern with guarded, stiff RLE.  After warm up on NuStep, pt's Rt knee ROM had improved.  Pt able to demonstrate gait with RW with improved step through gait pattern with less guarding and improved knee flexion.   Pt requires freq cues to stay on task and for counting.  Pt tolerated all exercses well, reporting decrease in pain after completion.  Further decrease in pain reported  after vaso at end of treatment.  Pt will benefit from continued PT intervention to max functional mobility.     Rehab Potential  Good    PT Frequency  2x / week    PT Duration  6 weeks    PT Treatment/Interventions  Patient/family education;ADLs/Self Care Home Management;Cryotherapy;Electrical Stimulation;Iontophoresis 4mg /ml Dexamethasone;Moist Heat;Ultrasound;Dry  needling;Manual techniques;Neuromuscular re-education;Gait training;Stair training;Functional mobility training;Therapeutic activities;Therapeutic exercise;Taping    PT Next Visit Plan  continued progressive ROM and strengthening of Rt knee; gait and stair training.     Consulted and Agree with Plan of Care  Family member/caregiver    Family Member Consulted  wife        Patient will benefit from skilled therapeutic intervention in order to improve the following deficits and impairments:  Pain, Increased fascial restricitons, Increased muscle spasms, Decreased mobility, Decreased range of motion, Decreased strength, Abnormal gait, Decreased activity tolerance, Decreased balance  Visit Diagnosis: Acute pain of right knee  Other symptoms and signs involving the musculoskeletal system  Muscle weakness (generalized)  Other abnormalities of gait and mobility     Problem List Patient Active Problem List   Diagnosis Date Noted  . Nasal polyp 07/02/2017  . Thrombocytopenia (HCC) 04/24/2016  . Elevated LFTs 02/17/2015  . Cerumen impaction 04/23/2014  . Prediabetes 04/20/2014  . Essential hypertension, benign 04/08/2014  . Hyperlipidemia 04/08/2014  . Habitual alcohol use 04/08/2014  . OA (osteoarthritis) of knee 04/27/2013   Mayer Camel, PTA 04/07/18 1:09 PM  [facility] [place] [address] [place] [address] [place], [place], [address] Phone: [phone]   Fax:  [phone]  Name: Yahsir Wickens MRN: [id number] Date of Birth: 04/22/1946

## 2018-04-10 ENCOUNTER — Encounter: Payer: Self-pay | Admitting: Rehabilitative and Restorative Service Providers"

## 2018-04-10 ENCOUNTER — Ambulatory Visit: Payer: Medicare HMO | Admitting: Rehabilitative and Restorative Service Providers"

## 2018-04-10 DIAGNOSIS — M25561 Pain in right knee: Secondary | ICD-10-CM

## 2018-04-10 DIAGNOSIS — R2689 Other abnormalities of gait and mobility: Secondary | ICD-10-CM | POA: Diagnosis not present

## 2018-04-10 DIAGNOSIS — R29898 Other symptoms and signs involving the musculoskeletal system: Secondary | ICD-10-CM

## 2018-04-10 DIAGNOSIS — M6281 Muscle weakness (generalized): Secondary | ICD-10-CM | POA: Diagnosis not present

## 2018-04-10 NOTE — Therapy (Signed)
[facility] [place] [address] [place] [address] [place], [place], [address] Phone: [phone]   Fax:  [phone]  Physical Therapy Treatment  Patient Details  Name: Zachary [place] MRN: [id number] Date of Birth: November 03, 1945 Referring Provider (PT): Arther Abbott PA-C; Dr Despina Hick    Encounter Date: 04/10/2018  PT End of Session - 04/10/18 1403    Visit Number  5    Number of Visits  12    Date for PT Re-Evaluation  05/09/18    PT Start Time  1402    PT Stop Time  1459    PT Time Calculation (min)  57 min    Activity Tolerance  Patient tolerated treatment well       Past Medical History:  Diagnosis Date  . Arthritis    osteoarthritis  . GERD (gastroesophageal reflux disease)   . Hypertension   . Nasal polyps    removed  . Peptic ulcer    hx. of  . Pre-diabetes   . Transfusion history 04-22-13   15 yrs ago-"bleeding ulcer"    Past Surgical History:  Procedure Laterality Date  . CATARACT EXTRACTION Left 04-22-13  . COLONOSCOPY    . JOINT REPLACEMENT     Right total knee arthroplasty  . NASAL SINUS SURGERY     polyps revoved 08-2017  . STOMACH SURGERY     oversew of bleeding ulcer  . TOTAL KNEE ARTHROPLASTY Left 04/27/2013   Procedure: LEFT TOTAL KNEE ARTHROPLASTY;  Surgeon: Loanne Drilling, MD;  Location: [facility];  Service: Orthopedics;  Laterality: Left;  . TOTAL KNEE ARTHROPLASTY Right 03/24/2018   Procedure: RIGHT TOTAL KNEE ARTHROPLASTY;  Surgeon: Ollen Gross, MD;  Location: [facility];  Service: Orthopedics;  Laterality: Right;    There were no vitals filed for this visit.  Subjective Assessment - 04/10/18 1405    Subjective  Patient was seen by MD yesterday - MD was pleased with pt's progress and told him that he does not need touse a walker or cane and can discontinue the compression socks. MD also allowed patient to mow yard using a riding mowed     Currently in Pain?  Yes    Pain Score  1    when he woke up 2/10     Pain Location  Knee    Pain Orientation  Right    Pain Descriptors / Indicators  Aching    Pain Onset  1 to 4 weeks ago    Pain Frequency  Intermittent    Aggravating Factors   worse first thing in the morning     Pain Relieving Factors  ice                        [facility] Adult PT Treatment/Exercise - 04/10/18 0001      Knee/Hip Exercises: Stretches   Passive Hamstring Stretch  Right;3 reps;30 seconds   supine with strap     Knee/Hip Exercises: Aerobic   Nustep  L5 x 6 min arms (11)       Knee/Hip Exercises: Standing   Hip Flexion  AROM;Right;Left;20 reps;Knee bent   working on hip and knee flexion 12 in step alternating feet    Forward Step Up  Right;1 set;20 reps;Hand Hold: 2;Step Height: 6"    SLS  Rt?Lt 30 sec x 3 each LE       Knee/Hip Exercises: Seated   Sit to Sand  10 reps;without UE support   low mat table,  2 stes     Knee/Hip Exercises: Supine   Quad Sets  Strengthening;Right;1 set;10 reps   5 sec holds   Short Arc Quad Sets  Strengthening;Right;10 reps   thigh resting on 6 in foam roll    Bridges  10 reps    Straight Leg Raises  Strengthening;Right;10 reps      Vasopneumatic   Number Minutes Vasopneumatic   15 minutes    Vasopnuematic Location   Knee   Rt   Vasopneumatic Pressure  Low    Vasopneumatic Temperature   34 deg       Manual Therapy   Manual therapy comments  pt supne for assisted knee extension  10-15 sec hold x 3 reps                   PT Long Term Goals - 04/07/18 1251      PT LONG TERM GOAL #1   Title  Independent in ambulation including stairs with least restrictive assistive device to no assistive device  05/09/18    Time  6    Period  Weeks    Status  On-going      PT LONG TERM GOAL #2   Title  Rt knee ROm 0 deg extension to 115 deg flexion 05/09/18    Time  6    Period  Weeks    Status  On-going      PT LONG TERM GOAL #3   Title  4+/5 to 5/5 strength Rt LE 05/09/18    Time  6    Period  Weeks     Status  On-going      PT LONG TERM GOAL #4   Title  Independent in HEP 05/09/18    Time  6    Period  Weeks    Status  On-going      PT LONG TERM GOAL #5   Title  Imrprove FOTO to </= 53% limitation 05/09/18    Time  6    Period  Weeks    Status  On-going            Plan - 04/10/18 1413    Clinical Impression Statement  MD very pleaesd with Zachary Lyons' progress and allowed him to ambulate without walker or cane and return to riding lawn mower. Patient is pleased with his new freedom. He is working on ROM and strength in clinic.  He is progressing well with knee rehab.     Rehab Potential  Good    PT Frequency  2x / week    PT Duration  6 weeks    PT Treatment/Interventions  Patient/family education;ADLs/Self Care Home Management;Cryotherapy;Electrical Stimulation;Iontophoresis 4mg /ml Dexamethasone;Moist Heat;Ultrasound;Dry needling;Manual techniques;Neuromuscular re-education;Gait training;Stair training;Functional mobility training;Therapeutic activities;Therapeutic exercise;Taping    PT Next Visit Plan  continued progressive ROM and strengthening of Rt knee; gait and stair training.     Consulted and Agree with Plan of Care  Family member/caregiver    Family Member Consulted  wife        Patient will benefit from skilled therapeutic intervention in order to improve the following deficits and impairments:  Pain, Increased fascial restricitons, Increased muscle spasms, Decreased mobility, Decreased range of motion, Decreased strength, Abnormal gait, Decreased activity tolerance, Decreased balance  Visit Diagnosis: Acute pain of right knee  Other symptoms and signs involving the musculoskeletal system  Muscle weakness (generalized)  Other abnormalities of gait and mobility     Problem List Patient Active Problem List   Diagnosis  Date Noted  . Nasal polyp 07/02/2017  . Thrombocytopenia (HCC) 04/24/2016  . Elevated LFTs 02/17/2015  . Cerumen impaction 04/23/2014  .  Prediabetes 04/20/2014  . Essential hypertension, benign 04/08/2014  . Hyperlipidemia 04/08/2014  . Habitual alcohol use 04/08/2014  . OA (osteoarthritis) of knee 04/27/2013    Celyn Rober Minion PT, MPH  04/10/2018, 2:55 PM  [facility] [address] [place] [address] [place], [place], [address] Phone: [phone]   Fax:  [phone]  Name: Zachary [place] MRN: [id number] Date of Birth: [birth year]

## 2018-04-14 ENCOUNTER — Encounter: Payer: Medicare HMO | Admitting: Physical Therapy

## 2018-04-16 ENCOUNTER — Encounter: Payer: Self-pay | Admitting: Physician Assistant

## 2018-04-16 ENCOUNTER — Ambulatory Visit (INDEPENDENT_AMBULATORY_CARE_PROVIDER_SITE_OTHER): Payer: Medicare HMO | Admitting: Physician Assistant

## 2018-04-16 ENCOUNTER — Ambulatory Visit (INDEPENDENT_AMBULATORY_CARE_PROVIDER_SITE_OTHER): Payer: Medicare HMO | Admitting: Physical Therapy

## 2018-04-16 VITALS — BP 106/62 | HR 51 | Ht 63.0 in | Wt 153.0 lb

## 2018-04-16 VITALS — BP 92/63 | HR 55

## 2018-04-16 DIAGNOSIS — R5383 Other fatigue: Secondary | ICD-10-CM | POA: Diagnosis not present

## 2018-04-16 DIAGNOSIS — R29898 Other symptoms and signs involving the musculoskeletal system: Secondary | ICD-10-CM

## 2018-04-16 DIAGNOSIS — R2689 Other abnormalities of gait and mobility: Secondary | ICD-10-CM

## 2018-04-16 DIAGNOSIS — I9589 Other hypotension: Secondary | ICD-10-CM | POA: Diagnosis not present

## 2018-04-16 DIAGNOSIS — E861 Hypovolemia: Secondary | ICD-10-CM

## 2018-04-16 DIAGNOSIS — M6281 Muscle weakness (generalized): Secondary | ICD-10-CM

## 2018-04-16 DIAGNOSIS — M25561 Pain in right knee: Secondary | ICD-10-CM

## 2018-04-16 LAB — POCT HEMOGLOBIN: Hemoglobin: 12.6 g/dL — AB (ref 14.1–18.1)

## 2018-04-16 NOTE — Therapy (Signed)
[facility] [place] [address] [place] [address] [place], [place], [address] Phone: [phone]   Fax:  [phone]  Physical Therapy Treatment  Patient Details  Name: Zachary [place] MRN: [id number] Date of Birth: 06-Nov-1945 Referring Provider (PT): Arther Abbott PA-C; Dr Despina Hick    Encounter Date: 04/16/2018    Past Medical History:  Diagnosis Date  . Arthritis    osteoarthritis  . GERD (gastroesophageal reflux disease)   . Hypertension   . Nasal polyps    removed  . Peptic ulcer    hx. of  . Pre-diabetes   . Transfusion history 04-22-13   15 yrs ago-"bleeding ulcer"    Past Surgical History:  Procedure Laterality Date  . CATARACT EXTRACTION Left 04-22-13  . COLONOSCOPY    . JOINT REPLACEMENT     Right total knee arthroplasty  . NASAL SINUS SURGERY     polyps revoved 08-2017  . STOMACH SURGERY     oversew of bleeding ulcer  . TOTAL KNEE ARTHROPLASTY Left 04/27/2013   Procedure: LEFT TOTAL KNEE ARTHROPLASTY;  Surgeon: Loanne Drilling, MD;  Location: [facility];  Service: Orthopedics;  Laterality: Left;  . TOTAL KNEE ARTHROPLASTY Right 03/24/2018   Procedure: RIGHT TOTAL KNEE ARTHROPLASTY;  Surgeon: Ollen Gross, MD;  Location: [facility];  Service: Orthopedics;  Laterality: Right;    Vitals:   04/16/18 1424  BP: 92/63  Pulse: (!) 55    Subjective Assessment - 04/16/18 1424    Subjective  "I just haven't felt good for 5-6 days. Haven't been able to eat anything. I don't feel good when I sit up, so I just lay back down and sleep more."  Per pt's wife, he is taking Tylenol and BP meds. Per pt he is also taking Tramadol (wife unaware).   Pt doesn't feel much like exercising today, "I just feel so weak".      Patient Stated Goals  use leg normally again     Currently in Pain?  No/denies    Pain Score  0-No pain   only when he bends knee past comfortable point   Pain Location  Knee    Pain Orientation  Right        PT Long Term  Goals - 04/07/18 1251      PT LONG TERM GOAL #1   Title  Independent in ambulation including stairs with least restrictive assistive device to no assistive device  05/09/18    Time  6    Period  Weeks    Status  On-going      PT LONG TERM GOAL #2   Title  Rt knee ROm 0 deg extension to 115 deg flexion 05/09/18    Time  6    Period  Weeks    Status  On-going      PT LONG TERM GOAL #3   Title  4+/5 to 5/5 strength Rt LE 05/09/18    Time  6    Period  Weeks    Status  On-going      PT LONG TERM GOAL #4   Title  Independent in HEP 05/09/18    Time  6    Period  Weeks    Status  On-going      PT LONG TERM GOAL #5   Title  Imrprove FOTO to </= 53% limitation 05/09/18    Time  6    Period  Weeks    Status  On-going  Plan - 04/16/18 1427    Clinical Impression Statement  Pt ambulating without AD or assistance upon arrival.  Pt pale in face. Pt reporting dizziness upon walking.  BP checked.  Seated BP 92/63 HR 55,  Standing 92/57, HR 54.   Treatment held due to pt feeling symptomatic and having low BP (per wife, BP usually 130/80).  Pt was accompanied during his walk to Family Medicine to see MD.      Rehab Potential  Good    PT Frequency  2x / week    PT Duration  6 weeks    PT Treatment/Interventions  Patient/family education;ADLs/Self Care Home Management;Cryotherapy;Electrical Stimulation;Iontophoresis 4mg /ml Dexamethasone;Moist Heat;Ultrasound;Dry needling;Manual techniques;Neuromuscular re-education;Gait training;Stair training;Functional mobility training;Therapeutic activities;Therapeutic exercise;Taping    PT Next Visit Plan  continued progressive ROM and strengthening of Rt knee; gait and stair training.     Consulted and Agree with Plan of Care  Family member/caregiver;Patient    Family Member Consulted  wife        Patient will benefit from skilled therapeutic intervention in order to improve the following deficits and impairments:  Pain, Increased  fascial restricitons, Increased muscle spasms, Decreased mobility, Decreased range of motion, Decreased strength, Abnormal gait, Decreased activity tolerance, Decreased balance  Visit Diagnosis: Acute pain of right knee  Other symptoms and signs involving the musculoskeletal system  Muscle weakness (generalized)  Other abnormalities of gait and mobility     Problem List Patient Active Problem List   Diagnosis Date Noted  . Nasal polyp 07/02/2017  . Thrombocytopenia (HCC) 04/24/2016  . Elevated LFTs 02/17/2015  . Cerumen impaction 04/23/2014  . Prediabetes 04/20/2014  . Essential hypertension, benign 04/08/2014  . Hyperlipidemia 04/08/2014  . Habitual alcohol use 04/08/2014  . OA (osteoarthritis) of knee 04/27/2013   Mayer Camel, PTA 04/16/18 2:30 PM  [facility] [place] [address] [place] [address] [place], [place], [address] Phone: [phone]   Fax:  [phone]  Name: Zachary [place] MRN: [id number] Date of Birth: October 25, 1945

## 2018-04-16 NOTE — Progress Notes (Signed)
Subjective:    Patient ID: Zachary [place], male    DOB: Dec 11, 1945, 72 y.o.   MRN: [id number]  HPI  Pt is a 72 yo male with HTN, GERD, HLD with recent right TKR on 9/9 who presents to the clinic with hypotension and fatigue. He went to PT and they sent him over here to be seen and refused therapy today. Pt is on metoprolol and diovan/hCT. Hx of low HR. BP normally elevated but since knee replacement been running lower. Admits to feeling tired. He has no appetite. He denies any SOB, CP, palpitations, headaches, vision changes, leg swelling, leg pain. He is on high dose ASA. He admits to not eating or drinking enough. Pt denies any melena or hematochezia. Pt denies any abdominal pain.   .. Active Ambulatory Problems    Diagnosis Date Noted  . OA (osteoarthritis) of knee 04/27/2013  . Essential hypertension, benign 04/08/2014  . Hyperlipidemia 04/08/2014  . Habitual alcohol use 04/08/2014  . Prediabetes 04/20/2014  . Cerumen impaction 04/23/2014  . Elevated LFTs 02/17/2015  . Thrombocytopenia (HCC) 04/24/2016  . Nasal polyp 07/02/2017   Resolved Ambulatory Problems    Diagnosis Date Noted  . No Resolved Ambulatory Problems   Past Medical History:  Diagnosis Date  . Arthritis   . GERD (gastroesophageal reflux disease)   . Hypertension   . Nasal polyps   . Peptic ulcer   . Pre-diabetes   . Transfusion history 04-22-13      Review of Systems    see HPI>  Objective:   Physical Exam  Constitutional: He is oriented to person, place, and time. He appears well-developed and well-nourished.  HENT:  Head: Normocephalic and atraumatic.  Right Ear: External ear normal.  Left Ear: External ear normal.  Nose: Nose normal.  Mouth/Throat: Oropharynx is clear and moist.  Eyes: Conjunctivae are normal. Right eye exhibits no discharge. Left eye exhibits no discharge.  Neck: Normal range of motion. Neck supple.  Cardiovascular: Regular rhythm and normal heart sounds.  No murmur  heard. Bradycardia.   Pulmonary/Chest: Effort normal and breath sounds normal. He has no wheezes.  Abdominal: Soft. Bowel sounds are normal. He exhibits no distension and no mass. There is no tenderness. There is no rebound and no guarding. No hernia.  Musculoskeletal:  Healing incision with no signs of infection over right knee. No swelling above or below knee. No pain to palpation.   Neurological: He is alert and oriented to person, place, and time.  Skin: No rash noted. There is pallor.  Psychiatric: He has a normal mood and affect. His behavior is normal.          Assessment & Plan:  Marland KitchenMarland KitchenDiagnoses and all orders for this visit:  Hypotension due to hypovolemia  Low energy -     POCT hemoglobin   .Marland Kitchen Results for orders placed or performed in visit on 04/16/18  POCT hemoglobin  Result Value Ref Range   Hemoglobin 12.6 (A) 14.1 - 18.1 g/dL   Hemoglobin looks good 11.7 in hospital before discharge and better today. Does not appear to be any blood loss.  He has had recent surgery but other than that risk for PE/DVT is low. No signs around wound and no SOb. He is on ASA. Pulse ox 100 percent.  HR stable and where his baseline is.  BP decreased likely due to dehydration. Discussed increasing hydration and increasing salt.  Decreased metoprolol to 2 tablets daily and decrease to diovian 160/25mg . Recheck BP on  Friday. If BP above 150/90 he can call or reach out. If has any SOB or increasing weakness follow up.   Marland KitchenMarland KitchenSpent 30 minutes with patient and greater than 50 percent of visit spent counseling patient regarding treatment plan.

## 2018-04-16 NOTE — Patient Instructions (Addendum)
Decrease diovian to 160/25 and decrease metoprolol to 2 tablets daily.  Push fluids increase salty foods.  Recheck before weekend on Friday.    Hypotension As your heart beats, it forces blood through your body. This force is called blood pressure. If you have hypotension, you have low blood pressure. When your blood pressure is too low, you may not get enough blood to your brain. You may feel weak, feel light-headed, have a fast heartbeat, or even pass out (faint). Follow these instructions at home: Eating and drinking  Drink enough fluids to keep your pee (urine) clear or pale yellow.  Eat a healthy diet, and follow instructions from your doctor about eating or drinking restrictions. A healthy diet includes: ? Fresh fruits and vegetables. ? Whole grains. ? Low-fat (lean) meats. ? Low-fat dairy products.  Eat extra salt only as told. Do not add extra salt to your diet unless your doctor tells you to.  Eat small meals often.  Avoid standing up quickly after you eat. Medicines  Take over-the-counter and prescription medicines only as told by your doctor. ? Follow instructions from your doctor about changing how much you take (the dosage) of your medicines, if this applies. ? Do not stop or change your medicine on your own. General instructions  Wear compression stockings as told by your doctor.  Get up slowly from lying down or sitting.  Avoid hot showers and a lot of heat as told by your doctor.  Return to your normal activities as told by your doctor. Ask what activities are safe for you.  Do not use any products that contain nicotine or tobacco, such as cigarettes and e-cigarettes. If you need help quitting, ask your doctor.  Keep all follow-up visits as told by your doctor. This is important. Contact a doctor if:  You throw up (vomit).  You have watery poop (diarrhea).  You have a fever for more than 2-3 days.  You feel more thirsty than normal.  You feel weak  and tired. Get help right away if:  You have chest pain.  You have a fast or irregular heartbeat.  You lose feeling (get numbness) in any part of your body.  You cannot move your arms or your legs.  You have trouble talking.  You get sweaty or feel light-headed.  You faint.  You have trouble breathing.  You have trouble staying awake.  You feel confused. This information is not intended to replace advice given to you by your health care provider. Make sure you discuss any questions you have with your health care provider. Document Released: 09/26/2009 Document Revised: 03/20/2016 Document Reviewed: 03/20/2016 Elsevier Interactive Patient Education  2017 [employer].

## 2018-04-17 ENCOUNTER — Encounter: Payer: Medicare HMO | Admitting: Rehabilitative and Restorative Service Providers"

## 2018-04-17 ENCOUNTER — Ambulatory Visit: Payer: Medicare HMO | Admitting: Osteopathic Medicine

## 2018-04-18 ENCOUNTER — Encounter: Payer: Self-pay | Admitting: Physician Assistant

## 2018-04-18 ENCOUNTER — Encounter: Payer: Self-pay | Admitting: Physical Therapy

## 2018-04-18 ENCOUNTER — Ambulatory Visit: Payer: Medicare HMO | Admitting: Physical Therapy

## 2018-04-18 VITALS — BP 147/79

## 2018-04-18 DIAGNOSIS — R29898 Other symptoms and signs involving the musculoskeletal system: Secondary | ICD-10-CM

## 2018-04-18 DIAGNOSIS — R2689 Other abnormalities of gait and mobility: Secondary | ICD-10-CM | POA: Diagnosis not present

## 2018-04-18 DIAGNOSIS — M25561 Pain in right knee: Secondary | ICD-10-CM

## 2018-04-18 DIAGNOSIS — M6281 Muscle weakness (generalized): Secondary | ICD-10-CM

## 2018-04-18 NOTE — Therapy (Signed)
[facility] [place] [address] [place], [place], [address] Phone: [phone]   Fax:  [phone]  Physical Therapy Treatment  Patient Details  Name: Zachary Lyons MRN: [id number] Date of Birth: 22-Nov-1945 Referring Provider (PT): Arther Abbott PA-C; Dr Despina Hick    Encounter Date: 04/18/2018  PT End of Session - 04/18/18 1223    Visit Number  6    Number of Visits  12    Date for PT Re-Evaluation  05/09/18    PT Start Time  1149    PT Stop Time  1244    PT Time Calculation (min)  55 min    Activity Tolerance  Patient tolerated treatment well;No increased pain    Behavior During Therapy  WFL for tasks assessed/performed       Past Medical History:  Diagnosis Date  . Arthritis    osteoarthritis  . GERD (gastroesophageal reflux disease)   . Hypertension   . Nasal polyps    removed  . Peptic ulcer    hx. of  . Pre-diabetes   . Transfusion history 04-22-13   15 yrs ago-"bleeding ulcer"    Past Surgical History:  Procedure Laterality Date  . CATARACT EXTRACTION Left 04-22-13  . COLONOSCOPY    . JOINT REPLACEMENT     Right total knee arthroplasty  . NASAL SINUS SURGERY     polyps revoved 08-2017  . STOMACH SURGERY     oversew of bleeding ulcer  . TOTAL KNEE ARTHROPLASTY Left 04/27/2013   Procedure: LEFT TOTAL KNEE ARTHROPLASTY;  Surgeon: Loanne Drilling, MD;  Location: [facility];  Service: Orthopedics;  Laterality: Left;  . TOTAL KNEE ARTHROPLASTY Right 03/24/2018   Procedure: RIGHT TOTAL KNEE ARTHROPLASTY;  Surgeon: Ollen Gross, MD;  Location: [facility];  Service: Orthopedics;  Laterality: Right;    Vitals:   04/18/18 1157  BP: (!) 147/79    Subjective Assessment - 04/18/18 1157    Subjective  Per pt's wife, pt's BP medicine was cut in half and he is no longer taking pain medicine.  Since then his BP has returned to WNL. Pt reports he is feeling better.  "I'm not as weak today".     Patient is accompained  by:  Family member   pt's wife   Patient Stated Goals  use leg normally again     Currently in Pain?  Yes    Pain Score  1     Pain Location  Knee    Pain Orientation  Right    Pain Descriptors / Indicators  --   "like a scratch"   Aggravating Factors   worse first thing in morning    Pain Relieving Factors  ice         [facility] PT Assessment - 04/18/18 0001      Assessment   Medical Diagnosis  Rt TKA    Referring Provider (PT)  Arther Abbott PA-C; Dr Despina Hick     Onset Date/Surgical Date  03/24/18    Hand Dominance  Right    Next MD Visit  05/02/18      AROM   Right Knee Extension  -3   supine with quad set   Right Knee Flexion  110   supine with heel slide      Flexibility   Soft Tissue Assessment /Muscle Length  yes    Quadriceps  Rt knee ~90 deg       [facility] Adult PT Treatment/Exercise - 04/18/18 0001  Exercises   Exercises  Knee/Hip      Knee/Hip Exercises: Stretches   Passive Hamstring Stretch  Right;3 reps;30 seconds   supine with strap   Quad Stretch  Right;3 reps;30 seconds   prone with strap      Knee/Hip Exercises: Aerobic   Nustep  L4: 6 min       Knee/Hip Exercises: Standing   Hip Abduction  Stengthening;Right;Left;1 set;10 reps;Knee straight   cues to slow down   Hip Extension  Stengthening;Right;Left;1 set;Knee straight;10 reps    Forward Step Up  Right;1 set;10 reps;Hand Hold: 2;Step Height: 6"   first 10 steps on 3" step, 2nd set on 6 in step   Step Down  Right;1 set;10 reps;Hand Hold: 2;Step Height: 6"    SLS  Rt/Lt 15 sec x 2 reps with occasional UE to steady (pt hesistant to try SLS without UE support)      Knee/Hip Exercises: Seated   Sit to Sand  10 reps;without UE support   low mat table; cues to slow down     Knee/Hip Exercises: Supine   Heel Slides  AAROM;Right;5 reps      Vasopneumatic   Number Minutes Vasopneumatic   15 minutes    Vasopnuematic Location   Knee   Rt   Vasopneumatic Pressure  Low    Vasopneumatic  Temperature   34 deg                   PT Long Term Goals - 04/07/18 1251      PT LONG TERM GOAL #1   Title  Independent in ambulation including stairs with least restrictive assistive device to no assistive device  05/09/18    Time  6    Period  Weeks    Status  On-going      PT LONG TERM GOAL #2   Title  Rt knee ROm 0 deg extension to 115 deg flexion 05/09/18    Time  6    Period  Weeks    Status  On-going      PT LONG TERM GOAL #3   Title  4+/5 to 5/5 strength Rt LE 05/09/18    Time  6    Period  Weeks    Status  On-going      PT LONG TERM GOAL #4   Title  Independent in HEP 05/09/18    Time  6    Period  Weeks    Status  On-going      PT LONG TERM GOAL #5   Title  Imrprove FOTO to </= 53% limitation 05/09/18    Time  6    Period  Weeks    Status  On-going            Plan - 04/18/18 1233    Clinical Impression Statement  Pt's BP much improved today; pt presents without symptoms and tolerated treatment without increase in pain or symptoms.  Pt able to ascend/descent 6" step with less difficulty. Pt's Rt knee ROM progressing well.  Pt making good gains towards goals.     Rehab Potential  Good    PT Frequency  2x / week    PT Duration  6 weeks    PT Treatment/Interventions  Patient/family education;ADLs/Self Care Home Management;Cryotherapy;Electrical Stimulation;Iontophoresis 4mg /ml Dexamethasone;Moist Heat;Ultrasound;Dry needling;Manual techniques;Neuromuscular re-education;Gait training;Stair training;Functional mobility training;Therapeutic activities;Therapeutic exercise;Taping    PT Next Visit Plan  continued progressive ROM and strengthening of Rt knee; gait and stair training.  Consulted and Agree with Plan of Care  Patient;Family member/caregiver    Family Member Consulted  wife        Patient will benefit from skilled therapeutic intervention in order to improve the following deficits and impairments:  Pain, Increased fascial  restricitons, Increased muscle spasms, Decreased mobility, Decreased range of motion, Decreased strength, Abnormal gait, Decreased activity tolerance, Decreased balance  Visit Diagnosis: Acute pain of right knee  Other symptoms and signs involving the musculoskeletal system  Muscle weakness (generalized)  Other abnormalities of gait and mobility     Problem List Patient Active Problem List   Diagnosis Date Noted  . Nasal polyp 07/02/2017  . Thrombocytopenia (HCC) 04/24/2016  . Elevated LFTs 02/17/2015  . Cerumen impaction 04/23/2014  . Prediabetes 04/20/2014  . Essential hypertension, benign 04/08/2014  . Hyperlipidemia 04/08/2014  . Habitual alcohol use 04/08/2014  . OA (osteoarthritis) of knee 04/27/2013   Mayer Camel, PTA 04/18/18 12:51 PM  [facility] [place] [address] [place], [place], [address] Phone: [phone]   Fax:  [phone]  Name: Zachary Lyons MRN: [id number] Date of Birth: [birth year]

## 2018-04-21 ENCOUNTER — Ambulatory Visit (INDEPENDENT_AMBULATORY_CARE_PROVIDER_SITE_OTHER): Payer: Medicare HMO | Admitting: Physical Therapy

## 2018-04-21 ENCOUNTER — Encounter: Payer: Self-pay | Admitting: Physician Assistant

## 2018-04-21 ENCOUNTER — Ambulatory Visit (INDEPENDENT_AMBULATORY_CARE_PROVIDER_SITE_OTHER): Payer: Medicare HMO

## 2018-04-21 ENCOUNTER — Ambulatory Visit (INDEPENDENT_AMBULATORY_CARE_PROVIDER_SITE_OTHER): Payer: Medicare HMO | Admitting: Physician Assistant

## 2018-04-21 VITALS — BP 127/73 | HR 58 | Ht 63.0 in | Wt 154.0 lb

## 2018-04-21 DIAGNOSIS — I9589 Other hypotension: Secondary | ICD-10-CM

## 2018-04-21 DIAGNOSIS — M549 Dorsalgia, unspecified: Secondary | ICD-10-CM

## 2018-04-21 DIAGNOSIS — R29898 Other symptoms and signs involving the musculoskeletal system: Secondary | ICD-10-CM

## 2018-04-21 DIAGNOSIS — M5030 Other cervical disc degeneration, unspecified cervical region: Secondary | ICD-10-CM

## 2018-04-21 DIAGNOSIS — M6281 Muscle weakness (generalized): Secondary | ICD-10-CM

## 2018-04-21 DIAGNOSIS — M419 Scoliosis, unspecified: Secondary | ICD-10-CM | POA: Diagnosis not present

## 2018-04-21 DIAGNOSIS — M62830 Muscle spasm of back: Secondary | ICD-10-CM

## 2018-04-21 DIAGNOSIS — I10 Essential (primary) hypertension: Secondary | ICD-10-CM

## 2018-04-21 DIAGNOSIS — M4184 Other forms of scoliosis, thoracic region: Secondary | ICD-10-CM

## 2018-04-21 DIAGNOSIS — M5136 Other intervertebral disc degeneration, lumbar region: Secondary | ICD-10-CM | POA: Diagnosis not present

## 2018-04-21 DIAGNOSIS — E861 Hypovolemia: Secondary | ICD-10-CM | POA: Diagnosis not present

## 2018-04-21 DIAGNOSIS — M47814 Spondylosis without myelopathy or radiculopathy, thoracic region: Secondary | ICD-10-CM | POA: Diagnosis not present

## 2018-04-21 DIAGNOSIS — M25561 Pain in right knee: Secondary | ICD-10-CM

## 2018-04-21 MED ORDER — DICLOFENAC SODIUM 1 % TD GEL: 4.0000 g | Freq: Four times a day (QID) | TRANSDERMAL | 1 refills | Status: DC

## 2018-04-21 MED ORDER — METOPROLOL SUCCINATE ER 50 MG PO TB24: 100.0000 mg | ORAL_TABLET | Freq: Every day | ORAL | 0 refills | Status: DC

## 2018-04-21 MED ORDER — CLOTRIMAZOLE-BETAMETHASONE 1-0.05 % EX CREA: 1.0000 "application " | TOPICAL_CREAM | Freq: Two times a day (BID) | CUTANEOUS | 1 refills | Status: DC

## 2018-04-21 NOTE — Progress Notes (Signed)
   Subjective:    Patient ID: Zachary Lyons, male    DOB: 19-Jul-1945, 72 y.o.   MRN: [id number]  HPI  Pt is a 72 yo male who is post right TKR who presented last week with low BP and weak feeling. Metoprolol was cut back to twice a day and diovan to 160/25mg  he used old tablets he had. He increased his salt and hydration. He feels much better. He has much better energy. He had PT today and did well.   He does mention right mid back pain for the last 2 years. Not tried anything. No work up been done. Does not remember any injury.   .. Active Ambulatory Problems    Diagnosis Date Noted  . OA (osteoarthritis) of knee 04/27/2013  . Essential hypertension, benign 04/08/2014  . Hyperlipidemia 04/08/2014  . Habitual alcohol use 04/08/2014  . Prediabetes 04/20/2014  . Cerumen impaction 04/23/2014  . Elevated LFTs 02/17/2015  . Thrombocytopenia (HCC) 04/24/2016  . Nasal polyp 07/02/2017  . Mild scoliosis 04/21/2018  . DDD (degenerative disc disease), cervical 04/21/2018  . DDD (degenerative disc disease), lumbar 04/21/2018  . Muscle spasm of back 04/22/2018  . Hypotension due to hypovolemia 04/22/2018  . Mid back pain on right side 04/22/2018   Resolved Ambulatory Problems    Diagnosis Date Noted  . No Resolved Ambulatory Problems   Past Medical History:  Diagnosis Date  . Arthritis   . GERD (gastroesophageal reflux disease)   . Hypertension   . Nasal polyps   . Peptic ulcer   . Pre-diabetes   . Transfusion history 04-22-13     Review of Systems See HPI.     Objective:   Physical Exam  Constitutional: He is oriented to person, place, and time. He appears well-developed and well-nourished.  HENT:  Head: Normocephalic and atraumatic.  Cardiovascular: Regular rhythm.  Bradycardia 58  Pulmonary/Chest: Effort normal and breath sounds normal.  Musculoskeletal:  No pain over thoracic spine to palpation. Pain is mid back  under scapula around rhomboids. Pain exacerbated by  right arm abduction.   Neurological: He is alert and oriented to person, place, and time.  Skin: No rash noted.  Psychiatric: He has a normal mood and affect. His behavior is normal.          Assessment & Plan:  Marland KitchenMarland KitchenDiagnoses and all orders for this visit:  Hypotension due to hypovolemia  Mid back pain on right side -     diclofenac sodium (VOLTAREN) 1 % GEL; Apply 4 g topically 4 (four) times daily. To affected joint. -     DG Thoracic Spine W/Swimmers  Essential hypertension, benign -     metoprolol succinate (TOPROL-XL) 50 MG 24 hr tablet; Take 2 tablets (100 mg total) by mouth daily. Take with or immediately following a meal.  Other orders -     clotrimazole-betamethasone (LOTRISONE) cream; Apply 1 application topically 2 (two) times daily.   Vitals are so much better. Pt is feeling better. Continue on metoprolol 2 tablets daily and diovan 160/25mg . He should follow up with PCP in 1 month.   Will get xray of mid back. Suspect muscle spasm/tightness. Diclofenac gel. Encouraged massage and heat. PT order placed due to 2 years of pain to do in combination with knee replacement rehab. Follow up as needed. May benefit from trigger point injections.

## 2018-04-21 NOTE — Therapy (Signed)
[facility] [place] [address] [place], [place], [address] Phone: [phone]   Fax:  [phone]  Physical Therapy Treatment  Patient Details  Name: Zachary Lyons MRN: [id number] Date of Birth: 08-28-1945 Referring Provider (PT): Arther Abbott PA-C; Dr Despina Hick    Encounter Date: 04/21/2018  PT End of Session - 04/21/18 1158    Visit Number  7    Number of Visits  12    Date for PT Re-Evaluation  05/09/18    PT Start Time  1152   pt arrived late   PT Stop Time  1248    PT Time Calculation (min)  56 min    Activity Tolerance  Patient tolerated treatment well;No increased pain    Behavior During Therapy  WFL for tasks assessed/performed       Past Medical History:  Diagnosis Date  . Arthritis    osteoarthritis  . GERD (gastroesophageal reflux disease)   . Hypertension   . Nasal polyps    removed  . Peptic ulcer    hx. of  . Pre-diabetes   . Transfusion history 04-22-13   15 yrs ago-"bleeding ulcer"    Past Surgical History:  Procedure Laterality Date  . CATARACT EXTRACTION Left 04-22-13  . COLONOSCOPY    . JOINT REPLACEMENT     Right total knee arthroplasty  . NASAL SINUS SURGERY     polyps revoved 08-2017  . STOMACH SURGERY     oversew of bleeding ulcer  . TOTAL KNEE ARTHROPLASTY Left 04/27/2013   Procedure: LEFT TOTAL KNEE ARTHROPLASTY;  Surgeon: Loanne Drilling, MD;  Location: [facility];  Service: Orthopedics;  Laterality: Left;  . TOTAL KNEE ARTHROPLASTY Right 03/24/2018   Procedure: RIGHT TOTAL KNEE ARTHROPLASTY;  Surgeon: Ollen Gross, MD;  Location: [facility];  Service: Orthopedics;  Laterality: Right;    There were no vitals filed for this visit.  Subjective Assessment - 04/21/18 1159    Subjective  Pt arrives with back brace on.  Per pt's wife, he is interested in seeing a doctor for his occasional back pain. "I'd like to finish my knees first".  Pt reports his Rt knee is stiff first thing in  morning, but once he walks around, the stiffness and pain resolve.   Pt was blowing leavings and mowing grass this weekend.     Patient is accompained by:  Family member   wife   Currently in Pain?  No/denies    Pain Score  0-No pain         [facility] PT Assessment - 04/21/18 0001      Assessment   Medical Diagnosis  Rt TKA    Referring Provider (PT)  Arther Abbott PA-C; Dr Despina Hick     Onset Date/Surgical Date  03/24/18    Hand Dominance  Right    Next MD Visit  05/02/18      AROM   Right Knee Extension  -2    Right Knee Flexion  112   seated scoot       [facility] Adult PT Treatment/Exercise - 04/21/18 0001      Exercises   Exercises  Knee/Hip      Knee/Hip Exercises: Stretches   Passive Hamstring Stretch  Right;3 reps;30 seconds   supine with strap   Quad Stretch  Right;3 reps;30 seconds   prone with strap    Gastroc Stretch  Right;Left;2 reps;30 seconds      Knee/Hip Exercises: Aerobic   Recumbent Bike  partial to  full slow revolutions for Rt knee ROM x 7 min       Knee/Hip Exercises: Standing   Forward Step Up  Right;1 set;10 reps;Hand Hold: 2;Step Height: 6"   first 10 steps on 3" step, 2nd set on 6 in step   Step Down  Left;2 sets;5 reps;Hand Hold: 2   3", 6" step     Knee/Hip Exercises: Seated   Knee/Hip Flexion  seated scoot x 10 reps (cues for technique)      Knee/Hip Exercises: Prone   Hamstring Curl  1 set;10 reps    Hip Extension  Right;10 reps;Left;5 reps    Prone Knee Hang  --   30 sec x 3 reps     Vasopneumatic   Number Minutes Vasopneumatic   15 minutes    Vasopnuematic Location   Knee   Rt   Vasopneumatic Pressure  Medium    Vasopneumatic Temperature   34 deg              PT Education - 04/21/18 1248    Education Details  HEP- added prone hamstring curls and prone hang    Person(s) Educated  Spouse;Patient    Methods  Explanation;Demonstration;Verbal cues;Handout    Comprehension  Returned demonstration;Verbalized understanding           PT Long Term Goals - 04/07/18 1251      PT LONG TERM GOAL #1   Title  Independent in ambulation including stairs with least restrictive assistive device to no assistive device  05/09/18    Time  6    Period  Weeks    Status  On-going      PT LONG TERM GOAL #2   Title  Rt knee ROm 0 deg extension to 115 deg flexion 05/09/18    Time  6    Period  Weeks    Status  On-going      PT LONG TERM GOAL #3   Title  4+/5 to 5/5 strength Rt LE 05/09/18    Time  6    Period  Weeks    Status  On-going      PT LONG TERM GOAL #4   Title  Independent in HEP 05/09/18    Time  6    Period  Weeks    Status  On-going      PT LONG TERM GOAL #5   Title  Imrprove FOTO to </= 53% limitation 05/09/18    Time  6    Period  Weeks    Status  On-going            Plan - 04/21/18 1242    Clinical Impression Statement  Pt able to make slow full revolutions on bicycle today.   Stairs are improving, however he still demonstrates compensatory strategies with descending stairs to avoid knee flexion. Pt progressing well towards goals.    Rehab Potential  Good    PT Frequency  2x / week    PT Duration  6 weeks    PT Treatment/Interventions  Patient/family education;ADLs/Self Care Home Management;Cryotherapy;Electrical Stimulation;Iontophoresis 4mg /ml Dexamethasone;Moist Heat;Ultrasound;Dry needling;Manual techniques;Neuromuscular re-education;Gait training;Stair training;Functional mobility training;Therapeutic activities;Therapeutic exercise;Taping    PT Next Visit Plan  continued progressive ROM and strengthening of Rt knee;  stair training.        Patient will benefit from skilled therapeutic intervention in order to improve the following deficits and impairments:  Pain, Increased fascial restricitons, Increased muscle spasms, Decreased mobility, Decreased range of motion, Decreased strength, Abnormal gait,  Decreased activity tolerance, Decreased balance  Visit Diagnosis: Acute pain of  right knee  Other symptoms and signs involving the musculoskeletal system  Muscle weakness (generalized)     Problem List Patient Active Problem List   Diagnosis Date Noted  . Nasal polyp 07/02/2017  . Thrombocytopenia (HCC) 04/24/2016  . Elevated LFTs 02/17/2015  . Cerumen impaction 04/23/2014  . Prediabetes 04/20/2014  . Essential hypertension, benign 04/08/2014  . Hyperlipidemia 04/08/2014  . Habitual alcohol use 04/08/2014  . OA (osteoarthritis) of knee 04/27/2013   Mayer Camel, PTA 04/21/18 12:56 PM  [facility] [place] [address] [place], [place], [address] Phone: [phone]   Fax:  [phone]  Name: Zachary Lyons MRN: [id number] Date of Birth: March 01, 1946

## 2018-04-21 NOTE — Progress Notes (Signed)
As suspected degenerative changes over cervical and lower spine. Has a little bit of scolosis. Treatment plan stays the same diclofenac gel and PT. I think this is more muscular.

## 2018-04-21 NOTE — Patient Instructions (Signed)
1 month Dr. Lyn Hollingshead.

## 2018-04-21 NOTE — Patient Instructions (Signed)
KNEE: Knee Hang - Prone    Lie on stomach. Place towel above knee; hang feet off surface. Keep feet straight. Hold _30__ seconds. _2__ reps per set, __1-2_ sets per day, _5__ days per week  KNEE: Flexion - Prone    Bend knee. Raise heel toward buttocks. Do not raise hips. __5-10_ reps per set, _2__ sets per day, _5__ days per week   [facility] [address] [place], [place] [address]  [phone] (office) [phone] (fax)

## 2018-04-22 DIAGNOSIS — I9589 Other hypotension: Principal | ICD-10-CM

## 2018-04-22 DIAGNOSIS — M62830 Muscle spasm of back: Secondary | ICD-10-CM | POA: Insufficient documentation

## 2018-04-22 DIAGNOSIS — E861 Hypovolemia: Secondary | ICD-10-CM | POA: Insufficient documentation

## 2018-04-22 DIAGNOSIS — G8929 Other chronic pain: Secondary | ICD-10-CM | POA: Insufficient documentation

## 2018-04-22 DIAGNOSIS — M549 Dorsalgia, unspecified: Secondary | ICD-10-CM | POA: Insufficient documentation

## 2018-04-24 ENCOUNTER — Ambulatory Visit (INDEPENDENT_AMBULATORY_CARE_PROVIDER_SITE_OTHER): Payer: Medicare HMO | Admitting: Physical Therapy

## 2018-04-24 ENCOUNTER — Encounter: Payer: Self-pay | Admitting: Physical Therapy

## 2018-04-24 DIAGNOSIS — R29898 Other symptoms and signs involving the musculoskeletal system: Secondary | ICD-10-CM | POA: Diagnosis not present

## 2018-04-24 DIAGNOSIS — M25561 Pain in right knee: Secondary | ICD-10-CM | POA: Diagnosis not present

## 2018-04-24 DIAGNOSIS — M6281 Muscle weakness (generalized): Secondary | ICD-10-CM | POA: Diagnosis not present

## 2018-04-24 NOTE — Therapy (Signed)
[facility] [place] [address] [place], [place], [address] Phone: [phone]   Fax:  [phone]  Physical Therapy Treatment  Patient Details  Name: Zachary Lyons MRN: [id number] Date of Birth: 01/03/1946 Referring Provider (PT): Arther Abbott PA-C; Dr Despina Hick    Encounter Date: 04/24/2018  PT End of Session - 04/24/18 1156    Visit Number  8    Number of Visits  12    Date for PT Re-Evaluation  05/09/18    PT Start Time  1150    PT Stop Time  1245    PT Time Calculation (min)  55 min       Past Medical History:  Diagnosis Date  . Arthritis    osteoarthritis  . GERD (gastroesophageal reflux disease)   . Hypertension   . Nasal polyps    removed  . Peptic ulcer    hx. of  . Pre-diabetes   . Transfusion history 04-22-13   15 yrs ago-"bleeding ulcer"    Past Surgical History:  Procedure Laterality Date  . CATARACT EXTRACTION Left 04-22-13  . COLONOSCOPY    . JOINT REPLACEMENT     Right total knee arthroplasty  . NASAL SINUS SURGERY     polyps revoved 08-2017  . STOMACH SURGERY     oversew of bleeding ulcer  . TOTAL KNEE ARTHROPLASTY Left 04/27/2013   Procedure: LEFT TOTAL KNEE ARTHROPLASTY;  Surgeon: Loanne Drilling, MD;  Location: [facility];  Service: Orthopedics;  Laterality: Left;  . TOTAL KNEE ARTHROPLASTY Right 03/24/2018   Procedure: RIGHT TOTAL KNEE ARTHROPLASTY;  Surgeon: Ollen Gross, MD;  Location: [facility];  Service: Orthopedics;  Laterality: Right;    There were no vitals filed for this visit.  Subjective Assessment - 04/24/18 1156    Subjective  "I have pain when I first wake up. Once I start walking, it goes away".      Patient Stated Goals  use leg normally again     Currently in Pain?  No/denies    Pain Score  0-No pain         [facility] PT Assessment - 04/24/18 0001      Assessment   Medical Diagnosis  Rt TKA    Referring Provider (PT)  Arther Abbott PA-C; Dr Despina Hick     Onset  Date/Surgical Date  03/24/18    Hand Dominance  Right    Next MD Visit  05/02/18      AROM   Right Knee Flexion  116       [facility] Adult PT Treatment/Exercise - 04/24/18 0001      Knee/Hip Exercises: Stretches   Passive Hamstring Stretch  Right;2 reps;30 seconds    Quad Stretch  Right;3 reps;30 seconds   prone with strap, LLE x 2 reps   Gastroc Stretch  Right;Left;2 reps;30 seconds    Other Knee/Hip Stretches  forward lunge with foot on 13" step x 5 sec hold x 10 reps       Knee/Hip Exercises: Aerobic   Recumbent Bike  partial to full revolutions (able to start bike for 1 min)for Rt knee ROM x 7 min     Other Aerobic  laps around gym in between exercises to decrease stiffness.       Knee/Hip Exercises: Standing   Heel Raises  Both;1 set;15 reps   with toe raises, UE support     Forward Step Up  Right;1 set;10 reps    Functional Squat  1 set;10 reps  SLS  Rt/Lt 15 sec x 2 reps with occasional UE to steady- pt able to stand 3-5 sec without support.     Other Standing Knee Exercises  Toe taps to 13" step with light UE support x 10 each leg    Other Standing Knee Exercises  reviewed how to perform proper squat to pick up item from floor with good back body mechanics.       Knee/Hip Exercises: Seated   Sit to Sand  10 reps;without UE support   low mat table; cues to slow down     Knee/Hip Exercises: Supine   Heel Slides  AAROM;Right;5 reps   supine with strap   Bridges  10 reps   5 sec hold     Vasopneumatic   Number Minutes Vasopneumatic   15 minutes    Vasopnuematic Location   Knee   Rt   Vasopneumatic Pressure  Medium    Vasopneumatic Temperature   34 deg                   PT Long Term Goals - 04/07/18 1251      PT LONG TERM GOAL #1   Title  Independent in ambulation including stairs with least restrictive assistive device to no assistive device  05/09/18    Time  6    Period  Weeks    Status  On-going      PT LONG TERM GOAL #2   Title  Rt knee ROm 0  deg extension to 115 deg flexion 05/09/18    Time  6    Period  Weeks    Status  On-going      PT LONG TERM GOAL #3   Title  4+/5 to 5/5 strength Rt LE 05/09/18    Time  6    Period  Weeks    Status  On-going      PT LONG TERM GOAL #4   Title  Independent in HEP 05/09/18    Time  6    Period  Weeks    Status  On-going      PT LONG TERM GOAL #5   Title  Imrprove FOTO to </= 53% limitation 05/09/18    Time  6    Period  Weeks    Status  On-going            Plan - 04/24/18 1244    Clinical Impression Statement  Pt able to turn recumbent bicycle on after 5 min of slow revolutions.  ROM gradually improving.  Stairs were more challenging today.  Pt verbalized readiness to d/c for knee over next few visits and start therapy on his low back.     Rehab Potential  Good    PT Frequency  2x / week    PT Duration  6 weeks    PT Treatment/Interventions  Patient/family education;ADLs/Self Care Home Management;Cryotherapy;Electrical Stimulation;Iontophoresis 4mg /ml Dexamethasone;Moist Heat;Ultrasound;Dry needling;Manual techniques;Neuromuscular re-education;Gait training;Stair training;Functional mobility training;Therapeutic activities;Therapeutic exercise;Taping    PT Next Visit Plan  MD note; FOTO.      Consulted and Agree with Plan of Care  Patient;Family member/caregiver    Family Member Consulted  wife        Patient will benefit from skilled therapeutic intervention in order to improve the following deficits and impairments:  Pain, Increased fascial restricitons, Increased muscle spasms, Decreased mobility, Decreased range of motion, Decreased strength, Abnormal gait, Decreased activity tolerance, Decreased balance  Visit Diagnosis: Acute pain of right knee  Other  symptoms and signs involving the musculoskeletal system  Muscle weakness (generalized)     Problem List Patient Active Problem List   Diagnosis Date Noted  . Muscle spasm of back 04/22/2018  . Hypotension  due to hypovolemia 04/22/2018  . Mid back pain on right side 04/22/2018  . Mild scoliosis 04/21/2018  . DDD (degenerative disc disease), cervical 04/21/2018  . DDD (degenerative disc disease), lumbar 04/21/2018  . Nasal polyp 07/02/2017  . Thrombocytopenia (HCC) 04/24/2016  . Elevated LFTs 02/17/2015  . Cerumen impaction 04/23/2014  . Prediabetes 04/20/2014  . Essential hypertension, benign 04/08/2014  . Hyperlipidemia 04/08/2014  . Habitual alcohol use 04/08/2014  . OA (osteoarthritis) of knee 04/27/2013   Mayer Camel, PTA 04/24/18 1:01 PM  [facility] [place] [address] [place], [place], [address] Phone: [phone]   Fax:  [phone]  Name: Zachary Lyons MRN: [id number] Date of Birth: 09-23-1945

## 2018-04-28 ENCOUNTER — Encounter: Payer: Self-pay | Admitting: Rehabilitative and Restorative Service Providers"

## 2018-04-28 ENCOUNTER — Ambulatory Visit: Payer: Medicare HMO | Admitting: Physical Therapy

## 2018-04-28 ENCOUNTER — Ambulatory Visit: Payer: Medicare HMO | Admitting: Rehabilitative and Restorative Service Providers"

## 2018-04-28 DIAGNOSIS — M546 Pain in thoracic spine: Secondary | ICD-10-CM | POA: Diagnosis not present

## 2018-04-28 NOTE — Patient Instructions (Addendum)
Side Bend, Sitting    Sit with feet flat on floor. Bend to one side, touching elbow to floor. Hold _20-30__ seconds. To increase stretch, raise other arm above head.  Repeat _2-3__ times per session. Do _1-2__ sessions per day.  Lat stretch in supine  (hand drawn exercise)

## 2018-04-28 NOTE — Therapy (Signed)
[facility] [place] [address] [place], [place], [address] Phone: [phone]   Fax:  [phone]  Physical Therapy Treatment  Patient Details  Name: Zachary Lyons MRN: [id number] Date of Birth: [birth year] Referring Provider (PT): Arther Abbott PA-C; Dr Despina Hick; Marlene Lard, PA-C    Encounter Date: 04/28/2018  PT End of Session - 04/28/18 1247    Visit Number  9    Number of Visits  24    Date for PT Re-Evaluation  06/09/18    PT Start Time  1150    PT Stop Time  1247    PT Time Calculation (min)  57 min    Activity Tolerance  Patient tolerated treatment well       Past Medical History:  Diagnosis Date  . Arthritis    osteoarthritis  . GERD (gastroesophageal reflux disease)   . Hypertension   . Nasal polyps    removed  . Peptic ulcer    hx. of  . Pre-diabetes   . Transfusion history 04-22-13   15 yrs ago-"bleeding ulcer"    Past Surgical History:  Procedure Laterality Date  . CATARACT EXTRACTION Left 04-22-13  . COLONOSCOPY    . JOINT REPLACEMENT     Right total knee arthroplasty  . NASAL SINUS SURGERY     polyps revoved 08-2017  . STOMACH SURGERY     oversew of bleeding ulcer  . TOTAL KNEE ARTHROPLASTY Left 04/27/2013   Procedure: LEFT TOTAL KNEE ARTHROPLASTY;  Surgeon: Loanne Drilling, MD;  Location: [facility];  Service: Orthopedics;  Laterality: Left;  . TOTAL KNEE ARTHROPLASTY Right 03/24/2018   Procedure: RIGHT TOTAL KNEE ARTHROPLASTY;  Surgeon: Ollen Gross, MD;  Location: [facility];  Service: Orthopedics;  Laterality: Right;    There were no vitals filed for this visit.  Subjective Assessment - 04/28/18 1154    Subjective  LBP has been present for the past 5 yrs ago but symptoms have been getting worse in the past few months. He had Lt TKA 03/24/18.     Pertinent History  Lt TKA 2014; pre diabetic; HTN; history of LBP x ~ 5 yrs     Currently in Pain?  No/denies    Multiple Pain Sites  Yes    Pain Score  3    Pain Location  Back    Pain Orientation  Right    Pain Descriptors / Indicators  Burning;Tightness    Pain Type  Chronic pain    Pain Onset  More than a month ago    Pain Frequency  Intermittent    Aggravating Factors   lifting; reaching; doing dishes     Pain Relieving Factors  abdominal binder          [facility] PT Assessment - 04/28/18 0001      Assessment   Medical Diagnosis  Rt TKA; mid back pain     Referring Provider (PT)  Arther Abbott PA-C; Dr Despina Hick; Marlene Lard, PA-C     Onset Date/Surgical Date  03/24/18   LBP past several months    Hand Dominance  Right    Next MD Visit  05/02/18      Precautions   Precautions  None      Restrictions   Weight Bearing Restrictions  No      Balance Screen   Has the patient fallen in the past 6 months  No    Has the patient had a decrease in activity level because of a fear  of falling?   No    Is the patient reluctant to leave their home because of a fear of falling?   No      Prior Function   Level of Independence  Independent    Vocation  Retired    Restaurant manager, fast food - retired 5 yrs ago     Leisure  garage working on cars; cleaning up; yard work       Glass blower/designer  WNL's per pt report       Posture/Postural Control   Posture Comments  head forward; shoulders rounded and elevated       AROM   Right/Left Shoulder  --   end range tightness bilat shoulders    Cervical Flexion  46    Cervical Extension  60    Cervical - Right Side Bend  24    Cervical - Left Side Bend  22    Cervical - Right Rotation  60    Cervical - Left Rotation  60      Strength   Right/Left Shoulder  --   5/5 bilat shoulders      Palpation   Spinal mobility  hyoomobile thoracic spine with CPA and lateral glides     Palpation comment  muscular tightness through the Rt mid back in the area of the lats and serratus posterior                    [facility] Adult PT  Treatment/Exercise - 04/28/18 0001      Lumbar Exercises: Stretches   Other Lumbar Stretch Exercise  lat stretch supine - supine 30 sec x 3 PT assist     Other Lumbar Stretch Exercise  lateral trunk flexion to the Lt hand overhead  30 sec x 2 sitting feet on floor       Moist Heat Therapy   Number Minutes Moist Heat  20 Minutes    Moist Heat Location  Shoulder;Lumbar Spine   thoracic spine      Electrical Stimulation   Electrical Stimulation Location  Rt posterior inferior scapular area     Electrical Stimulation Action  IFC    Electrical Stimulation Parameters  to tolerance    Electrical Stimulation Goals  Tone;Pain             PT Education - 04/28/18 1227    Education Details  HEP     Person(s) Educated  Patient;Spouse    Methods  Explanation;Demonstration;Tactile cues;Verbal cues;Handout    Comprehension  Verbalized understanding;Returned demonstration;Verbal cues required;Tactile cues required          PT Long Term Goals - 04/28/18 1305      PT LONG TERM GOAL #1   Title  Independent in ambulation including stairs with least restrictive assistive device to no assistive device  05/09/18    Time  6    Period  Weeks    Status  On-going      PT LONG TERM GOAL #2   Title  Rt knee ROM 0 deg extension to 115 deg flexion 05/09/18    Time  6    Period  Weeks    Status  On-going      PT LONG TERM GOAL #3   Title  4+/5 to 5/5 strength Rt LE 05/09/18    Time  6    Period  Weeks    Status  On-going      PT LONG TERM GOAL #4  Title  Independent in HEP 05/09/18    Time  6    Period  Weeks    Status  On-going      PT LONG TERM GOAL #5   Title  Imrprove FOTO to </= 53% limitation 05/09/18    Time  6    Period  Weeks    Status  On-going      PT LONG TERM GOAL #6   Title  Decrease pain Rt thoracic region by 50-75% allowing patient to patient to use UE's for functional activity 06/09/18    Time  6    Period  Weeks    Status  New      PT LONG TERM GOAL #7    Title  Improve thoracic posture and alignment with patient to demonstrate improved upright posture with posterior shoulder girdle engaged 06/09/18    Time  6    Period  Weeks            Plan - 04/28/18 1252    Clinical Impression Statement  Dex presents today for evaluation of mid back pain which has increased in the past few months. Patient reports that he has had mid back pain for the past 5 years. Symptoms are Rt sided and are increased with use of the Rt UE for functional activities. He reports that symptoms are improved with use of velcro abdominal support. He has poor cervical and thoracic posture; limited trunk and cervical ROM; muscular tightness through Rt lats and serratus posterior; pain with functional activities.     Rehab Potential  Good    PT Frequency  2x / week    PT Duration  12 weeks    PT Treatment/Interventions  Patient/family education;ADLs/Self Care Home Management;Cryotherapy;Electrical Stimulation;Iontophoresis 4mg /ml Dexamethasone;Moist Heat;Ultrasound;Dry needling;Manual techniques;Neuromuscular re-education;Gait training;Stair training;Functional mobility training;Therapeutic activities;Therapeutic exercise;Taping    PT Next Visit Plan  assess response to lat stretch; modalities Rt thoracic spine     Consulted and Agree with Plan of Care  Patient       Patient will benefit from skilled therapeutic intervention in order to improve the following deficits and impairments:  Pain, Increased fascial restricitons, Increased muscle spasms, Decreased mobility, Decreased range of motion, Decreased strength, Abnormal gait, Decreased activity tolerance, Decreased balance  Visit Diagnosis: Pain in thoracic spine     Problem List Patient Active Problem List   Diagnosis Date Noted  . Muscle spasm of back 04/22/2018  . Hypotension due to hypovolemia 04/22/2018  . Mid back pain on right side 04/22/2018  . Mild scoliosis 04/21/2018  . DDD (degenerative disc disease),  cervical 04/21/2018  . DDD (degenerative disc disease), lumbar 04/21/2018  . Nasal polyp 07/02/2017  . Thrombocytopenia (HCC) 04/24/2016  . Elevated LFTs 02/17/2015  . Cerumen impaction 04/23/2014  . Prediabetes 04/20/2014  . Essential hypertension, benign 04/08/2014  . Hyperlipidemia 04/08/2014  . Habitual alcohol use 04/08/2014  . OA (osteoarthritis) of knee 04/27/2013    Celyn Rober Minion PT, MPH  04/28/2018, 1:13 PM  [facility] [address] [place], [place], [address] Phone: [phone]   Fax:  [phone]  Name: Prayan Ulin MRN: [id number] Date of Birth: June 19, 1946

## 2018-04-29 DIAGNOSIS — M1711 Unilateral primary osteoarthritis, right knee: Secondary | ICD-10-CM | POA: Diagnosis not present

## 2018-05-01 ENCOUNTER — Encounter: Payer: Medicare HMO | Admitting: Physical Therapy

## 2018-05-01 ENCOUNTER — Ambulatory Visit (INDEPENDENT_AMBULATORY_CARE_PROVIDER_SITE_OTHER): Payer: Medicare HMO | Admitting: Physical Therapy

## 2018-05-01 VITALS — BP 130/79 | HR 80

## 2018-05-01 DIAGNOSIS — M6281 Muscle weakness (generalized): Secondary | ICD-10-CM | POA: Diagnosis not present

## 2018-05-01 DIAGNOSIS — M25561 Pain in right knee: Secondary | ICD-10-CM | POA: Diagnosis not present

## 2018-05-01 DIAGNOSIS — R29898 Other symptoms and signs involving the musculoskeletal system: Secondary | ICD-10-CM | POA: Diagnosis not present

## 2018-05-01 NOTE — Therapy (Signed)
[place], [place], [address] Phone: [phone]   Fax:  [phone]  Physical Therapy Treatment Progress Note Reporting Period 03/28/18 to 05/01/18  See note below for Objective Data and Assessment of Progress/Goals.       Patient Details  Name: Zachary Lyons MRN: [id number] Date of Birth: 1945-08-01 Referring Provider (PT): Theresa Duty PA-C; Dr Maureen Ralphs    Encounter Date: 05/01/2018  PT End of Session - 05/01/18 1408    Visit Number  10    Number of Visits  24    Date for PT Re-Evaluation  06/09/18    PT Start Time  1402    PT Stop Time  1452    PT Time Calculation (min)  50 min    Activity Tolerance  Patient tolerated treatment well    Behavior During Therapy  [facility] for tasks assessed/performed       Past Medical History:  Diagnosis Date  . Arthritis    osteoarthritis  . GERD (gastroesophageal reflux disease)   . Hypertension   . Nasal polyps    removed  . Peptic ulcer    hx. of  . Pre-diabetes   . Transfusion history 04-22-13   15 yrs ago-"bleeding ulcer"    Past Surgical History:  Procedure Laterality Date  . CATARACT EXTRACTION Left 04-22-13  . COLONOSCOPY    . JOINT REPLACEMENT     Right total knee arthroplasty  . NASAL SINUS SURGERY     polyps revoved 08-2017  . STOMACH SURGERY     oversew of bleeding ulcer  . TOTAL KNEE ARTHROPLASTY Left 04/27/2013   Procedure: LEFT TOTAL KNEE ARTHROPLASTY;  Surgeon: Gearlean Alf, MD;  [facility]: [facility];  Service: Orthopedics;  Laterality: Left;  . TOTAL KNEE ARTHROPLASTY Right 03/24/2018   Procedure: RIGHT TOTAL KNEE ARTHROPLASTY;  Surgeon: Gaynelle Arabian, MD;  Location: [facility];  Service: Orthopedics;  Laterality: Right;    Vitals:   05/01/18 1411  BP: 130/79  Pulse: 80    Subjective Assessment - 05/01/18 1411    Subjective  Per pt, his surgeon was pleased with his progress.  He is to do one more visit and then will  d/c for knee.  He states his knee is doing well.     Pertinent History  Lt TKA 2014; pre diabetic; HTN; history of LBP x ~ 5 yrs     Patient Stated Goals  use leg normally again     Currently in Pain?  Yes    Pain Score  1     Pain Location  Knee    Pain Orientation  Right    Pain Descriptors / Indicators  Tightness    Aggravating Factors   first thing in the morning     Pain Relieving Factors  ice         [facility] PT Assessment - 05/01/18 0001      Assessment   Medical Diagnosis  Rt TKA    Referring Provider (PT)  Theresa Duty PA-C; Dr Maureen Ralphs     Onset Date/Surgical Date  03/24/18    Hand Dominance  Right      Observation/Other Assessments   Focus on Therapeutic Outcomes (FOTO)   50% limited.       ROM / Strength   AROM / PROM / Strength  Strength;AROM      AROM   Right Knee Extension  -2   supine with quad   Right Knee Flexion  110  supine with heel slide     Strength   Strength Assessment Site  Knee;Hip    Right/Left Hip  Right    Right Hip Flexion  5/5    Right Hip Extension  4+/5    Right Hip ABduction  --   5-/5   Right/Left Knee  Right    Right Knee Flexion  5/5    Right Knee Extension  5/5         [facility] Adult PT Treatment/Exercise - 05/01/18 0001      Knee/Hip Exercises: Aerobic   Recumbent Bike  partial revolutions (able to start bike for 1 min)for Rt knee ROM x 7 min     Other Aerobic  laps around gym in between exercises to decrease stiffness.       Knee/Hip Exercises: Standing   Forward Step Up  Right;1 set;20 reps;Hand Hold: 0;Hand Hold: 1;Hand Hold: 2   3" and 6" steps   Step Down  Left;1 set;5 reps;Hand Hold: 2;Step Height: 6"   and retro step up.    SLS  Rt/Lt 15 sec x 3 reps with occasional UE to steady- pt able to stand 5-8 sec without support.     Other Standing Knee Exercises  forward lunge with Rt foot on 13" step and then hamstring stretch x 5 sec hold each direction, x 10 reps - to increased Rt knee ROM    Other Standing Knee  Exercises  reciprocal steps with heavy cues f or sequence.       Knee/Hip Exercises: Seated   Knee/Hip Flexion  seated scoot x 5 reps (cues for technique)      Knee/Hip Exercises: Supine   Quad Sets  Right;1 set;10 reps    Heel Slides  AAROM;Right;5 reps   supine with strap   Bridges  10 reps   5 sec hold     Vasopneumatic   Number Minutes Vasopneumatic   10 minutes    Vasopnuematic Location   Knee   Rt   Vasopneumatic Pressure  Medium    Vasopneumatic Temperature   34 deg                   PT Long Term Goals - 05/01/18 1425      PT LONG TERM GOAL #1   Title  Independent in ambulation including stairs with least restrictive assistive device to no assistive device  05/09/18    Time  6    Period  Weeks    Status  Achieved      PT LONG TERM GOAL #2   Title  Rt knee ROM 0 deg extension to 115 deg flexion 05/09/18    Time  6    Period  Weeks    Status  Not Met      PT LONG TERM GOAL #3   Title  4+/5 to 5/5 strength Rt LE 05/09/18    Time  6    Period  Weeks    Status  Achieved      PT LONG TERM GOAL #4   Title  Independent in HEP 05/09/18    Time  6    Period  Weeks    Status  Achieved      PT LONG TERM GOAL #5   Title  Imrprove FOTO to </= 53% limitation 05/09/18    Time  6    Period  Weeks    Status  Achieved  Plan - 05/01/18 1447    Clinical Impression Statement  Zachary Lyons's Rt knee ROM slightly less than last visit;  (2-110 deg flexion). He is able to ascend / descend stairs without assistance and minimal difficulty.  Pt's strength in RLE has improved as well as his FOTO score.  He has met all goals except his Rt knee ROM goal.  Pt is pleased with his level of function and requests to d/c therapy for his knee at this time.     Clinical Decision Making  Low    Rehab Potential  Good    PT Frequency  2x / week    PT Duration  12 weeks    PT Next Visit Plan  spoke to supervising PT; will d/c therapy for knee and continue therapy for back.     Consulted and Agree with Plan of Care  Patient;Family member/caregiver    Family Member Consulted  wife        Patient will benefit from skilled therapeutic intervention in order to improve the following deficits and impairments:     Visit Diagnosis: Acute pain of right knee  Other symptoms and signs involving the musculoskeletal system  Muscle weakness (generalized)     Problem List Patient Active Problem List   Diagnosis Date Noted  . Muscle spasm of back 04/22/2018  . Hypotension due to hypovolemia 04/22/2018  . Mid back pain on right side 04/22/2018  . Mild scoliosis 04/21/2018  . DDD (degenerative disc disease), cervical 04/21/2018  . DDD (degenerative disc disease), lumbar 04/21/2018  . Nasal polyp 07/02/2017  . Thrombocytopenia ([place]) 04/24/2016  . Elevated LFTs 02/17/2015  . Cerumen impaction 04/23/2014  . Prediabetes 04/20/2014  . Essential hypertension, benign 04/08/2014  . Hyperlipidemia 04/08/2014  . Habitual alcohol use 04/08/2014  . OA (osteoarthritis) of knee 04/27/2013   Patient will d/c treatment for TKA and patient will continue with independent HEP He will call with any questions or problems   Zachary Lyons, PTA 05/01/18 3:00 PM   Celyn P. Helene Kelp PT, MPH 05/01/18 5:36 PM   [place], [place], [address] Phone: [phone]   Fax:  [phone]  Name: Zachary Lyons MRN: [id number] Date of Birth: 1945/11/26

## 2018-05-06 ENCOUNTER — Ambulatory Visit (INDEPENDENT_AMBULATORY_CARE_PROVIDER_SITE_OTHER): Payer: Medicare HMO | Admitting: Osteopathic Medicine

## 2018-05-06 ENCOUNTER — Encounter: Payer: Self-pay | Admitting: Osteopathic Medicine

## 2018-05-06 VITALS — BP 136/88 | HR 67 | Temp 98.1°F | Wt 160.4 lb

## 2018-05-06 DIAGNOSIS — K219 Gastro-esophageal reflux disease without esophagitis: Secondary | ICD-10-CM

## 2018-05-06 DIAGNOSIS — I10 Essential (primary) hypertension: Secondary | ICD-10-CM | POA: Diagnosis not present

## 2018-05-06 DIAGNOSIS — I9589 Other hypotension: Secondary | ICD-10-CM

## 2018-05-06 DIAGNOSIS — E861 Hypovolemia: Secondary | ICD-10-CM

## 2018-05-06 MED ORDER — FAMOTIDINE 40 MG PO TABS: 40.0000 mg | ORAL_TABLET | Freq: Every day | ORAL | 1 refills | Status: DC

## 2018-05-06 MED ORDER — VALSARTAN-HYDROCHLOROTHIAZIDE 320-25 MG PO TABS: 1.0000 | ORAL_TABLET | Freq: Every day | ORAL | 1 refills | Status: DC

## 2018-05-06 MED ORDER — OMEPRAZOLE 40 MG PO CPDR: 40.0000 mg | DELAYED_RELEASE_CAPSULE | Freq: Every day | ORAL | 1 refills | Status: DC

## 2018-05-06 MED ORDER — RANITIDINE HCL 300 MG PO TABS: 300.0000 mg | ORAL_TABLET | Freq: Every day | ORAL | 1 refills | Status: DC

## 2018-05-06 NOTE — Patient Instructions (Addendum)
Plan:  For acid reflux, will continue the omeprazole, I sent an additional medicine called Pepcid/famotidine to add to it.  Follow-up as directed with your dentist.  For blood pressure: Make sure that you are taking the valsartan-hydrochlorothiazide 320-25 mg tablets once daily, as well as the metoprolol 50 mg 2 tablets once per day.  Bring all pill bottles with you to your next appointment, please bring your blood pressure monitor with you to your next appointment.  Let us recheck blood pressure in 1 week.

## 2018-05-06 NOTE — Progress Notes (Signed)
HPI: Zachary Lyons is a 72 y.o. male who  has a past medical history of Arthritis, GERD (gastroesophageal reflux disease), Hypertension, Nasal polyps, Peptic ulcer, Pre-diabetes, and Transfusion history (04-22-13).  he presents to [facility] today, 05/06/18,  for chief complaint of:  Blood pressure follow-up  Blood pressure medications recently reduced.  He saw a colleague of mine while I was out of the office.  Those records reviewed.  04/16/18: Presented to the office for acute visit hypotension and fatigue.  Physical therapy sent him over here given blood pressure, their measurement looks like 92/63.  Blood pressure was 106/62, pulse was 51.  Patient was recently postoperative from knee replacement 03/24/2018.  Decreased appetite, decreased fluid intake.  POC hemoglobin was normal range.  Blood pressure likely due to dehydration, discussed increase fluid intake, increase salt intake.  Metoprolol was decreased to 2 tablets daily, Diovan was decreased to 160-25 mg and patient was advised to come back in a few days for blood pressure recheck.  Visit 04/21/2018: Blood pressure 127/73.  Was feeling much better at that time.  Patient presents today with concerns for elevated blood pressure ever since medications have been decreased. 136/88 today on recheck after 5 mins sitting.  Wife is concerned because blood pressures at home were systolic up into the 180s.  Blood pressure monitor has not been verified.  Patient was having no chest pain, pressure, shortness of breath.  Currently feels fine.  [name] was checking her blood pressure and it was in the 110s, she was not rechecking his blood pressure after any high numbers.  No pill bottles with him today, they are not positive about which medications he is taking...   Also had some questions about acid reflux.  He does not really have any symptoms on the omeprazole, but he is having significant problems with  dental cavities and his dentist told him that this might be due to acid reflux.  He is not really brushing his teeth very often due to dental pain...      Past medical history, surgical history, and family history reviewed.  Current medication list and allergy/intolerance information reviewed.   (See remainder of HPI, ROS, Phys Exam below)     ASSESSMENT/PLAN:   Essential hypertension - BP a bit higher than previous but is around goal or at goal.   Hypotension due to hypovolemia - improved  Gastroesophageal reflux disease, esophagitis presence not specified - Plan: omeprazole (PRILOSEC) 40 MG capsule   Meds ordered this encounter  Medications  . omeprazole (PRILOSEC) 40 MG capsule    Sig: Take 1 capsule (40 mg total) by mouth daily.    Dispense:  90 capsule    Refill:  1  . DISCONTD: ranitidine (ZANTAC) 300 MG tablet    Sig: Take 1 tablet (300 mg total) by mouth at bedtime.    Dispense:  90 tablet    Refill:  1  . valsartan-hydrochlorothiazide (DIOVAN-HCT) 320-25 MG tablet    Sig: Take 1 tablet by mouth daily.    Dispense:  90 tablet    Refill:  1  . famotidine (PEPCID) 40 MG tablet    Sig: Take 1 tablet (40 mg total) by mouth daily.    Dispense:  90 tablet    Refill:  1    Please cancel ranitidine, thank you    Patient Instructions  Plan:  For acid reflux, will continue the omeprazole, I sent an additional medicine called Pepcid/famotidine to add to  it.  Follow-up as directed with your dentist.  For blood pressure: Make sure that you are taking the valsartan-hydrochlorothiazide 320-25 mg tablets once daily, as well as the metoprolol 50 mg 2 tablets once per day.  Bring all pill bottles with you to your next appointment, please bring your blood pressure monitor with you to your next appointment.  Let us recheck blood pressure in 1 week.    Follow-up plan: Return in about 1 week (around 05/13/2018) for Blood pressure follow-up with Dr.  Mervyn Skeeters.                 ############################################ ############################################ ############################################ ############################################    Outpatient Encounter Medications as of 05/06/2018  Medication Sig  . Azelastine-Fluticasone 137-50 MCG/ACT SUSP One spray each nostril BID  . clotrimazole-betamethasone (LOTRISONE) cream Apply 1 application topically 2 (two) times daily.  . diclofenac sodium (VOLTAREN) 1 % GEL Apply 4 g topically 4 (four) times daily. To affected joint.  . Hypertonic Nasal Wash (SINUS RINSE NA) Place 1 Dose into the nose daily.  . metoprolol succinate (TOPROL-XL) 50 MG 24 hr tablet Take 2 tablets (100 mg total) by mouth daily. Take with or immediately following a meal.  . omeprazole (PRILOSEC) 40 MG capsule TAKE 1 CAPSULE EVERY DAY (Patient taking differently: Take 40 mg by mouth daily. )  . simvastatin (ZOCOR) 10 MG tablet TAKE 1 TABLET AT BEDTIME (Patient taking differently: Take 10 mg by mouth at bedtime. )  . valsartan-hydrochlorothiazide (DIOVAN-HCT) 320-25 MG tablet TAKE 1 TABLET EVERY DAY (Patient taking differently: Take 0.5 tablets by mouth daily. )   No facility-administered encounter medications on file as of 05/06/2018.    No Known Allergies    Review of Systems:  Constitutional: No recent illness  HEENT: No  headache, no vision change  Cardiac: No  chest pain, No  pressure, No palpitations  Respiratory:  No  shortness of breath. No  Cough  Gastrointestinal: No  abdominal pain, no change on bowel habits  Hem/Onc: No  easy bruising/bleeding, No  abnormal lumps/bumps  Neurologic: No  weakness, No  Dizziness  Psychiatric: No  concerns with depression, No  concerns with anxiety  Exam:  BP 136/88 (BP Location: Left Arm, Patient Position: Sitting, Cuff Size: Normal)   Pulse 67   Temp 98.1 F (36.7 C) (Oral)   Wt 160 lb 6.4 oz (72.8 kg)   BMI 28.41 kg/m    Constitutional: VS see above. General Appearance: alert, well-developed, well-nourished, NAD  Eyes: Normal lids and conjunctive, non-icteric sclera  Ears, Nose, Mouth, Throat: MMM, Normal external inspection ears/nares/mouth/lips/gums.  Neck: No masses, trachea midline.   Respiratory: Normal respiratory effort. no wheeze, no rhonchi, no rales  Cardiovascular: S1/S2 normal, no murmur, no rub/gallop auscultated. RRR.   Musculoskeletal: Gait normal. Symmetric and independent movement of all extremities  Neurological: Normal balance/coordination. No tremor.  Skin: warm, dry, intact.   Psychiatric: Normal judgment/insight. Normal mood and affect. Oriented x3.   Visit summary with medication list and pertinent instructions was printed for patient to review, advised to alert [place] if any changes needed. All questions at time of visit were answered - patient instructed to contact office with any additional concerns. ER/RTC precautions were reviewed with the patient and understanding verbalized.   Follow-up plan: Return in about 1 week (around 05/13/2018) for Blood pressure follow-up with Dr. Mervyn Skeeters.    Please note: voice recognition software was used to produce this document, and typos may escape review. Please contact Dr. Lyn Hollingshead for any  needed clarifications.

## 2018-05-08 ENCOUNTER — Encounter: Payer: Self-pay | Admitting: Physical Therapy

## 2018-05-08 ENCOUNTER — Ambulatory Visit: Payer: Medicare HMO | Admitting: Physical Therapy

## 2018-05-08 DIAGNOSIS — M6281 Muscle weakness (generalized): Secondary | ICD-10-CM

## 2018-05-08 DIAGNOSIS — M546 Pain in thoracic spine: Secondary | ICD-10-CM

## 2018-05-08 NOTE — Therapy (Signed)
[facility] [place] [address] [place], [place], [address] Phone: [phone]   Fax:  [phone]  Physical Therapy Treatment  Patient Details  Name: Zachary Lyons MRN: [id number] Date of Birth: 24-Sep-1945 Referring Provider (PT): Tandy Gaw, [place]    Encounter Date: 05/08/2018  PT End of Session - 05/08/18 1233    Visit Number  11    Number of Visits  24    Date for PT Re-Evaluation  06/09/18    PT Start Time  1155   pt arrived late   PT Stop Time  1239   MHP last 10 min    PT Time Calculation (min)  44 min    Activity Tolerance  Patient tolerated treatment well    Behavior During Therapy  [facility] for tasks assessed/performed       Past Medical History:  Diagnosis Date  . Arthritis    osteoarthritis  . GERD (gastroesophageal reflux disease)   . Hypertension   . Nasal polyps    removed  . Peptic ulcer    hx. of  . Pre-diabetes   . Transfusion history 04-22-13   15 yrs ago-"bleeding ulcer"    Past Surgical History:  Procedure Laterality Date  . CATARACT EXTRACTION Left 04-22-13  . COLONOSCOPY    . JOINT REPLACEMENT     Right total knee arthroplasty  . NASAL SINUS SURGERY     polyps revoved 08-2017  . STOMACH SURGERY     oversew of bleeding ulcer  . TOTAL KNEE ARTHROPLASTY Left 04/27/2013   Procedure: LEFT TOTAL KNEE ARTHROPLASTY;  Surgeon: Loanne Drilling, MD;  Location: [facility];  Service: Orthopedics;  Laterality: Left;  . TOTAL KNEE ARTHROPLASTY Right 03/24/2018   Procedure: RIGHT TOTAL KNEE ARTHROPLASTY;  Surgeon: Ollen Gross, MD;  Location: [facility];  Service: Orthopedics;  Laterality: Right;    There were no vitals filed for this visit.  Subjective Assessment - 05/08/18 1158    Subjective  Pt reports his back usually hurts when he wakes up.  He has been doing some stretches and this eases the pain.      Pertinent History  Lt TKA 2014; pre diabetic; HTN; history of LBP x ~ 5 yrs     Patient Stated  Goals  use leg normally again     Currently in Pain?  No/denies    Pain Score  0-No pain    Pain Orientation  Right    Pain Descriptors / Indicators  Tightness         [facility] PT Assessment - 05/08/18 0001      Assessment   Medical Diagnosis  mid back pain     Referring Provider (PT)  Tandy Gaw, PA     Onset Date/Surgical Date  --   more than 2 years ago   Hand Dominance  Right       [facility] Adult PT Treatment/Exercise - 05/08/18 0001      Exercises   Exercises  Lumbar      Lumbar Exercises: Stretches   Passive Hamstring Stretch  Right;Left;2 reps;30 seconds   supine with strap   Lower Trunk Rotation  5 reps;10 seconds    Other Lumbar Stretch Exercise  3 position doorway stretch x 30 sec x 1 rep each     Other Lumbar Stretch Exercise  lateral trunk flexion to the Lt hand overhead  30 sec x 2 sitting on black mat.  Lat stretch with hands on railing x 20 sec x  3 reps        Lumbar Exercises: Aerobic   Nustep  L5: arms/ legs x 6.5 min       Lumbar Exercises: Standing   Scapular Retraction  Both;10 reps;Theraband   bilat ER, 2 sets   Theraband Level (Scapular Retraction)  Level 2 (Red)    Scapular Retraction Limitations  repeated cues to slow speed and for form    Row  Strengthening;15 reps;Theraband   2 sets   Theraband Level (Row)  Level 2 (Red)    Row Limitations  repeated cues to slow speed and for form    Shoulder Extension  Strengthening;Both;15 reps    Theraband Level (Shoulder Extension)  Level 2 (Red)    Shoulder Extension Limitations  repeated cues to slow speed and for form      Moist Heat Therapy   Number Minutes Moist Heat  10 Minutes    Moist Heat Location  Lumbar Spine   thoracic spine            PT Education - 05/08/18 1231    Education Details  HEP     Person(s) Educated  Patient;Spouse    Methods  Explanation;Handout;Verbal cues;Tactile cues;Demonstration    Comprehension  Verbalized understanding;Returned demonstration;Verbal cues  required;Tactile cues required          PT Long Term Goals - 05/08/18 1255      PT LONG TERM GOAL #4   Title  Independent in HEP 05/09/18    Period  Weeks    Status  Revised      PT LONG TERM GOAL #6   Title  Decrease pain Rt thoracic region by 50-75% allowing patient to patient to use UE's for functional activity 06/09/18    Time  6    Period  Weeks    Status  On-going      PT LONG TERM GOAL #7   Title  Improve thoracic posture and alignment with patient to demonstrate improved upright posture with posterior shoulder girdle engaged 06/09/18    Time  6    Period  Weeks            Plan - 05/08/18 1232    Clinical Impression Statement  Pt required multiple cues throughout to slow the speed of his exercises; also required freq cues for posture and form of exercise. Pt reported mild increase in     PT Treatment/Interventions  Patient/family education;ADLs/Self Care Home Management;Cryotherapy;Electrical Stimulation;Iontophoresis 4mg /ml Dexamethasone;Moist Heat;Ultrasound;Dry needling;Manual techniques;Neuromuscular re-education;Gait training;Stair training;Functional mobility training;Therapeutic activities;Therapeutic exercise;Taping    PT Next Visit Plan  review HEP and progress as tolerated.     Consulted and Agree with Plan of Care  Family member/caregiver;Patient    Family Member Consulted  wife        Patient will benefit from skilled therapeutic intervention in order to improve the following deficits and impairments:     Visit Diagnosis: Pain in thoracic spine  Muscle weakness (generalized)     Problem List Patient Active Problem List   Diagnosis Date Noted  . Muscle spasm of back 04/22/2018  . Hypotension due to hypovolemia 04/22/2018  . Mid back pain on right side 04/22/2018  . Mild scoliosis 04/21/2018  . DDD (degenerative disc disease), cervical 04/21/2018  . DDD (degenerative disc disease), lumbar 04/21/2018  . Nasal polyp 07/02/2017  .  Thrombocytopenia (HCC) 04/24/2016  . Elevated LFTs 02/17/2015  . Cerumen impaction 04/23/2014  . Prediabetes 04/20/2014  . Essential hypertension, benign 04/08/2014  . Hyperlipidemia 04/08/2014  .  Habitual alcohol use 04/08/2014  . OA (osteoarthritis) of knee 04/27/2013   Mayer Camel, PTA 05/08/18 12:58 PM  [facility] [place] [address] [place], [place], [address] Phone: [phone]   Fax:  [phone]  Name: Zachary Lyons MRN: [id number] Date of Birth: 1946-05-12

## 2018-05-08 NOTE — Patient Instructions (Signed)
  Scapula Adduction With Pectorals, Mid-Range   Stand in doorframe with palms against frame and arms at 90. Lean forward and squeeze shoulder blades. Hold __30_ seconds. Repeat _2__ times per session. Do _2__ sessions per day. \Scapula Adduction With Pectorals, High   Stand in doorframe with palms against frame and arms at 120. Lean forward and squeeze shoulder blades. Hold __30_ seconds. Repeat __2_ times per session. Do _2__ sessions per day.  Lower Trunk Rotation Stretch    Keeping back flat and feet together, rotate knees to left side. Hold __5__ seconds. Repeat __5-10__ times per set. Do __1__ sets per session. Do _1___ sessions per day.  Resisted External Rotation: in Neutral - Bilateral  PALMS UP!!! Sit or stand, tubing in both hands, elbows at sides, bent to 90, forearms forward. Pinch shoulder blades together and rotate forearms out. Keep elbows at sides. Repeat __10__ times per set. Do __2-3__ sets per session. Do __3__ sessions per week.  Resistive Band Rowing   With resistive band anchored in door, grasp both ends. Keeping elbows bent, pull back, squeezing shoulder blades together. Hold _3-5___ seconds. Repeat _10-30___ times. Do __3__ sessions per week  Strengthening: Resisted Extension   Hold tubing with both hands, arms forward. Pull arms back, elbow straight. Repeat _10-30___ times per set. Do ____ sets per session. Do _3___ sessions per week.  Lat Stretch, Standing    Stand and place palms against wall, shoulder-width apart. Lean upper body forward and push lower back outward. Hold _15-30__ seconds. Repeat _2__ times per session. Do _2__ sessions per day.   [facility] [address] [place], [place] [address]  [phone] (office) [phone] (fax)

## 2018-05-12 DIAGNOSIS — J3089 Other allergic rhinitis: Secondary | ICD-10-CM | POA: Diagnosis not present

## 2018-05-12 DIAGNOSIS — R05 Cough: Secondary | ICD-10-CM | POA: Diagnosis not present

## 2018-05-12 DIAGNOSIS — K219 Gastro-esophageal reflux disease without esophagitis: Secondary | ICD-10-CM | POA: Diagnosis not present

## 2018-05-12 DIAGNOSIS — Z8709 Personal history of other diseases of the respiratory system: Secondary | ICD-10-CM | POA: Diagnosis not present

## 2018-05-19 ENCOUNTER — Encounter: Payer: Self-pay | Admitting: Physical Therapy

## 2018-05-19 ENCOUNTER — Ambulatory Visit: Payer: Medicare HMO | Admitting: Physical Therapy

## 2018-05-19 DIAGNOSIS — M546 Pain in thoracic spine: Secondary | ICD-10-CM

## 2018-05-19 DIAGNOSIS — M6281 Muscle weakness (generalized): Secondary | ICD-10-CM | POA: Diagnosis not present

## 2018-05-19 NOTE — Therapy (Signed)
[facility] [place] [address] [place], [place], [address] Phone: [phone]   Fax:  [phone]  Physical Therapy Treatment  Patient Details  Name: Zachary Lyons MRN: [id number] Date of Birth: 1945/11/08 Referring Provider (PT): Tandy Gaw, [place]    Encounter Date: 05/19/2018  PT End of Session - 05/19/18 1154    Visit Number  12    Number of Visits  24    Date for PT Re-Evaluation  06/09/18    PT Start Time  1146    PT Stop Time  1233    PT Time Calculation (min)  47 min    Activity Tolerance  Patient tolerated treatment well    Behavior During Therapy  [facility] for tasks assessed/performed       Past Medical History:  Diagnosis Date  . Arthritis    osteoarthritis  . GERD (gastroesophageal reflux disease)   . Hypertension   . Nasal polyps    removed  . Peptic ulcer    hx. of  . Pre-diabetes   . Transfusion history 04-22-13   15 yrs ago-"bleeding ulcer"    Past Surgical History:  Procedure Laterality Date  . CATARACT EXTRACTION Left 04-22-13  . COLONOSCOPY    . JOINT REPLACEMENT     Right total knee arthroplasty  . NASAL SINUS SURGERY     polyps revoved 08-2017  . STOMACH SURGERY     oversew of bleeding ulcer  . TOTAL KNEE ARTHROPLASTY Left 04/27/2013   Procedure: LEFT TOTAL KNEE ARTHROPLASTY;  Surgeon: Loanne Drilling, MD;  Location: [facility];  Service: Orthopedics;  Laterality: Left;  . TOTAL KNEE ARTHROPLASTY Right 03/24/2018   Procedure: RIGHT TOTAL KNEE ARTHROPLASTY;  Surgeon: Ollen Gross, MD;  Location: [facility];  Service: Orthopedics;  Laterality: Right;    There were no vitals filed for this visit.  Subjective Assessment - 05/19/18 1308    Subjective  Pt reports his back is doing well.  He has been exercising in his garage.  Every 4 days, his back starts to bother him so he puts his back brace on.  His knee normally hurts after he's been sitting, but it eases once he gets moving.     Currently in  Pain?  No/denies    Pain Score  0-No pain         [facility] PT Assessment - 05/19/18 0001      Assessment   Medical Diagnosis  mid back pain     Referring Provider (PT)  Tandy Gaw, PA     Onset Date/Surgical Date  --   more than 2 years ago   Hand Dominance  Right       [facility] Adult PT Treatment/Exercise - 05/19/18 0001      Self-Care   Self-Care  Other Self-Care Comments    Other Self-Care Comments   Pt educated on self massage to thoracic / lumbar musculature with ball.  Pt verbalized understanding and returned demo with cues.       Exercises   Exercises  Lumbar      Lumbar Exercises: Stretches   Passive Hamstring Stretch  Right;Left;2 reps;30 seconds   supine with strap   Lower Trunk Rotation  5 reps;10 seconds    Other Lumbar Stretch Exercise  3 position doorway stretch x 30 sec x 1 rep each    multiple cues for proper form   Other Lumbar Stretch Exercise  lat stretch with arms on railing x 20 sec x 3reps  Lumbar Exercises: Standing   Scapular Retraction  Both;10 reps;Theraband    Theraband Level (Scapular Retraction)  Level 2 (Red)    Scapular Retraction Limitations  repeated cues to slow speed and for form    Row  Strengthening;Both;15 reps;Theraband    Theraband Level (Row)  Level 2 (Red)    Row Limitations  repeated cues to slow speed and for form    Shoulder Extension  Strengthening;Both;15 reps    Theraband Level (Shoulder Extension)  Level 2 (Red)    Shoulder Extension Limitations  repeated cues to slow speed and for form      Lumbar Exercises: Sidelying   Other Sidelying Lumbar Exercises  thoracic rotation to tolerance x 10 reps each side      Knee/Hip Exercises: Aerobic   Nustep  L5: arms/ legs x 6 min       Modalities   Modalities  --   declined; pain free, will do at home if needed        PT Long Term Goals - 05/08/18 1255      PT LONG TERM GOAL #4   Title  Independent in HEP 05/09/18    Period  Weeks    Status  Revised      PT LONG  TERM GOAL #6   Title  Decrease pain Rt thoracic region by 50-75% allowing patient to patient to use UE's for functional activity 06/09/18    Time  6    Period  Weeks    Status  On-going      PT LONG TERM GOAL #7   Title  Improve thoracic posture and alignment with patient to demonstrate improved upright posture with posterior shoulder girdle engaged 06/09/18    Time  6    Period  Weeks            Plan - 05/19/18 1219    Clinical Impression Statement  Pt continues to require multiple cues for proper form with exercises. he does much better with demonstration and visual cues.  Pt reported a "good feeling" with stretches throughout session.  Pt reported improved pain in back since starting therapy; unable to verbalize percentage of change.  Time spent educating pt on utilizing self care measures ( ie: stretches, heat, self massage) to ease pain vs immediate use of back brace and relying on medicine. Pt is progressing well towards remaining goals of therapy.      Rehab Potential  Good    PT Frequency  2x / week    PT Treatment/Interventions  Patient/family education;ADLs/Self Care Home Management;Cryotherapy;Electrical Stimulation;Iontophoresis 4mg /ml Dexamethasone;Moist Heat;Ultrasound;Dry needling;Manual techniques;Neuromuscular re-education;Gait training;Stair training;Functional mobility training;Therapeutic activities;Therapeutic exercise;Taping    PT Next Visit Plan  review HEP and progress as tolerated. - per pt's wife and pt, will hold after next visit     PT Home Exercise Plan  access code: [facility], and self massage with ball to paraspinals.     Consulted and Agree with Plan of Care  Patient;Family member/caregiver    Family Member Consulted  wife        Patient will benefit from skilled therapeutic intervention in order to improve the following deficits and impairments:  Pain, Increased fascial restricitons, Increased muscle spasms, Decreased mobility, Decreased range of motion,  Decreased strength, Abnormal gait, Decreased activity tolerance, Decreased balance  Visit Diagnosis: Pain in thoracic spine  Muscle weakness (generalized)     Problem List Patient Active Problem List   Diagnosis Date Noted  . Muscle spasm of back 04/22/2018  .  Hypotension due to hypovolemia 04/22/2018  . Mid back pain on right side 04/22/2018  . Mild scoliosis 04/21/2018  . DDD (degenerative disc disease), cervical 04/21/2018  . DDD (degenerative disc disease), lumbar 04/21/2018  . Nasal polyp 07/02/2017  . Thrombocytopenia (HCC) 04/24/2016  . Elevated LFTs 02/17/2015  . Cerumen impaction 04/23/2014  . Prediabetes 04/20/2014  . Essential hypertension, benign 04/08/2014  . Hyperlipidemia 04/08/2014  . Habitual alcohol use 04/08/2014  . OA (osteoarthritis) of knee 04/27/2013   Mayer Camel, PTA 05/19/18 1:11 PM  [facility] [place] [address] [place], [place], [address] Phone: [phone]   Fax:  [phone]  Name: Zachary Lyons MRN: [id number] Date of Birth: October 29, 1945

## 2018-05-22 ENCOUNTER — Encounter: Payer: Self-pay | Admitting: Rehabilitative and Restorative Service Providers"

## 2018-05-22 ENCOUNTER — Ambulatory Visit (INDEPENDENT_AMBULATORY_CARE_PROVIDER_SITE_OTHER): Payer: Medicare HMO | Admitting: Rehabilitative and Restorative Service Providers"

## 2018-05-22 ENCOUNTER — Encounter: Payer: Self-pay | Admitting: Osteopathic Medicine

## 2018-05-22 ENCOUNTER — Ambulatory Visit (INDEPENDENT_AMBULATORY_CARE_PROVIDER_SITE_OTHER): Payer: Medicare HMO | Admitting: Osteopathic Medicine

## 2018-05-22 VITALS — BP 117/55 | HR 55 | Temp 98.1°F | Wt 163.2 lb

## 2018-05-22 DIAGNOSIS — E878 Other disorders of electrolyte and fluid balance, not elsewhere classified: Secondary | ICD-10-CM | POA: Diagnosis not present

## 2018-05-22 DIAGNOSIS — M6281 Muscle weakness (generalized): Secondary | ICD-10-CM | POA: Diagnosis not present

## 2018-05-22 DIAGNOSIS — M546 Pain in thoracic spine: Secondary | ICD-10-CM | POA: Diagnosis not present

## 2018-05-22 DIAGNOSIS — R29898 Other symptoms and signs involving the musculoskeletal system: Secondary | ICD-10-CM | POA: Diagnosis not present

## 2018-05-22 DIAGNOSIS — I10 Essential (primary) hypertension: Secondary | ICD-10-CM | POA: Diagnosis not present

## 2018-05-22 NOTE — Patient Instructions (Addendum)
Blood pressure monitor that goes around the arm looks pretty accurate.  Home blood pressure goals are top number 110-140 and bottom number 50-90.   On review of labs, there was some abnormalities on your metabolic panel from a few months ago that we never rechecked, let us take another look at this today and make sure everything is going back to normal

## 2018-05-22 NOTE — Progress Notes (Signed)
HPI: Zachary Lyons is a 72 y.o. male who  has a past medical history of Arthritis, GERD (gastroesophageal reflux disease), Hypertension, Nasal polyps, Peptic ulcer, Pre-diabetes, and Transfusion history (04-22-13).  he presents to [facility] today, 05/22/18,  for chief complaint of:  BP recheck  Here to verify home blood pressure monitor, no other complaints today.  No chest pressure, shortness of breath, headache, dizziness.  Home blood pressure monitor that goes around the arm was 121/71, monitor that goes around the wrist was 138/75.  Systolic is pretty close on the over the arm monitor, patient is advised to disregard wrist monitor readings  Patient is accompanied by wife who assists with history-taking.      Past medical history, surgical history, and family history reviewed.  Current medication list and allergy/intolerance information reviewed.   (See remainder of HPI, ROS, Phys Exam below)  Labs reviewed from about a month ago, Nexium 3.1, calcium 8.4, never rechecked.     ASSESSMENT/PLAN:   Essential hypertension - Plan: COMPLETE METABOLIC PANEL WITH GFR  Electrolyte abnormality - Plan: COMPLETE METABOLIC PANEL WITH GFR   No orders of the defined types were placed in this encounter.   Patient Instructions  Blood pressure monitor that goes around the arm looks pretty accurate.  Home blood pressure goals are top number 110-140 and bottom number 50-90.   On review of labs, there was some abnormalities on your metabolic panel from a few months ago that we never rechecked, let us take another look at this today and make sure everything is going back to normal   Follow-up plan: Return in about 3 months (around 08/22/2018) for follow-up blood pressure  .                                    ############################################ ############################################ ############################################ ############################################    Outpatient Encounter Medications as of 05/22/2018  Medication Sig  . Azelastine-Fluticasone 137-50 MCG/ACT SUSP One spray each nostril BID  . clotrimazole-betamethasone (LOTRISONE) cream Apply 1 application topically 2 (two) times daily.  . diclofenac sodium (VOLTAREN) 1 % GEL Apply 4 g topically 4 (four) times daily. To affected joint.  . famotidine (PEPCID) 40 MG tablet Take 1 tablet (40 mg total) by mouth daily.  . Hypertonic Nasal Wash (SINUS RINSE NA) Place 1 Dose into the nose daily.  . metoprolol succinate (TOPROL-XL) 50 MG 24 hr tablet Take 2 tablets (100 mg total) by mouth daily. Take with or immediately following a meal.  . omeprazole (PRILOSEC) 40 MG capsule Take 1 capsule (40 mg total) by mouth daily.  . simvastatin (ZOCOR) 10 MG tablet TAKE 1 TABLET AT BEDTIME (Patient taking differently: Take 10 mg by mouth at bedtime. )  . valsartan-hydrochlorothiazide (DIOVAN-HCT) 320-25 MG tablet Take 1 tablet by mouth daily.   No facility-administered encounter medications on file as of 05/22/2018.    No Known Allergies    Review of Systems:  Constitutional: No recent illness  Cardiac: No  chest pain, No  pressure, No palpitations  Respiratory:  No  shortness of breath. No  Cough  Gastrointestinal: No  abdominal pain, no change on bowel habits  Neurologic: No  weakness, No  Dizziness  Psychiatric: No  concerns with depression, No  concerns with anxiety  Exam:  BP (!) 117/55 (BP Location: Left Arm, Patient Position: Sitting, Cuff Size: Normal)   Pulse (!) 55   Temp  98.1 F (36.7 C) (Oral)   Wt 163 lb 3.2 oz (74 kg)   BMI 28.91 kg/m   Constitutional: VS see above. General Appearance: alert, well-developed, well-nourished,  NAD  Eyes: Normal lids and conjunctive, non-icteric sclera  Ears, Nose, Mouth, Throat: MMM, Normal external inspection ears/nares/mouth/lips/gums.  Neck: No masses, trachea midline.   Respiratory: Normal respiratory effort. no wheeze, no rhonchi, no rales  Cardiovascular: S1/S2 normal, no murmur, no rub/gallop auscultated. RRR.   Musculoskeletal: Gait normal. Symmetric and independent movement of all extremities  Neurological: Normal balance/coordination. No tremor.  Skin: warm, dry, intact.   Psychiatric: Normal judgment/insight. Normal mood and affect. Oriented x3.   Visit summary with medication list and pertinent instructions was printed for patient to review, advised to alert [place] if any changes needed. All questions at time of visit were answered - patient instructed to contact office with any additional concerns. ER/RTC precautions were reviewed with the patient and understanding verbalized.   Follow-up plan: Return in about 3 months (around 08/22/2018) for follow-up blood pressure .  Note: Total time spent 15 minutes, greater than 50% of the visit was spent face-to-face counseling and coordinating care for the following: The primary encounter diagnosis was Essential hypertension. A diagnosis of Electrolyte abnormality was also pertinent to this visit.Marland Kitchen  Please note: voice recognition software was used to produce this document, and typos may escape review. Please contact Dr. Lyn Hollingshead for any needed clarifications.

## 2018-05-22 NOTE — Therapy (Addendum)
[place] [place], [place], [address] Phone: [phone]   Fax:  [phone]  Physical Therapy Treatment  Patient Details  Name: Zachary Lyons MRN: [id number] Date of Birth: [birth year] Referring Provider (PT): [place] Plan[place], [place]    Encounter Date: 05/22/2018  PT End of Session - 05/22/18 1159    Visit Number  13    Number of Visits  24    Date for PT Re-Evaluation  06/09/18    PT Start Time  7564    PT Stop Time  1242    PT Time Calculation (min)  46 min    Activity Tolerance  Patient tolerated treatment well       Past Medical History:  Diagnosis Date  . Arthritis    osteoarthritis  . GERD (gastroesophageal reflux disease)   . Hypertension   . Nasal polyps    removed  . Peptic ulcer    hx. of  . Pre-diabetes   . Transfusion history 04-22-13   15 yrs ago-"bleeding ulcer"    Past Surgical History:  Procedure Laterality Date  . CATARACT EXTRACTION Left 04-22-13  . COLONOSCOPY    . JOINT REPLACEMENT     Right total knee arthroplasty  . NASAL SINUS SURGERY     polyps revoved 08-2017  . STOMACH SURGERY     oversew of bleeding ulcer  . TOTAL KNEE ARTHROPLASTY Left 04/27/2013   Procedure: LEFT TOTAL KNEE ARTHROPLASTY;  Surgeon: Gearlean Alf, MD;  Location: [facility];  Service: Orthopedics;  Laterality: Left;  . TOTAL KNEE ARTHROPLASTY Right 03/24/2018   Procedure: RIGHT TOTAL KNEE ARTHROPLASTY;  Surgeon: Gaynelle Arabian, MD;  Location: [facility];  Service: Orthopedics;  Laterality: Right;    There were no vitals filed for this visit.  Subjective Assessment - 05/22/18 1206    Subjective  Pt reports his back is doing well.  He is exercising in his garage.  He uses his back brace at times.  His knee hurts after he's been sitting for a period of time, but moves more easily when he gets moving.     Currently in Pain?  No/denies    Pain Score  0                       [facility] Adult PT  Treatment/Exercise - 05/22/18 0001      Lumbar Exercises: Stretches   Passive Hamstring Stretch  Right;Left;2 reps;30 seconds   supine with strap   Double Knee to Chest Stretch  2 reps;20 seconds   knees flexed as tolerated    Double Knee to Chest Stretch Limitations  lat stretch 20 sec x 3 arms overhead     Lower Trunk Rotation  5 reps;10 seconds    Other Lumbar Stretch Exercise  3 position doorway stretch x 30 sec x 1 rep each    multiple cues for proper form   Other Lumbar Stretch Exercise  lat stretch with arms on railing x 20 sec x 3reps      Lumbar Exercises: Standing   Scapular Retraction  Both;10 reps;Theraband    Theraband Level (Scapular Retraction)  Level 2 (Red)    Scapular Retraction Limitations  cues to decrease speed     Row  Strengthening;Both;15 reps;Theraband    Theraband Level (Row)  Level 3 (Green)    Row Limitations  cues to decrease speed     Shoulder Extension  Strengthening;Both;15 reps    Theraband Level (Shoulder  Extension)  Level 3 (Green)    Shoulder Extension Limitations  cues to decrease speed       Lumbar Exercises: Sidelying   Other Sidelying Lumbar Exercises  thoracic rotation to tolerance x 10 reps each side      Knee/Hip Exercises: Aerobic   Nustep  L5: arms/ legs x 7 min                   PT Long Term Goals - 05/22/18 1200      PT LONG TERM GOAL #1   Title  Independent in ambulation including stairs with least restrictive assistive device to no assistive device  05/09/18    Time  6    Period  Weeks    Status  Achieved      PT LONG TERM GOAL #2   Title  Rt knee ROM 0 deg extension to 115 deg flexion 05/09/18    Time  6    Period  Weeks    Status  Not Met      PT LONG TERM GOAL #3   Title  4+/5 to 5/5 strength Rt LE 05/09/18    Time  6    Period  Weeks    Status  Achieved      PT LONG TERM GOAL #4   Title  Independent in HEP 05/09/18    Time  6    Period  Weeks    Status  Partially Met      PT LONG TERM GOAL #5    Title  Imrprove FOTO to </= 53% limitation 05/09/18    Time  6    Period  Weeks    Status  Achieved      PT LONG TERM GOAL #6   Title  Decrease pain Rt thoracic region by 50-75% allowing patient to patient to use UE's for functional activity 06/09/18    Time  6    Period  Weeks    Status  Achieved      PT LONG TERM GOAL #7   Title  Improve thoracic posture and alignment with patient to demonstrate improved upright posture with posterior shoulder girdle engaged 06/09/18    Time  6    Period  Weeks            Plan - 05/22/18 1200    Clinical Impression Statement  Patient reports that his back is doing well. He is not having pain. He is using the ball for self massage and is working on his exercises at home. He has accomplished most of the golas of rehab and is confident in continuing with his independent HEP. Patient will continue with HEP and call with any questions.     Rehab Potential  Good    PT Frequency  2x / week    PT Treatment/Interventions  Patient/family education;ADLs/Self Care Home Management;Cryotherapy;Electrical Stimulation;Iontophoresis 62m/ml Dexamethasone;Moist Heat;Ultrasound;Dry needling;Manual techniques;Neuromuscular re-education;Gait training;Stair training;Functional mobility training;Therapeutic activities;Therapeutic exercise;Taping    PT Next Visit Plan  D/C to independet HEP Patient will call with any concerns or questions     PT [facility] Exercise Plan  [facility] and self massage with ball to paraspinals.     Consulted and Agree with Plan of Care  Patient;Family member/caregiver    Family Member Consulted  wife        Patient will benefit from skilled therapeutic intervention in order to improve the following deficits and impairments:  Pain, Increased fascial restricitons, Increased muscle spasms, Decreased mobility,  Decreased range of motion, Decreased strength, Abnormal gait, Decreased activity tolerance, Decreased balance  Visit  Diagnosis: Pain in thoracic spine  Muscle weakness (generalized)  Other symptoms and signs involving the musculoskeletal system     Problem List Patient Active Problem List   Diagnosis Date Noted  . Muscle spasm of back 04/22/2018  . Hypotension due to hypovolemia 04/22/2018  . Mid back pain on right side 04/22/2018  . Mild scoliosis 04/21/2018  . DDD (degenerative disc disease), cervical 04/21/2018  . DDD (degenerative disc disease), lumbar 04/21/2018  . Nasal polyp 07/02/2017  . Thrombocytopenia ([place]) 04/24/2016  . Elevated LFTs 02/17/2015  . Cerumen impaction 04/23/2014  . Prediabetes 04/20/2014  . Essential hypertension, benign 04/08/2014  . Hyperlipidemia 04/08/2014  . Habitual alcohol use 04/08/2014  . OA (osteoarthritis) of knee 04/27/2013    Sharae Zappulla Nilda Simmer PT, MPH  05/22/2018, 12:38 PM  [facility] [place], [place], [address] Phone: [phone]   Fax:  [phone]  Name: Zachary Lyons MRN: [id number] Date of Birth: [birth year]  PHYSICAL THERAPY DISCHARGE SUMMARY  Visits from Start of Care: 13  Current functional level related to goals / functional outcomes: See progress note for discharge status   Remaining deficits: Unknown    Education / Equipment: HEP  Plan: Patient agrees to discharge.  Patient goals were met. Patient is being discharged due to meeting the stated rehab goals.  ?????    Letroy Vazguez P. Helene Kelp PT, MPH 06/24/18 12:32 PM

## 2018-05-23 LAB — COMPLETE METABOLIC PANEL WITH GFR
AG Ratio: 1.4 (calc) (ref 1.0–2.5)
ALBUMIN MSPROF: 4.1 g/dL (ref 3.6–5.1)
ALT: 10 U/L (ref 9–46)
AST: 18 U/L (ref 10–35)
Alkaline phosphatase (APISO): 116 U/L — ABNORMAL HIGH (ref 40–115)
BUN/Creatinine Ratio: 9 (calc) (ref 6–22)
BUN: 11 mg/dL (ref 7–25)
CALCIUM: 9 mg/dL (ref 8.6–10.3)
CO2: 30 mmol/L (ref 20–32)
CREATININE: 1.22 mg/dL — AB (ref 0.70–1.18)
Chloride: 106 mmol/L (ref 98–110)
GFR, EST AFRICAN AMERICAN: 68 mL/min/{1.73_m2} (ref >=60)
GFR, EST NON AFRICAN AMERICAN: 59 mL/min/{1.73_m2} — AB (ref >=60)
GLOBULIN: 3 g/dL (ref 1.9–3.7)
Glucose, Bld: 84 mg/dL (ref 65–99)
Potassium: 3.7 mmol/L (ref 3.5–5.3)
SODIUM: 146 mmol/L (ref 135–146)
TOTAL PROTEIN: 7.1 g/dL (ref 6.1–8.1)
Total Bilirubin: 0.5 mg/dL (ref 0.2–1.2)

## 2018-06-25 ENCOUNTER — Other Ambulatory Visit: Payer: Self-pay | Admitting: Family Medicine

## 2018-08-21 ENCOUNTER — Ambulatory Visit: Payer: Medicare HMO | Admitting: Osteopathic Medicine

## 2018-08-27 ENCOUNTER — Encounter: Payer: Self-pay | Admitting: Osteopathic Medicine

## 2018-08-27 ENCOUNTER — Ambulatory Visit (INDEPENDENT_AMBULATORY_CARE_PROVIDER_SITE_OTHER): Payer: Medicare HMO | Admitting: Osteopathic Medicine

## 2018-08-27 VITALS — BP 136/75 | HR 59 | Temp 97.7°F | Wt 172.2 lb

## 2018-08-27 DIAGNOSIS — R350 Frequency of micturition: Secondary | ICD-10-CM

## 2018-08-27 DIAGNOSIS — E118 Type 2 diabetes mellitus with unspecified complications: Secondary | ICD-10-CM

## 2018-08-27 DIAGNOSIS — I10 Essential (primary) hypertension: Secondary | ICD-10-CM | POA: Diagnosis not present

## 2018-08-27 DIAGNOSIS — R7303 Prediabetes: Secondary | ICD-10-CM

## 2018-08-27 LAB — POCT URINALYSIS DIPSTICK
BILIRUBIN UA: NEGATIVE
Blood, UA: NEGATIVE
GLUCOSE UA: NEGATIVE
Ketones, UA: NEGATIVE
Leukocytes, UA: NEGATIVE
Nitrite, UA: NEGATIVE
Protein, UA: NEGATIVE
Spec Grav, UA: <=1.005 — AB (ref 1.010–1.025)
Urobilinogen, UA: 0.2 E.U./dL
pH, UA: 6.5 (ref 5.0–8.0)

## 2018-08-27 LAB — POCT UA - MICROALBUMIN
Albumin/Creatinine Ratio, Urine, POC: NORMAL
Creatinine, POC: 100 mg/dL
MICROALBUMIN (UR) POC: 10 mg/L

## 2018-08-27 LAB — POCT GLYCOSYLATED HEMOGLOBIN (HGB A1C): HEMOGLOBIN A1C: 6.8 % — AB (ref 4.0–5.6)

## 2018-08-27 MED ORDER — METOPROLOL SUCCINATE ER 200 MG PO TB24: 200.0000 mg | ORAL_TABLET | Freq: Every day | ORAL | 1 refills | Status: DC

## 2018-08-27 MED ORDER — METFORMIN HCL ER 500 MG PO TB24: 500.0000 mg | ORAL_TABLET | Freq: Every day | ORAL | 1 refills | Status: DC

## 2018-08-27 NOTE — Patient Instructions (Signed)
We are adding a medicine today called metformin to keep the sugars under control and keep diabetes from getting worse.  He is cut back on sugars in the diet, and exercise as you are able.  We will check urine today for anything that might be causing frequency.  As long as urine is okay, no infection, may consider adding another medicine to help with bladder spasm.  Plan to see me again in the office in 3 months to follow-up on the sugars and the blood pressure.  If he would like to purchase a new home blood pressure monitor and bring it with you to this visit, that would be okay if you plan on checking blood pressures at home.

## 2018-08-27 NOTE — Progress Notes (Signed)
HPI: Zachary Lyons is a 73 y.o. male who  has a past medical history of Arthritis, GERD (gastroesophageal reflux disease), Hypertension, Nasal polyps, Peptic ulcer, Pre-diabetes, and Transfusion history (04-22-13).  he presents to [facility] today, 08/27/18,  for chief complaint of:  BP follow-up   HTN: No CP/SOB, BP today is at goal. Home monitor verified last visit 05/2018. Systolic was close on the arm monitor, diastolic was off. CMP last visit was ok, concern for CKD3 but appeared stable. Potassium was better.   Prediabetes: A1C today in diabetic range, this is second A1C 6.7 or higher.   Urinary frequency: ongoing for years, would like to get this checked while he's here.       At today's visit 08/27/18 ... PMH, PSH, FH reviewed and updated as needed.  Current medication list and allergy/intolerance hx reviewed and updated as needed. (See remainder of HPI, ROS, Phys Exam below)   No results found.  Results for orders placed or performed in visit on 08/27/18 (from the past 72 hour(s))  POCT HgB A1C     Status: Abnormal   Collection Time: 08/27/18  2:01 PM  Result Value Ref Range   Hemoglobin A1C 6.8 (A) 4.0 - 5.6 %   HbA1c POC (<> result, manual entry)     HbA1c, POC (prediabetic range)     HbA1c, POC (controlled diabetic range)    POCT UA - Microalbumin     Status: None   Collection Time: 08/27/18  2:24 PM  Result Value Ref Range   Microalbumin Ur, POC 10 mg/L   Creatinine, POC 100 mg/dL   Albumin/Creatinine Ratio, Urine, POC Normal     Comment: <30 mg/g  POCT Urinalysis Dipstick     Status: Abnormal   Collection Time: 08/27/18  2:25 PM  Result Value Ref Range   Color, UA yellow    Clarity, UA clear    Glucose, UA Negative Negative   Bilirubin, UA Negative    Ketones, UA Negative    Spec Grav, UA <=1.005 (A) 1.010 - 1.025   Blood, UA Negative    pH, UA 6.5 5.0 - 8.0   Protein, UA Negative Negative   Urobilinogen, UA  0.2 0.2 or 1.0 E.U./dL   Nitrite, UA Negative    Leukocytes, UA Negative Negative   Appearance     Odor            ASSESSMENT/PLAN: The primary encounter diagnosis was Controlled type 2 diabetes mellitus with complication, without long-term current use of insulin (HCC). Diagnoses of Pre-diabetes, Essential hypertension, and Urine frequency were also pertinent to this visit.   Orders Placed This Encounter  Procedures  . POCT HgB A1C  . POCT UA - Microalbumin  . POCT Urinalysis Dipstick     Meds ordered this encounter  Medications  . metFORMIN (GLUCOPHAGE-XR) 500 MG 24 hr tablet    Sig: Take 1 tablet (500 mg total) by mouth daily with breakfast.    Dispense:  90 tablet    Refill:  1  . metoprolol (TOPROL-XL) 200 MG 24 hr tablet    Sig: Take 1 tablet (200 mg total) by mouth daily.    Dispense:  90 tablet    Refill:  1    Patient Instructions  We are adding a medicine today called metformin to keep the sugars under control and keep diabetes from getting worse.  He is cut back on sugars in the diet, and exercise as you are able.  We will check urine today for anything that might be causing frequency.  As long as urine is okay, no infection, may consider adding another medicine to help with bladder spasm.  Plan to see me again in the office in 3 months to follow-up on the sugars and the blood pressure.  If he would like to purchase a new home blood pressure monitor and bring it with you to this visit, that would be okay if you plan on checking blood pressures at home.       Follow-up plan: Return in about 3 months (around 11/25/2018) for A1C - SUGAR MONITORING - sooner if  needed.                                                 ################################################# ################################################# ################################################# #################################################    Current Meds  Medication Sig  . Azelastine-Fluticasone 137-50 MCG/ACT SUSP One spray each nostril BID  . clotrimazole-betamethasone (LOTRISONE) cream Apply 1 application topically 2 (two) times daily.  . diclofenac sodium (VOLTAREN) 1 % GEL Apply 4 g topically 4 (four) times daily. To affected joint.  . famotidine (PEPCID) 40 MG tablet Take 1 tablet (40 mg total) by mouth daily.  . Hypertonic Nasal Wash (SINUS RINSE NA) Place 1 Dose into the nose daily.  . metoprolol (TOPROL-XL) 200 MG 24 hr tablet Take 1 tablet (200 mg total) by mouth daily.  Marland Kitchen. omeprazole (PRILOSEC) 40 MG capsule Take 1 capsule (40 mg total) by mouth daily.  . simvastatin (ZOCOR) 10 MG tablet TAKE 1 TABLET AT BEDTIME (Patient taking differently: Take 10 mg by mouth at bedtime. )  . valsartan-hydrochlorothiazide (DIOVAN-HCT) 320-25 MG tablet Take 1 tablet by mouth daily.  . [DISCONTINUED] metoprolol (TOPROL-XL) 200 MG 24 hr tablet Take 200 mg by mouth daily.    No Known Allergies     Review of Systems:  Constitutional: No recent illness  HEENT: No  headache  Cardiac: No  chest pain  Respiratory:  No  shortness of breath.   Gastrointestinal: No  abdominal pain   Exam:  BP 136/75 (BP Location: Left Arm, Patient Position: Sitting, Cuff Size: Normal)   Pulse (!) 59   Temp 97.7 F (36.5 C) (Oral)   Wt 172 lb 3.2 oz (78.1 kg)   BMI 30.50 kg/m   Constitutional: VS see above. General Appearance: alert, well-developed, well-nourished, NAD  Eyes: Normal lids and conjunctive, non-icteric sclera  Ears, Nose, Mouth, Throat: MMM, Normal external inspection ears/nares/mouth/lips/gums.  Neck: No masses, trachea  midline.   Respiratory: Normal respiratory effort. no wheeze, no rhonchi, no rales  Cardiovascular: S1/S2 normal, no murmur, no rub/gallop auscultated. RRR.   Musculoskeletal: Gait normal. Symmetric and independent movement of all extremities  Neurological: Normal balance/coordination. No tremor.  Skin: warm, dry, intact.   Psychiatric: Normal judgment/insight. Normal mood and affect. Oriented x3.       Visit summary with medication list and pertinent instructions was printed for patient to review, patient was advised to alert us if any updates are needed. All questions at time of visit were answered - patient instructed to contact office with any additional concerns. ER/RTC precautions were reviewed with the patient and understanding verbalized.     Please note: voice recognition software was used to produce this document, and typos may escape review. Please contact Dr. Lyn HollingsheadAlexander for any needed clarifications.  Follow up plan: Return in about 3 months (around 11/25/2018) for A1C - SUGAR MONITORING - sooner if needed.

## 2018-09-03 ENCOUNTER — Other Ambulatory Visit: Payer: Self-pay | Admitting: Osteopathic Medicine

## 2018-09-03 ENCOUNTER — Other Ambulatory Visit: Payer: Self-pay | Admitting: Family Medicine

## 2018-09-03 DIAGNOSIS — M62830 Muscle spasm of back: Secondary | ICD-10-CM

## 2018-09-03 DIAGNOSIS — M549 Dorsalgia, unspecified: Secondary | ICD-10-CM

## 2018-09-03 MED ORDER — DICLOFENAC SODIUM 1 % TD GEL: 4.0000 g | Freq: Four times a day (QID) | TRANSDERMAL | 1 refills | Status: DC

## 2018-09-29 DIAGNOSIS — I10 Essential (primary) hypertension: Secondary | ICD-10-CM | POA: Diagnosis not present

## 2018-09-29 DIAGNOSIS — H524 Presbyopia: Secondary | ICD-10-CM | POA: Diagnosis not present

## 2018-09-29 DIAGNOSIS — E7800 Pure hypercholesterolemia, unspecified: Secondary | ICD-10-CM | POA: Diagnosis not present

## 2018-09-29 DIAGNOSIS — E139 Other specified diabetes mellitus without complications: Secondary | ICD-10-CM | POA: Diagnosis not present

## 2018-11-26 ENCOUNTER — Other Ambulatory Visit: Payer: Self-pay | Admitting: Osteopathic Medicine

## 2018-11-26 ENCOUNTER — Ambulatory Visit: Payer: Medicare HMO | Admitting: Osteopathic Medicine

## 2018-11-26 DIAGNOSIS — E785 Hyperlipidemia, unspecified: Secondary | ICD-10-CM

## 2018-12-02 ENCOUNTER — Encounter: Payer: Self-pay | Admitting: Osteopathic Medicine

## 2018-12-02 ENCOUNTER — Ambulatory Visit (INDEPENDENT_AMBULATORY_CARE_PROVIDER_SITE_OTHER): Payer: Medicare HMO | Admitting: Osteopathic Medicine

## 2018-12-02 VITALS — BP 168/87 | HR 60 | Temp 98.1°F | Wt 165.5 lb

## 2018-12-02 DIAGNOSIS — E118 Type 2 diabetes mellitus with unspecified complications: Secondary | ICD-10-CM | POA: Diagnosis not present

## 2018-12-02 DIAGNOSIS — I10 Essential (primary) hypertension: Secondary | ICD-10-CM | POA: Diagnosis not present

## 2018-12-02 DIAGNOSIS — R350 Frequency of micturition: Secondary | ICD-10-CM

## 2018-12-02 LAB — POCT GLYCOSYLATED HEMOGLOBIN (HGB A1C): Hemoglobin A1C: 6.7 % — AB (ref 4.0–5.6)

## 2018-12-02 MED ORDER — TAMSULOSIN HCL 0.4 MG PO CAPS: 0.4000 mg | ORAL_CAPSULE | Freq: Every day | ORAL | 1 refills | Status: DC

## 2018-12-02 NOTE — Patient Instructions (Addendum)
Plan:  See medication list attached. Please make sure your home medicines match what we have listed in your chart!   Monitor blood pressure at home. Goal 140/90 or less. Your home blood pressure monitor was not totally accurate at last visit, but it's probably better than nothing.   For thirst - limit water in the few hours before bed time. Try citrus hard candies or lozenges to stimulate salivation, or even can suck on small pieces of lemon.   Let me know if you want to try a medicine for the urination, but let's try limiting bedtime water intake first.

## 2018-12-02 NOTE — Progress Notes (Signed)
HPI: Zachary Lyons is a 73 y.o. male who  has a past medical history of Arthritis, GERD (gastroesophageal reflux disease), Hypertension, Nasal polyps, Peptic ulcer, Pre-diabetes, and Transfusion history (04-22-13).  he presents to [facility] today, 12/02/18,  for chief complaint of:  DM2 and BP follow-up   HTN: BP elevated today, last BP in office was ok. Pt reports meds as below. NO CP/SOB.  BP Readings from Last 3 Encounters:  12/02/18 (!) 168/87  08/27/18 136/75  05/22/18 (!) 117/55   Urinary: Reports still urinating frequently, has normal stream, no urgency. Reports drinking more than a gallon of water per day. Using throat lozenges to help with thirst.   DM2: Reports taking metformin daily, no adverse effects.      At today's visit 12/02/18 ... PMH, PSH, FH reviewed and updated as needed.  Current medication list and allergy/intolerance hx reviewed and updated as needed. (See remainder of HPI, ROS, Phys Exam below)   No results found.  No results found for this or any previous visit (from the past 72 hour(s)).        ASSESSMENT/PLAN: The primary encounter diagnosis was Controlled type 2 diabetes mellitus with complication, without long-term current use of insulin (HCC). Diagnoses of Essential hypertension and Urine frequency were also pertinent to this visit.   Sugars stable  HTN not controlled, was WNL last visit, pt encouraged monitor at home bt would advise new BP cuff and have this verified in our office   Will trial Flomax, pt advised slow onset of action   Orders Placed This Encounter  Procedures  . POCT HgB A1C     Meds ordered this encounter  Medications  . tamsulosin (FLOMAX) 0.4 MG CAPS capsule    Sig: Take 1 capsule (0.4 mg total) by mouth daily.    Dispense:  90 capsule    Refill:  1    Patient Instructions  Plan:  See medication list attached. Please make sure your home medicines match what  we have listed in your chart!   Monitor blood pressure at home. Goal 140/90 or less. Your home blood pressure monitor was not totally accurate at last visit, but it's probably better than nothing.   For thirst - limit water in the few hours before bed time. Try citrus hard candies or lozenges to stimulate salivation, or even can suck on small pieces of lemon.   Let me know if you want to try a medicine for the urination, but let's try limiting bedtime water intake first.         Follow-up plan: Return in about 3 months (around 03/04/2019) for recheck sugars and blood pressure .                                                 ################################################# ################################################# ################################################# #################################################    No outpatient medications have been marked as taking for the 12/02/18 encounter (Office Visit) with Sunnie Nielsen, DO.    No Known Allergies     Review of Systems:  Constitutional: No recent illness  HEENT: No  headache, no vision change  Cardiac: No  chest pain, No  pressure, No palpitations  Respiratory:  No  shortness of breath. No  Cough  Gastrointestinal: No  abdominal pain, no change on bowel habits  Musculoskeletal: No new myalgia/arthralgia  Skin: No  Rash  Hem/Onc: No  easy bruising/bleeding, No  abnormal lumps/bumps  Neurologic: No  weakness, No  Dizziness  Psychiatric: No  concerns with depression, No  concerns with anxiety  Exam:  BP (!) 168/87 (BP Location: Right Arm, Patient Position: Sitting, Cuff Size: Normal)   Pulse 60   Temp 98.1 F (36.7 C) (Oral)   Wt 165 lb 8 oz (75.1 kg)   BMI 29.32 kg/m   Constitutional: VS see above. General Appearance: alert, well-developed, well-nourished, NAD  Eyes: Normal lids and conjunctive, non-icteric sclera  Ears, Nose, Mouth,  Throat: MMM, Normal external inspection ears/nares/mouth/lips/gums.  Neck: No masses, trachea midline.   Respiratory: Normal respiratory effort. no wheeze, no rhonchi, no rales  Cardiovascular: S1/S2 normal, no murmur, no rub/gallop auscultated. RRR.   Musculoskeletal: Gait normal. Symmetric and independent movement of all extremities  Neurological: Normal balance/coordination. No tremor.  Skin: warm, dry, intact.   Psychiatric: Normal judgment/insight. Normal mood and affect. Oriented x3.       Visit summary with medication list and pertinent instructions was printed for patient to review, patient was advised to alert us if any updates are needed. All questions at time of visit were answered - patient instructed to contact office with any additional concerns. ER/RTC precautions were reviewed with the patient and understanding verbalized.   Note: Total time spent 25 minutes, greater than 50% of the visit was spent face-to-face counseling and coordinating care for the following: The primary encounter diagnosis was Controlled type 2 diabetes mellitus with complication, without long-term current use of insulin (HCC). Diagnoses of Essential hypertension and Urine frequency were also pertinent to this visit.Marland Kitchen.  Please note: voice recognition software was used to produce this document, and typos may escape review. Please contact Dr. Lyn HollingsheadAlexander for any needed clarifications.    Follow up plan: Return in about 3 months (around 03/04/2019) for recheck sugars and blood pressure .

## 2018-12-12 ENCOUNTER — Other Ambulatory Visit: Payer: Self-pay | Admitting: Osteopathic Medicine

## 2018-12-12 DIAGNOSIS — M62830 Muscle spasm of back: Secondary | ICD-10-CM

## 2018-12-12 DIAGNOSIS — K219 Gastro-esophageal reflux disease without esophagitis: Secondary | ICD-10-CM

## 2018-12-12 DIAGNOSIS — M549 Dorsalgia, unspecified: Secondary | ICD-10-CM

## 2018-12-24 ENCOUNTER — Other Ambulatory Visit: Payer: Self-pay | Admitting: Family Medicine

## 2019-01-01 ENCOUNTER — Other Ambulatory Visit: Payer: Self-pay | Admitting: Osteopathic Medicine

## 2019-01-01 NOTE — Telephone Encounter (Signed)
Forwarding medication refill to PCP for review. 

## 2019-01-28 ENCOUNTER — Other Ambulatory Visit: Payer: Self-pay | Admitting: Family Medicine

## 2019-02-06 ENCOUNTER — Other Ambulatory Visit: Payer: Self-pay | Admitting: Osteopathic Medicine

## 2019-02-06 DIAGNOSIS — M549 Dorsalgia, unspecified: Secondary | ICD-10-CM

## 2019-02-06 DIAGNOSIS — M62830 Muscle spasm of back: Secondary | ICD-10-CM

## 2019-02-06 NOTE — Telephone Encounter (Signed)
Forwarding medication refill requests to PCP for review. 

## 2019-02-26 ENCOUNTER — Ambulatory Visit: Payer: Medicare HMO | Admitting: Osteopathic Medicine

## 2019-03-06 ENCOUNTER — Encounter: Payer: Self-pay | Admitting: Osteopathic Medicine

## 2019-03-06 ENCOUNTER — Other Ambulatory Visit: Payer: Self-pay

## 2019-03-06 ENCOUNTER — Ambulatory Visit (INDEPENDENT_AMBULATORY_CARE_PROVIDER_SITE_OTHER): Payer: Medicare HMO | Admitting: Osteopathic Medicine

## 2019-03-06 VITALS — BP 144/69 | HR 51 | Temp 97.9°F | Wt 161.1 lb

## 2019-03-06 DIAGNOSIS — E118 Type 2 diabetes mellitus with unspecified complications: Secondary | ICD-10-CM | POA: Diagnosis not present

## 2019-03-06 DIAGNOSIS — G4489 Other headache syndrome: Secondary | ICD-10-CM | POA: Diagnosis not present

## 2019-03-06 LAB — POCT GLYCOSYLATED HEMOGLOBIN (HGB A1C): Hemoglobin A1C: 6.3 % — AB (ref 4.0–5.6)

## 2019-03-06 NOTE — Progress Notes (Signed)
HPI: Zachary Lyons is a 73 y.o. male who  has a past medical history of Arthritis, GERD (gastroesophageal reflux disease), Hypertension, Nasal polyps, Peptic ulcer, Pre-diabetes, and Transfusion history (04-22-13).  he presents to [facility] today, 03/06/19,  for chief complaint of:  Diabetes follow-up   DIABETES SCREENING/PREVENTIVE CARE: A1C past 3-6 mos: Yes  controlled? Yes   03/19/18: 6.6  08/27/18: 6.8  12/02/18: 6.7  Today 03/06/19: 6.3  BP goal <130/80: No  cannot confirm his meds... again!  BP Readings from Last 3 Encounters:  03/06/19 (!) 144/69  12/02/18 (!) 168/87  08/27/18 136/75   LDL goal <70: needs lipid panel Eye exam annually: none on file, importance discussed with patient Foot exam: No  Microalbuminuria:n/a on ARB Metformin: Yes  ACE/ARB: Yes  Antiplatelet if ASCVD Risk >10%: needs Statin: Yes  Pneumovax: Needs but declined   There is no immunization history on file for this patient.    Headaches: mild headache only when he works outside, gets better after taking an aspirin, no lightheadedness, no vision changes. Wife notes no cognitive changes.   Patient is accompanied by wife who assists with history-taking.        At today's visit 03/06/19 ... PMH, PSH, FH reviewed and updated as needed.  Current medication list and allergy/intolerance hx reviewed and updated as needed. (See remainder of HPI, ROS, Phys Exam below)   No results found.  No results found for this or any previous visit (from the past 72 hour(s)).        ASSESSMENT/PLAN: The primary encounter diagnosis was Controlled type 2 diabetes mellitus with complication, without long-term current use of insulin (HCC). A diagnosis of Other headache syndrome was also pertinent to this visit.  No changes at this time C/o mild headaches only when he works outside, advised hydration and f/u if not better   Orders Placed This Encounter   Procedures  . CBC  . COMPLETE METABOLIC PANEL WITH GFR  . Lipid panel  . POCT HgB A1C     No orders of the defined types were placed in this encounter.   There are no Patient Instructions on file for this visit.    Follow-up plan: Return in about 4 months (around 07/06/2019) for monitor sugars, office visit ok - see me sooner if needed / if headaches don't get better! .                                                 ################################################# ################################################# ################################################# #################################################    No outpatient medications have been marked as taking for the 03/06/19 encounter (Office Visit) with Sunnie NielsenAlexander, Bliss Tsang, DO.    No Known Allergies     Review of Systems:  Constitutional: No recent illness, no fever   HEENT: +headache, no vision change  Cardiac: No  chest pain, No  pressure, No palpitations  Respiratory:  No  shortness of breath. No  Cough  Gastrointestinal: No  abdominal pain  Musculoskeletal: No new myalgia/arthralgia  Skin: No  Rash  Neurologic: No  weakness, No  Dizziness   Exam:  BP (!) 144/69 (BP Location: Left Arm, Patient Position: Sitting, Cuff Size: Normal)   Pulse (!) 51   Temp 97.9 F (36.6 C) (Oral)   Wt 161 lb 1.6 oz (73.1 kg)   BMI 28.54 kg/m   Constitutional:  VS see above. General Appearance: alert, well-developed, well-nourished, NAD  Eyes: Normal lids and conjunctive, non-icteric sclera  Ears, Nose, Mouth, Throat: MMM, Normal external inspection ears/nares/mouth/lips/gums.  Neck: No masses, trachea midline.   Respiratory: Normal respiratory effort. no wheeze, no rhonchi, no rales  Cardiovascular: S1/S2 normal, no murmur, no rub/gallop auscultated. RRR.   Musculoskeletal: Gait normal. Symmetric and independent movement of all extremities  Neurological:  Normal balance/coordination. No tremor.  Skin: warm, dry, intact.   Psychiatric: Normal judgment/insight. Normal mood and affect. Oriented x3.       Visit summary with medication list and pertinent instructions was printed for patient to review, patient was advised to alert [place] if any updates are needed. All questions at time of visit were answered - patient instructed to contact office with any additional concerns. ER/RTC precautions were reviewed with the patient and understanding verbalized.    Please note: voice recognition software was used to produce this document, and typos may escape review. Please contact Dr. Sheppard Coil for any needed clarifications.    Follow up plan: Return in about 4 months (around 07/06/2019) for monitor sugars, office visit ok - see me sooner if needed / if headaches don't get better! Marland Kitchen

## 2019-03-07 LAB — CBC
HCT: 44 % (ref 38.5–50.0)
Hemoglobin: 14.1 g/dL (ref 13.2–17.1)
MCH: 28.6 pg (ref 27.0–33.0)
MCHC: 32 g/dL (ref 32.0–36.0)
MCV: 89.2 fL (ref 80.0–100.0)
MPV: 9 fL (ref 7.5–12.5)
Platelets: 185 10*3/uL (ref 140–400)
RBC: 4.93 10*6/uL (ref 4.20–5.80)
RDW: 14.7 % (ref 11.0–15.0)
WBC: 5.7 10*3/uL (ref 3.8–10.8)

## 2019-03-07 LAB — COMPLETE METABOLIC PANEL WITH GFR
AG Ratio: 1.5 (calc) (ref 1.0–2.5)
ALT: 26 U/L (ref 9–46)
AST: 39 U/L — ABNORMAL HIGH (ref 10–35)
Albumin: 4.5 g/dL (ref 3.6–5.1)
Alkaline phosphatase (APISO): 82 U/L (ref 35–144)
BUN: 9 mg/dL (ref 7–25)
CO2: 27 mmol/L (ref 20–32)
Calcium: 9.5 mg/dL (ref 8.6–10.3)
Chloride: 98 mmol/L (ref 98–110)
Creat: 1.07 mg/dL (ref 0.70–1.18)
GFR, Est African American: 79 mL/min/{1.73_m2} (ref >=60)
GFR, Est Non African American: 68 mL/min/{1.73_m2} (ref >=60)
Globulin: 3 g/dL (calc) (ref 1.9–3.7)
Glucose, Bld: 112 mg/dL — ABNORMAL HIGH (ref 65–99)
Potassium: 3.9 mmol/L (ref 3.5–5.3)
Sodium: 137 mmol/L (ref 135–146)
Total Bilirubin: 0.8 mg/dL (ref 0.2–1.2)
Total Protein: 7.5 g/dL (ref 6.1–8.1)

## 2019-03-07 LAB — LIPID PANEL
Cholesterol: 166 mg/dL (ref <200)
HDL: 80 mg/dL (ref >=40)
LDL Cholesterol (Calc): 71 mg/dL (calc)
Non-HDL Cholesterol (Calc): 86 mg/dL (calc) (ref <130)
Total CHOL/HDL Ratio: 2.1 (calc) (ref <5.0)
Triglycerides: 69 mg/dL (ref <150)

## 2019-04-24 ENCOUNTER — Other Ambulatory Visit: Payer: Self-pay | Admitting: Osteopathic Medicine

## 2019-04-24 DIAGNOSIS — K219 Gastro-esophageal reflux disease without esophagitis: Secondary | ICD-10-CM

## 2019-04-24 DIAGNOSIS — E785 Hyperlipidemia, unspecified: Secondary | ICD-10-CM

## 2019-06-09 ENCOUNTER — Other Ambulatory Visit: Payer: Self-pay | Admitting: Osteopathic Medicine

## 2019-06-10 NOTE — Telephone Encounter (Signed)
Must make appointment 

## 2019-06-19 ENCOUNTER — Other Ambulatory Visit: Payer: Self-pay

## 2019-06-19 MED ORDER — METOPROLOL SUCCINATE ER 50 MG PO TB24: ORAL_TABLET | ORAL | 0 refills | Status: DC

## 2019-06-24 ENCOUNTER — Other Ambulatory Visit: Payer: Self-pay | Admitting: Osteopathic Medicine

## 2019-06-24 NOTE — Telephone Encounter (Signed)
Forwarding medication refill request to the clinical pool for review. 

## 2019-06-29 ENCOUNTER — Other Ambulatory Visit: Payer: Self-pay | Admitting: Osteopathic Medicine

## 2019-09-01 ENCOUNTER — Other Ambulatory Visit: Payer: Self-pay | Admitting: Osteopathic Medicine

## 2019-09-22 ENCOUNTER — Ambulatory Visit (INDEPENDENT_AMBULATORY_CARE_PROVIDER_SITE_OTHER): Payer: Medicare HMO | Admitting: Nurse Practitioner

## 2019-09-22 ENCOUNTER — Encounter: Payer: Self-pay | Admitting: Nurse Practitioner

## 2019-09-22 ENCOUNTER — Other Ambulatory Visit: Payer: Self-pay

## 2019-09-22 VITALS — BP 147/80 | HR 62 | Temp 98.2°F | Ht 63.0 in | Wt 161.0 lb

## 2019-09-22 DIAGNOSIS — I10 Essential (primary) hypertension: Secondary | ICD-10-CM | POA: Diagnosis not present

## 2019-09-22 DIAGNOSIS — E785 Hyperlipidemia, unspecified: Secondary | ICD-10-CM | POA: Diagnosis not present

## 2019-09-22 DIAGNOSIS — K219 Gastro-esophageal reflux disease without esophagitis: Secondary | ICD-10-CM

## 2019-09-22 DIAGNOSIS — E118 Type 2 diabetes mellitus with unspecified complications: Secondary | ICD-10-CM

## 2019-09-22 DIAGNOSIS — R7989 Other specified abnormal findings of blood chemistry: Secondary | ICD-10-CM

## 2019-09-22 DIAGNOSIS — M5030 Other cervical disc degeneration, unspecified cervical region: Secondary | ICD-10-CM

## 2019-09-22 DIAGNOSIS — Z7289 Other problems related to lifestyle: Secondary | ICD-10-CM | POA: Diagnosis not present

## 2019-09-22 DIAGNOSIS — L309 Dermatitis, unspecified: Secondary | ICD-10-CM | POA: Diagnosis not present

## 2019-09-22 DIAGNOSIS — R399 Unspecified symptoms and signs involving the genitourinary system: Secondary | ICD-10-CM | POA: Diagnosis not present

## 2019-09-22 MED ORDER — SIMVASTATIN 10 MG PO TABS: 10.0000 mg | ORAL_TABLET | Freq: Every day | ORAL | 1 refills | Status: DC

## 2019-09-22 MED ORDER — METOPROLOL SUCCINATE ER 50 MG PO TB24: 150.0000 mg | ORAL_TABLET | Freq: Every day | ORAL | 1 refills | Status: DC

## 2019-09-22 MED ORDER — CLOTRIMAZOLE-BETAMETHASONE 1-0.05 % EX CREA: 1.0000 "application " | TOPICAL_CREAM | Freq: Two times a day (BID) | CUTANEOUS | 3 refills | Status: DC

## 2019-09-22 MED ORDER — FAMOTIDINE 40 MG PO TABS: 40.0000 mg | ORAL_TABLET | Freq: Every day | ORAL | 1 refills | Status: DC

## 2019-09-22 MED ORDER — OMEPRAZOLE 40 MG PO CPDR: 40.0000 mg | DELAYED_RELEASE_CAPSULE | Freq: Every day | ORAL | 1 refills | Status: DC

## 2019-09-22 MED ORDER — VALSARTAN-HYDROCHLOROTHIAZIDE 320-25 MG PO TABS: 1.0000 | ORAL_TABLET | Freq: Every day | ORAL | 1 refills | Status: DC

## 2019-09-22 MED ORDER — DICLOFENAC SODIUM 1 % EX GEL: 2.0000 g | Freq: Four times a day (QID) | CUTANEOUS | 11 refills | Status: DC

## 2019-09-22 MED ORDER — OXYBUTYNIN CHLORIDE ER 5 MG PO TB24: 5.0000 mg | ORAL_TABLET | Freq: Every day | ORAL | 11 refills | Status: DC

## 2019-09-22 MED ORDER — METFORMIN HCL ER 500 MG PO TB24: ORAL_TABLET | ORAL | 1 refills | Status: DC

## 2019-09-22 NOTE — Patient Instructions (Signed)
Urinary Incontinence  Urinary incontinence refers to a condition in which a person is unable to control where and when to pass urine. A person with this condition will urinate when he or she does not mean to (involuntarily). What are the causes? This condition may be caused by:  Medicines.  Infections.  Constipation.  Overactive bladder muscles.  Weak bladder muscles.  Weak pelvic floor muscles. These muscles provide support for the bladder, intestine, and, in women, the uterus.  Enlarged prostate in men. The prostate is a gland near the bladder. When it gets too big, it can pinch the urethra. With the urethra blocked, the bladder can weaken and lose the ability to empty properly.  Surgery.  Emotional factors, such as anxiety, stress, or post-traumatic stress disorder (PTSD).  Pelvic organ prolapse. This happens in women when organs shift out of place and into the vagina. This shift can prevent the bladder and urethra from working properly. What increases the risk? The following factors may make you more likely to develop this condition:  Older age.  Obesity and physical inactivity.  Pregnancy and childbirth.  Menopause.  Diseases that affect the nerves or spinal cord (neurological diseases).  Long-term (chronic) coughing. This can increase pressure on the bladder and pelvic floor muscles. What are the signs or symptoms? Symptoms may vary depending on the type of urinary incontinence you have. They include:  A sudden urge to urinate, but passing urine involuntarily before you can get to a bathroom (urge incontinence).  Suddenly passing urine with any activity that forces urine to pass, such as coughing, laughing, exercise, or sneezing (stress incontinence).  Needing to urinate often, but urinating only a small amount, or constantly dribbling urine (overflow incontinence).  Urinating because you cannot get to the bathroom in time due to a physical disability, such as  arthritis or injury, or communication and thinking problems, such as Alzheimer disease (functional incontinence). How is this diagnosed? This condition may be diagnosed based on:  Your medical history.  A physical exam.  Tests, such as: ? Urine tests. ? X-rays of your kidney and bladder. ? Ultrasound. ? CT scan. ? Cystoscopy. In this procedure, a health care provider inserts a tube with a light and camera (cystoscope) through the urethra and into the bladder in order to check for problems. ? Urodynamic testing. These tests assess how well the bladder, urethra, and sphincter can store and release urine. There are different types of urodynamic tests, and they vary depending on what the test is measuring. To help diagnose your condition, your health care provider may recommend that you keep a log of when you urinate and how much you urinate. How is this treated? Treatment for this condition depends on the type of incontinence that you have and its cause. Treatment may include:  Lifestyle changes, such as: ? Quitting smoking. ? Maintaining a healthy weight. ? Staying active. Try to get 150 minutes of moderate-intensity exercise every week. Ask your health care provider which activities are safe for you. ? Eating a healthy diet.  Avoid high-fat foods, like fried foods.  Avoid refined carbohydrates like white bread and white rice.  Limit how much alcohol and caffeine you drink.  Increase your fiber intake. Foods such as fresh fruits, vegetables, beans, and whole grains are healthy sources of fiber.  Pelvic floor muscle exercises.  Bladder training, such as lengthening the amount of time between bathroom breaks, or using the bathroom at regular intervals.  Using techniques to suppress bladder urges.   This can include distraction techniques or controlled breathing exercises.  Medicines to relax the bladder muscles and prevent bladder spasms.  Medicines to help slow or prevent the  growth of a man's prostate.  Botox injections. These can help relax the bladder muscles.  Using pulses of electricity to help change bladder reflexes (electrical nerve stimulation).  For women, using a medical device to prevent urine leaks. This is a small, tampon-like, disposable device that is inserted into the urethra.  Injecting collagen or carbon beads (bulking agents) into the urinary sphincter. These can help thicken tissue and close the bladder opening.  Surgery. Follow these instructions at home: Lifestyle  Limit alcohol and caffeine. These can fill your bladder quickly and irritate it.  Keep yourself clean to help prevent odors and skin damage. Ask your doctor about special skin creams and cleansers that can protect the skin from urine.  Consider wearing pads or adult diapers. Make sure to change them regularly, and always change them right after experiencing incontinence. General instructions  Take over-the-counter and prescription medicines only as told by your health care provider.  Use the bathroom about every 3-4 hours, even if you do not feel the need to urinate. Try to empty your bladder completely every time. After urinating, wait a minute. Then try to urinate again.  Make sure you are in a relaxed position while urinating.  If your incontinence is caused by nerve problems, keep a log of the medicines you take and the times you go to the bathroom.  Keep all follow-up visits as told by your health care provider. This is important. Contact a health care provider if:  You have pain that gets worse.  Your incontinence gets worse. Get help right away if:  You have a fever or chills.  You are unable to urinate.  You have redness in your groin area or down your legs. Summary  Urinary incontinence refers to a condition in which a person is unable to control where and when to pass urine.  This condition may be caused by medicines, infection, weak bladder  muscles, weak pelvic floor muscles, enlargement of the prostate (in men), or surgery.  The following factors increase your risk for developing this condition: older age, obesity, pregnancy and childbirth, menopause, neurological diseases, and chronic coughing.  There are several types of urinary incontinence. They include urge incontinence, stress incontinence, overflow incontinence, and functional incontinence.  This condition is usually treated first with lifestyle and behavioral changes, such as quitting smoking, eating a healthier diet, and doing regular pelvic floor exercises. Other treatment options include medicines, bulking agents, medical devices, electrical nerve stimulation, or surgery. This information is not intended to replace advice given to you by your health care provider. Make sure you discuss any questions you have with your health care provider. Document Revised: 07/12/2017 Document Reviewed: 10/11/2016 Elsevier Patient Education  [place]. Diabetes Basics  Diabetes (diabetes mellitus) is a long-term (chronic) disease. It occurs when the body does not properly use sugar (glucose) that is released from food after you eat. Diabetes may be caused by one or both of these problems:  Your pancreas does not make enough of a hormone called insulin.  Your body does not react in a normal way to insulin that it makes. Insulin lets sugars (glucose) go into cells in your body. This gives you energy. If you have diabetes, sugars cannot get into cells. This causes high blood sugar (hyperglycemia). Follow these instructions at home: How is diabetes  treated? You may need to take insulin or other diabetes medicines daily to keep your blood sugar in balance. Take your diabetes medicines every day as told by your doctor. List your diabetes medicines here: Diabetes medicines  Name of medicine: ______________________________ ? Amount (dose): _______________ Time (a.m./p.m.):  _______________ Notes: ___________________________________  Name of medicine: ______________________________ ? Amount (dose): _______________ Time (a.m./p.m.): _______________ Notes: ___________________________________  Name of medicine: ______________________________ ? Amount (dose): _______________ Time (a.m./p.m.): _______________ Notes: ___________________________________ If you use insulin, you will learn how to give yourself insulin by injection. You may need to adjust the amount based on the food that you eat. List the types of insulin you use here: Insulin  Insulin type: ______________________________ ? Amount (dose): _______________ Time (a.m./p.m.): _______________ Notes: ___________________________________  Insulin type: ______________________________ ? Amount (dose): _______________ Time (a.m./p.m.): _______________ Notes: ___________________________________  Insulin type: ______________________________ ? Amount (dose): _______________ Time (a.m./p.m.): _______________ Notes: ___________________________________  Insulin type: ______________________________ ? Amount (dose): _______________ Time (a.m./p.m.): _______________ Notes: ___________________________________  Insulin type: ______________________________ ? Amount (dose): _______________ Time (a.m./p.m.): _______________ Notes: ___________________________________ How do I manage my blood sugar?  Check your blood sugar levels using a blood glucose monitor as directed by your doctor. Your doctor will set treatment goals for you. Generally, you should have these blood sugar levels:  Before meals (preprandial): 80-130 mg/dL (1.6-1.04.4-7.2 mmol/L).  After meals (postprandial): below 180 mg/dL (10 mmol/L).  A1c level: less than 7%. Write down the times that you will check your blood sugar levels: Blood sugar checks  Time: _______________ Notes: ___________________________________  Time: _______________ Notes:  ___________________________________  Time: _______________ Notes: ___________________________________  Time: _______________ Notes: ___________________________________  Time: _______________ Notes: ___________________________________  Time: _______________ Notes: ___________________________________  What do I need to know about low blood sugar? Low blood sugar is called hypoglycemia. This is when blood sugar is at or below 70 mg/dL (3.9 mmol/L). Symptoms may include:  Feeling: ? Hungry. ? Worried or nervous (anxious). ? Sweaty and clammy. ? Confused. ? Dizzy. ? Sleepy. ? Sick to your stomach (nauseous).  Having: ? A fast heartbeat. ? A headache. ? A change in your vision. ? Tingling or no feeling (numbness) around the mouth, lips, or tongue. ? Jerky movements that you cannot control (seizure).  Having trouble with: ? Moving (coordination). ? Sleeping. ? Passing out (fainting). ? Getting upset easily (irritability). Treating low blood sugar To treat low blood sugar, eat or drink something sugary right away. If you can think clearly and swallow safely, follow the 15:15 rule:  Take 15 grams of a fast-acting carb (carbohydrate). Talk with your doctor about how much you should take.  Some fast-acting carbs are: ? Sugar tablets (glucose pills). Take 3-4 glucose pills. ? 6-8 pieces of hard candy. ? 4-6 oz (120-150 mL) of fruit juice. ? 4-6 oz (120-150 mL) of regular (not diet) soda. ? 1 Tbsp (15 mL) honey or sugar.  Check your blood sugar 15 minutes after you take the carb.  If your blood sugar is still at or below 70 mg/dL (3.9 mmol/L), take 15 grams of a carb again.  If your blood sugar does not go above 70 mg/dL (3.9 mmol/L) after 3 tries, get help right away.  After your blood sugar goes back to normal, eat a meal or a snack within 1 hour. Treating very low blood sugar If your blood sugar is at or below 54 mg/dL (3 mmol/L), you have very low blood sugar (severe  hypoglycemia). This is an emergency. Do not wait to see  if the symptoms will go away. Get medical help right away. Call your local emergency services (911 in the [place] Do not drive yourself to the hospital. Questions to ask your health care provider  Do I need to meet with a diabetes educator?  What equipment will I need to care for myself at home?  What diabetes medicines do I need? When should I take them?  How often do I need to check my blood sugar?  What number can I call if I have questions?  When is my next doctor's visit?  Where can I find a support group for people with diabetes? Where to find more information  American Diabetes Association: www.diabetes.org  American Association of Diabetes Educators: www.diabeteseducator.org/patient-resources Contact a doctor if:  Your blood sugar is at or above 240 mg/dL (85.4 mmol/L) for 2 days in a row.  You have been sick or have had a fever for 2 days or more, and you are not getting better.  You have any of these problems for more than 6 hours: ? You cannot eat or drink. ? You feel sick to your stomach (nauseous). ? You throw up (vomit). ? You have watery poop (diarrhea). Get help right away if:  Your blood sugar is lower than 54 mg/dL (3 mmol/L).  You get confused.  You have trouble: ? Thinking clearly. ? Breathing. Summary  Diabetes (diabetes mellitus) is a long-term (chronic) disease. It occurs when the body does not properly use sugar (glucose) that is released from food after digestion.  Take insulin and diabetes medicines as told.  Check your blood sugar every day, as often as told.  Keep all follow-up visits as told by your doctor. This is important. This information is not intended to replace advice given to you by your health care provider. Make sure you discuss any questions you have with your health care provider. Document Revised: 03/25/2019 Document Reviewed: 10/04/2017 Elsevier Patient Education   2020 [employer]. Hypertension, Adult Hypertension is another name for high blood pressure. High blood pressure forces your heart to work harder to pump blood. This can cause problems over time. There are two numbers in a blood pressure reading. There is a top number (systolic) over a bottom number (diastolic). It is best to have a blood pressure that is below 120/80. Healthy choices can help lower your blood pressure, or you may need medicine to help lower it. What are the causes? The cause of this condition is not known. Some conditions may be related to high blood pressure. What increases the risk?  Smoking.  Having type 2 diabetes mellitus, high cholesterol, or both.  Not getting enough exercise or physical activity.  Being overweight.  Having too much fat, sugar, calories, or salt (sodium) in your diet.  Drinking too much alcohol.  Having long-term (chronic) kidney disease.  Having a family history of high blood pressure.  Age. Risk increases with age.  Race. You may be at higher risk if you are African American.  Gender. Men are at higher risk than women before age 60. After age 69, women are at higher risk than men.  Having obstructive sleep apnea.  Stress. What are the signs or symptoms?  High blood pressure may not cause symptoms. Very high blood pressure (hypertensive crisis) may cause: ? Headache. ? Feelings of worry or nervousness (anxiety). ? Shortness of breath. ? Nosebleed. ? A feeling of being sick to your stomach (nausea). ? Throwing up (vomiting). ? Changes in how you see. ?  Very bad chest pain. ? Seizures. How is this treated?  This condition is treated by making healthy lifestyle changes, such as: ? Eating healthy foods. ? Exercising more. ? Drinking less alcohol.  Your health care provider may prescribe medicine if lifestyle changes are not enough to get your blood pressure under control, and if: ? Your top number is above 130. ? Your bottom  number is above 80.  Your personal target blood pressure may vary. Follow these instructions at home: Eating and drinking   If told, follow the DASH eating plan. To follow this plan: ? Fill one half of your plate at each meal with fruits and vegetables. ? Fill one fourth of your plate at each meal with whole grains. Whole grains include whole-wheat pasta, brown rice, and whole-grain bread. ? Eat or drink low-fat dairy products, such as skim milk or low-fat yogurt. ? Fill one fourth of your plate at each meal with low-fat (lean) proteins. Low-fat proteins include fish, chicken without skin, eggs, beans, and tofu. ? Avoid fatty meat, cured and processed meat, or chicken with skin. ? Avoid pre-made or processed food.  Eat less than 1,500 mg of salt each day.  Do not drink alcohol if: ? Your doctor tells you not to drink. ? You are pregnant, may be pregnant, or are planning to become pregnant.  If you drink alcohol: ? Limit how much you use to:  0-1 drink a day for women.  0-2 drinks a day for men. ? Be aware of how much alcohol is in your drink. In the [place] one drink equals one 12 oz bottle of beer (355 mL), one 5 oz glass of wine (148 mL), or one 1 oz glass of hard liquor (44 mL). Lifestyle   Work with your doctor to stay at a healthy weight or to lose weight. Ask your doctor what the best weight is for you.  Get at least 30 minutes of exercise most days of the week. This may include walking, swimming, or biking.  Get at least 30 minutes of exercise that strengthens your muscles (resistance exercise) at least 3 days a week. This may include lifting weights or doing Pilates.  Do not use any products that contain nicotine or tobacco, such as cigarettes, e-cigarettes, and chewing tobacco. If you need help quitting, ask your doctor.  Check your blood pressure at home as told by your doctor.  Keep all follow-up visits as told by your doctor. This is important. Medicines  Take  over-the-counter and prescription medicines only as told by your doctor. Follow directions carefully.  Do not skip doses of blood pressure medicine. The medicine does not work as well if you skip doses. Skipping doses also puts you at risk for problems.  Ask your doctor about side effects or reactions to medicines that you should watch for. Contact a doctor if you:  Think you are having a reaction to the medicine you are taking.  Have headaches that keep coming back (recurring).  Feel dizzy.  Have swelling in your ankles.  Have trouble with your vision. Get help right away if you:  Get a very bad headache.  Start to feel mixed up (confused).  Feel weak or numb.  Feel faint.  Have very bad pain in your: ? Chest. ? Belly (abdomen).  Throw up more than once.  Have trouble breathing. Summary  Hypertension is another name for high blood pressure.  High blood pressure forces your heart to work harder to pump  blood.  For most people, a normal blood pressure is less than 120/80.  Making healthy choices can help lower blood pressure. If your blood pressure does not get lower with healthy choices, you may need to take medicine. This information is not intended to replace advice given to you by your health care provider. Make sure you discuss any questions you have with your health care provider. Document Revised: 03/12/2018 Document Reviewed: 03/12/2018 Elsevier Patient Education  2020 [employer].

## 2019-09-22 NOTE — Progress Notes (Signed)
Established Patient Office Visit  Subjective:  Patient ID: Zachary Lyons, male    DOB: 1946-04-27  Age: 74 y.o. MRN: [id number]  CC:  Chief Complaint  Patient presents with  . Diabetes  . Hypertension  . Hyperlipidemia  . Gastroesophageal Reflux    HPI Zachary Lyons is a pleasant 74 year old male presenting for follow-up for chronic disease management diabetes, hypertension, and hyperlipidemia.  He is requesting refills on his chronic medications today.   HYPERTENSION Patient reports he feels his blood pressure is well controlled at home.  He denies any side effects from the medication or any symptoms associated with hypertension. Hypertension status: controlled  Satisfied with current treatment? yes Duration of hypertension: chronic BP monitoring frequency:  not checking BP medication side effects:  no Medication compliance: good compliance Aspirin: yes Recurrent headaches: no Visual changes: macular degeneration Palpitations: no Dyspnea: no Chest pain: no Lower extremity edema: no Dizzy/lightheaded: no  HYPERLIPIDEMIA Hyperlipidemia status: good compliance Satisfied with current treatment?  yes Side effects:  no Medication compliance: good compliance Aspirin:  yes The 10-year ASCVD risk score Denman George DC Jr., et al., 2013) is: 42.3%   Values used to calculate the score:     Age: 41 years     Sex: Male     Is Non-Hispanic African American: No     Diabetic: Yes     Tobacco smoker: No     Systolic Blood Pressure: 147 mmHg     Is BP treated: Yes     HDL Cholesterol: 80 mg/dL     Total Cholesterol: 166 mg/dL Chest pain:  no Coronary artery disease:  no Family history CAD:  yes Family history Shannette Tabares CAD:  no   DIABETES Patient reports he feels diabetes is well controlled today.  He does not check his blood sugars at home but denies any symptoms. Taking medications as prescribed: yes Taking Insulin?: no Glucose Monitoring: no Hypoglycemic  episodes:no Polydipsia/polyuria: no Visual changes: macular degeneration Chest pain: no Paresthesias: no Blood Pressure Monitoring: not checking Retinal Examination: Up to Date Foot Exam: Up to Date Diabetic Education: Completed Pneumovax: Up to Date Influenza: Up to Date Aspirin: yes   GERD Patient reports GERD symptoms well controlled with medications.   GERD control status: controlled Satisfied with current treatment? yes Medication side effects: no  Medication compliance: stable Heartburn frequency: none with medication Dysphagia: no Odynophagia:  no Hematemesis: no Blood in stool: no EGD: no  LUTS Patient reports that he is having lower urinary tract symptoms including urge incontinence.  He has been trying tamsulosin however this is not working for him.  He reports incontinence and inability to make it to the restroom in time on a daily basis.  He denies any other urinary tract symptoms associated with this.  He reports he was told in the past that was related to his enlarged prostate.  Would like to try a different medication if possible.  Patient reports he had knee replacement surgery in 2019 and was told he would have to have antibiotics for any dental cleanings.  He has an appointment with a dentist in April where the dentist wrote a prescription for an antibiotic.  They have misplaced the antibiotic and requesting refills of this today.  Past Medical History:  Diagnosis Date  . Arthritis    osteoarthritis  . GERD (gastroesophageal reflux disease)   . Hypertension   . Nasal polyps    removed  . Peptic ulcer    hx. of  .  Pre-diabetes   . Transfusion history 04-22-13   15 yrs ago-"bleeding ulcer"    Past Surgical History:  Procedure Laterality Date  . CATARACT EXTRACTION Left 04-22-13  . COLONOSCOPY    . JOINT REPLACEMENT     Right total knee arthroplasty  . NASAL SINUS SURGERY     polyps revoved 08-2017  . STOMACH SURGERY     oversew of bleeding ulcer  .  TOTAL KNEE ARTHROPLASTY Left 04/27/2013   Procedure: LEFT TOTAL KNEE ARTHROPLASTY;  Surgeon: Loanne DrillingFrank V Aluisio, MD;  Location: [facility];  Service: Orthopedics;  Laterality: Left;  . TOTAL KNEE ARTHROPLASTY Right 03/24/2018   Procedure: RIGHT TOTAL KNEE ARTHROPLASTY;  Surgeon: Ollen GrossAluisio, Frank, MD;  Location: [facility];  Service: Orthopedics;  Laterality: Right;    History reviewed. No pertinent family history.  Social History   Socioeconomic History  . Marital status: Married    Spouse name: Not on file  . Number of children: Not on file  . Years of education: Not on file  . Highest education level: Not on file  Occupational History  . Not on file  Tobacco Use  . Smoking status: Former Smoker    Quit date: 04/23/1979    Years since quitting: 40.4  . Smokeless tobacco: Never Used  Substance and Sexual Activity  . Alcohol use: Yes    Alcohol/week: 6.0 standard drinks    Types: 6 Cans of beer per week    Comment: occasional  . Drug use: No  . Sexual activity: Yes    Partners: Female  Other Topics Concern  . Not on file  Social History Narrative  . Not on file   Social Determinants of Health   Financial Resource Strain:   . Difficulty of Paying Living Expenses: Not on file  Food Insecurity:   . Worried About Programme researcher, broadcasting/film/videounning Out of Food in the Last Year: Not on file  . Ran Out of Food in the Last Year: Not on file  Transportation Needs:   . Lack of Transportation (Medical): Not on file  . Lack of Transportation (Non-Medical): Not on file  Physical Activity:   . Days of Exercise per Week: Not on file  . Minutes of Exercise per Session: Not on file  Stress:   . Feeling of Stress : Not on file  Social Connections:   . Frequency of Communication with Friends and Family: Not on file  . Frequency of Social Gatherings with Friends and Family: Not on file  . Attends Religious Services: Not on file  . Active Member of Clubs or Organizations: Not on file  . Attends BankerClub or Organization Meetings:  Not on file  . Marital Status: Not on file  Intimate Partner Violence:   . Fear of Current or Ex-Partner: Not on file  . Emotionally Abused: Not on file  . Physically Abused: Not on file  . Sexually Abused: Not on file    Outpatient Medications Prior to Visit  Medication Sig Dispense Refill  . diclofenac sodium (VOLTAREN) 1 % GEL APPLY 4 GRAMS TOPICALLY 4 TIMES DAILY TO AFFECTED JOINT. 200 g 0  . clotrimazole-betamethasone (LOTRISONE) cream Apply 1 application topically 2 (two) times daily. 80 g 1  . famotidine (PEPCID) 40 MG tablet TAKE 1 TABLET EVERY DAY 90 tablet 1  . metFORMIN (GLUCOPHAGE-XR) 500 MG 24 hr tablet TAKE 1 TABLET EVERY DAY WITH BREAKFAST 90 tablet 1  . metoprolol succinate (TOPROL-XL) 50 MG 24 hr tablet Take 3 tablets (150 mg total) by mouth  daily. TAKE 3 TABLETS DAILY WITH OR IMMEDIATELY AFTER FOOD. STOP 200 MG DOSE FOR NOW (MUST MAKE APPOINTMENT) 270 tablet 0  . omeprazole (PRILOSEC) 40 MG capsule TAKE 1 CAPSULE EVERY DAY 90 capsule 1  . simvastatin (ZOCOR) 10 MG tablet TAKE 1 TABLET AT BEDTIME 90 tablet 1  . valsartan-hydrochlorothiazide (DIOVAN-HCT) 320-25 MG tablet TAKE 1 TABLET EVERY DAY 30 tablet 0  . Azelastine-Fluticasone 137-50 MCG/ACT SUSP One spray each nostril BID (Patient not taking: Reported on 09/22/2019) 1 Bottle 11  . Hypertonic Nasal Wash (SINUS RINSE NA) Place 1 Dose into the nose daily.    . tamsulosin (FLOMAX) 0.4 MG CAPS capsule Take 1 capsule (0.4 mg total) by mouth daily. (Patient not taking: Reported on 09/22/2019) 90 capsule 1   No facility-administered medications prior to visit.    No Known Allergies  ROS Review of Systems  Constitutional: Negative for activity change, chills, fatigue and fever.  Eyes: Positive for visual disturbance.       Macular degeneration   Respiratory: Negative for cough, choking, chest tightness and shortness of breath.   Cardiovascular: Negative for chest pain, palpitations and leg swelling.  Gastrointestinal:  Negative for abdominal distention, abdominal pain, constipation, diarrhea and nausea.  Endocrine: Negative for polydipsia, polyphagia and polyuria.  Genitourinary: Positive for frequency and urgency. Negative for difficulty urinating, dysuria, enuresis, flank pain, genital sores, hematuria and testicular pain.  Musculoskeletal: Positive for arthralgias and neck pain.  Neurological: Negative for dizziness, syncope, weakness, light-headedness, numbness and headaches.  Psychiatric/Behavioral: Negative for sleep disturbance. The patient is not nervous/anxious.       Objective:    Physical Exam  Constitutional: He is oriented to person, place, and time. He appears well-developed and well-nourished.  HENT:  Head: Normocephalic and atraumatic.  Eyes: Pupils are equal, round, and reactive to light. Conjunctivae and EOM are normal.  Neck: No JVD present. No thyromegaly present.  Cardiovascular: Normal rate, regular rhythm, normal heart sounds and intact distal pulses. PMI is not displaced.  Pulmonary/Chest: Effort normal and breath sounds normal. No respiratory distress. He exhibits no tenderness.  Abdominal: Soft. Bowel sounds are normal. He exhibits no distension. There is no abdominal tenderness. There is no guarding.  Musculoskeletal:        General: No edema. Normal range of motion.     Cervical back: Normal range of motion and neck supple.  Neurological: He is alert and oriented to person, place, and time. Coordination normal.  Skin: Skin is warm and dry.  Psychiatric: He has a normal mood and affect. His behavior is normal. Judgment and thought content normal.  Nursing note and vitals reviewed.   BP (!) 147/80   Pulse 62   Temp 98.2 F (36.8 C) (Oral)   Ht 5\' 3"  (1.6 m)   Wt 161 lb (73 kg)   SpO2 99%   BMI 28.52 kg/m  Wt Readings from Last 3 Encounters:  09/22/19 161 lb (73 kg)  03/06/19 161 lb 1.6 oz (73.1 kg)  12/02/18 165 lb 8 oz (75.1 kg)     Health Maintenance Due   Topic Date Due  . TETANUS/TDAP  12/14/1964  . COLONOSCOPY  12/15/1995  . PNA vac Low Risk Adult (1 of 2 - PCV13) 12/15/2010    There are no preventive care reminders to display for this patient.  Lab Results  Component Value Date   TSH 1.14 01/31/2018   Lab Results  Component Value Date   WBC 5.7 03/06/2019   HGB 14.1 03/06/2019  HCT 44.0 03/06/2019   MCV 89.2 03/06/2019   PLT 185 03/06/2019   Lab Results  Component Value Date   NA 137 03/06/2019   K 3.9 03/06/2019   CO2 27 03/06/2019   GLUCOSE 112 (H) 03/06/2019   BUN 9 03/06/2019   CREATININE 1.07 03/06/2019   BILITOT 0.8 03/06/2019   ALKPHOS 108 03/19/2018   AST 39 (H) 03/06/2019   ALT 26 03/06/2019   PROT 7.5 03/06/2019   ALBUMIN 4.1 03/19/2018   CALCIUM 9.5 03/06/2019   ANIONGAP 9 03/26/2018   Lab Results  Component Value Date   CHOL 166 03/06/2019   Lab Results  Component Value Date   HDL 80 03/06/2019   Lab Results  Component Value Date   LDLCALC 71 03/06/2019   Lab Results  Component Value Date   TRIG 69 03/06/2019   Lab Results  Component Value Date   CHOLHDL 2.1 03/06/2019   Lab Results  Component Value Date   HGBA1C 6.3 (A) 03/06/2019      Assessment & Plan:  1. Controlled type 2 diabetes mellitus with complication, without long-term current use of insulin (HCC) Plan to continue current medication regimen. We will obtain hemoglobin A1c and CMP today to determine status of diabetes and notify patient if any changes need to be made. Refills provided today. Follow-up in 6 months or sooner if needed. - COMPLETE METABOLIC PANEL WITH GFR - Hemoglobin A1c - metFORMIN (GLUCOPHAGE-XR) 500 MG 24 hr tablet; TAKE 1 TABLET EVERY DAY WITH BREAKFAST  Dispense: 90 tablet; Refill: 1  2. Essential hypertension, benign Blood pressure initially elevated on evaluation today however improved towards the end of the visit with a recheck.  We will continue medications as prescribed.  Refills provided  today. Will obtain a metabolic panel to evaluate kidney function. Follow-up in 6 months or sooner if needed. - COMPLETE METABOLIC PANEL WITH GFR - metoprolol succinate (TOPROL-XL) 50 MG 24 hr tablet; Take 3 tablets (150 mg total) by mouth daily. Take with or immediately after food.  Dispense: 270 tablet; Refill: 1 - valsartan-hydrochlorothiazide (DIOVAN-HCT) 320-25 MG tablet; Take 1 tablet by mouth daily.  Dispense: 90 tablet; Refill: 1  3. Hyperlipidemia, unspecified hyperlipidemia type Most recent labs revealed lipids within normal limits.  Patient denies any side effects from medications. Refills provided today. Will obtain CMP today to evaluate liver function. Follow-up in 6 months or sooner if needed. - COMPLETE METABOLIC PANEL WITH GFR - simvastatin (ZOCOR) 10 MG tablet; Take 1 tablet (10 mg total) by mouth at bedtime.  Dispense: 90 tablet; Refill: 1  4. Elevated LFTs History of elevated LFTs.  Patient denies any concerns today. Will obtain CMP and evaluate liver function and notify patient of any changes to the plan of care. - COMPLETE METABOLIC PANEL WITH GFR  5. Gastroesophageal reflux disease Reflux well-controlled on current medication regimen.  Patient denies any side effects or concerns today. Refills provided. Follow-up in 6 months or sooner as needed. - famotidine (PEPCID) 40 MG tablet; Take 1 tablet (40 mg total) by mouth daily.  Dispense: 90 tablet; Refill: 1 - omeprazole (PRILOSEC) 40 MG capsule; Take 1 capsule (40 mg total) by mouth daily.  Dispense: 90 capsule; Refill: 1  7. DDD (degenerative disc disease), cervical Patient reports significant improvement with use of diclofenac. Refill provided today. - diclofenac Sodium (VOLTAREN) 1 % GEL; Apply 2 g topically 4 (four) times daily. To affected joint.  Dispense: 100 g; Refill: 11  8. Lower urinary tract symptoms (LUTS)  Lower urinary tract symptoms remain uncontrolled with tamsulosin.  Discussed medication options  and will trial oxybutynin. Discussed monitoring for symptoms of decreased blood pressure and to notify the office immediately if this occurs. Will consider increased dose to 10 mg if symptoms are not well controlled at this dose and if blood pressure remains stable. Follow-up in 6 months or sooner if needed. - oxybutynin (DITROPAN-XL) 5 MG 24 hr tablet; Take 1 tablet (5 mg total) by mouth at bedtime.  Dispense: 30 tablet; Refill: 11  9. Eczema, unspecified type Symptoms well controlled with Lotrisone cream. Refill provided today - clotrimazole-betamethasone (LOTRISONE) cream; Apply 1 application topically 2 (two) times daily.  Dispense: 80 g; Refill: 3  Discussed with the patient and his wife about the current guidelines against antibiotics for routine dental cleanings after joint placement surgery.  I suggested they contact the dentist directly to obtain a new prescription if this was a requirement by the specific practice.    55 minutes was spent on this visit in counseling, discussion, and evaluation.   Follow-up: Return in about 6 months (around 03/24/2020) for HLD/HTN/DM.    Tollie Eth, NP

## 2019-09-23 LAB — COMPLETE METABOLIC PANEL WITH GFR
AG Ratio: 1.5 (calc) (ref 1.0–2.5)
ALT: 22 U/L (ref 9–46)
AST: 32 U/L (ref 10–35)
Albumin: 4.4 g/dL (ref 3.6–5.1)
Alkaline phosphatase (APISO): 97 U/L (ref 35–144)
BUN: 9 mg/dL (ref 7–25)
CO2: 26 mmol/L (ref 20–32)
Calcium: 9.2 mg/dL (ref 8.6–10.3)
Chloride: 96 mmol/L — ABNORMAL LOW (ref 98–110)
Creat: 0.95 mg/dL (ref 0.70–1.18)
GFR, Est African American: 92 mL/min/{1.73_m2} (ref >=60)
GFR, Est Non African American: 79 mL/min/{1.73_m2} (ref >=60)
Globulin: 3 g/dL (calc) (ref 1.9–3.7)
Glucose, Bld: 90 mg/dL (ref 65–99)
Potassium: 3.6 mmol/L (ref 3.5–5.3)
Sodium: 133 mmol/L — ABNORMAL LOW (ref 135–146)
Total Bilirubin: 0.6 mg/dL (ref 0.2–1.2)
Total Protein: 7.4 g/dL (ref 6.1–8.1)

## 2019-09-23 LAB — HEMOGLOBIN A1C
Hgb A1c MFr Bld: 6.4 % of total Hgb — ABNORMAL HIGH (ref <5.7)
Mean Plasma Glucose: 137 (calc)
eAG (mmol/L): 7.6 (calc)

## 2019-12-07 ENCOUNTER — Other Ambulatory Visit: Payer: Self-pay | Admitting: Osteopathic Medicine

## 2019-12-08 NOTE — Telephone Encounter (Signed)
[facility] pharmacy requesting med refill for tamsulosin. Rx not listed in active med list.

## 2019-12-22 ENCOUNTER — Other Ambulatory Visit: Payer: Self-pay | Admitting: Nurse Practitioner

## 2019-12-22 DIAGNOSIS — I10 Essential (primary) hypertension: Secondary | ICD-10-CM

## 2019-12-22 DIAGNOSIS — K219 Gastro-esophageal reflux disease without esophagitis: Secondary | ICD-10-CM

## 2019-12-22 DIAGNOSIS — E785 Hyperlipidemia, unspecified: Secondary | ICD-10-CM

## 2019-12-22 DIAGNOSIS — E118 Type 2 diabetes mellitus with unspecified complications: Secondary | ICD-10-CM

## 2019-12-22 DIAGNOSIS — R399 Unspecified symptoms and signs involving the genitourinary system: Secondary | ICD-10-CM

## 2019-12-22 MED ORDER — OMEPRAZOLE 40 MG PO CPDR: 40.0000 mg | DELAYED_RELEASE_CAPSULE | Freq: Every day | ORAL | 2 refills | Status: DC

## 2019-12-22 MED ORDER — METOPROLOL SUCCINATE ER 50 MG PO TB24: 150.0000 mg | ORAL_TABLET | Freq: Every day | ORAL | 2 refills | Status: DC

## 2019-12-22 MED ORDER — SIMVASTATIN 10 MG PO TABS: 10.0000 mg | ORAL_TABLET | Freq: Every day | ORAL | 2 refills | Status: DC

## 2019-12-22 MED ORDER — OXYBUTYNIN CHLORIDE ER 5 MG PO TB24: 5.0000 mg | ORAL_TABLET | Freq: Every day | ORAL | 3 refills | Status: DC

## 2019-12-22 MED ORDER — VALSARTAN-HYDROCHLOROTHIAZIDE 320-25 MG PO TABS: 1.0000 | ORAL_TABLET | Freq: Every day | ORAL | 2 refills | Status: DC

## 2019-12-22 MED ORDER — METFORMIN HCL ER 500 MG PO TB24: ORAL_TABLET | ORAL | 2 refills | Status: DC

## 2019-12-22 MED ORDER — FAMOTIDINE 40 MG PO TABS: 40.0000 mg | ORAL_TABLET | Freq: Every day | ORAL | 2 refills | Status: DC

## 2019-12-25 ENCOUNTER — Telehealth: Payer: Self-pay | Admitting: Osteopathic Medicine

## 2019-12-25 NOTE — Telephone Encounter (Signed)
Received PA for coverage on Metoprolol Succinate sent through cover my meds waiting on determination. - CF

## 2019-12-25 NOTE — Telephone Encounter (Signed)
Received fax from [facility] and they approved coverage on Metoprolol Succ valid until 07/15/2020. - CF

## 2020-03-22 ENCOUNTER — Encounter: Payer: Self-pay | Admitting: Osteopathic Medicine

## 2020-06-02 ENCOUNTER — Ambulatory Visit: Payer: Medicare HMO | Admitting: Nurse Practitioner

## 2020-06-06 ENCOUNTER — Encounter: Payer: Self-pay | Admitting: Nurse Practitioner

## 2020-06-06 ENCOUNTER — Other Ambulatory Visit: Payer: Self-pay

## 2020-06-06 ENCOUNTER — Ambulatory Visit (INDEPENDENT_AMBULATORY_CARE_PROVIDER_SITE_OTHER): Payer: Medicare HMO | Admitting: Nurse Practitioner

## 2020-06-06 VITALS — BP 180/88 | HR 59 | Temp 97.8°F | Ht 63.0 in | Wt 151.2 lb

## 2020-06-06 DIAGNOSIS — R413 Other amnesia: Secondary | ICD-10-CM | POA: Diagnosis not present

## 2020-06-06 DIAGNOSIS — E782 Mixed hyperlipidemia: Secondary | ICD-10-CM | POA: Diagnosis not present

## 2020-06-06 DIAGNOSIS — M5030 Other cervical disc degeneration, unspecified cervical region: Secondary | ICD-10-CM

## 2020-06-06 DIAGNOSIS — I10 Essential (primary) hypertension: Secondary | ICD-10-CM | POA: Diagnosis not present

## 2020-06-06 DIAGNOSIS — L259 Unspecified contact dermatitis, unspecified cause: Secondary | ICD-10-CM | POA: Diagnosis not present

## 2020-06-06 DIAGNOSIS — E118 Type 2 diabetes mellitus with unspecified complications: Secondary | ICD-10-CM | POA: Diagnosis not present

## 2020-06-06 DIAGNOSIS — L309 Dermatitis, unspecified: Secondary | ICD-10-CM | POA: Insufficient documentation

## 2020-06-06 DIAGNOSIS — R399 Unspecified symptoms and signs involving the genitourinary system: Secondary | ICD-10-CM

## 2020-06-06 DIAGNOSIS — E785 Hyperlipidemia, unspecified: Secondary | ICD-10-CM

## 2020-06-06 LAB — POCT GLYCOSYLATED HEMOGLOBIN (HGB A1C): Hemoglobin A1C: 5.6 % (ref 4.0–5.6)

## 2020-06-06 MED ORDER — MELOXICAM 7.5 MG PO TABS: 7.5000 mg | ORAL_TABLET | Freq: Every day | ORAL | 1 refills | Status: DC | PRN

## 2020-06-06 MED ORDER — METFORMIN HCL ER 500 MG PO TB24: ORAL_TABLET | ORAL | 2 refills | Status: DC

## 2020-06-06 MED ORDER — METOPROLOL SUCCINATE ER 50 MG PO TB24: 150.0000 mg | ORAL_TABLET | Freq: Every day | ORAL | 2 refills | Status: DC

## 2020-06-06 MED ORDER — AMBULATORY NON FORMULARY MEDICATION: 0 refills | Status: DC

## 2020-06-06 MED ORDER — PREDNISONE 20 MG PO TABS: 20.0000 mg | ORAL_TABLET | Freq: Every day | ORAL | 0 refills | Status: DC

## 2020-06-06 MED ORDER — SIMVASTATIN 10 MG PO TABS: 10.0000 mg | ORAL_TABLET | Freq: Every day | ORAL | 2 refills | Status: DC

## 2020-06-06 MED ORDER — DICLOFENAC SODIUM 1 % EX GEL: 2.0000 g | Freq: Four times a day (QID) | CUTANEOUS | 11 refills | Status: DC

## 2020-06-06 MED ORDER — OXYBUTYNIN CHLORIDE ER 5 MG PO TB24: 5.0000 mg | ORAL_TABLET | Freq: Every day | ORAL | 3 refills | Status: DC

## 2020-06-06 MED ORDER — VALSARTAN-HYDROCHLOROTHIAZIDE 320-25 MG PO TABS: 1.0000 | ORAL_TABLET | Freq: Every day | ORAL | 2 refills | Status: DC

## 2020-06-06 MED ORDER — CLOTRIMAZOLE-BETAMETHASONE 1-0.05 % EX CREA: 1.0000 "application " | TOPICAL_CREAM | Freq: Two times a day (BID) | CUTANEOUS | 3 refills | Status: DC

## 2020-06-06 NOTE — Assessment & Plan Note (Signed)
Refills provided today for meloxicam.

## 2020-06-06 NOTE — Assessment & Plan Note (Signed)
Reported memory changes over time. Discussed with wife that a formal evaluation by neurology could be provided to determine if treatment options may be available. Referral for neurology today.

## 2020-06-06 NOTE — Assessment & Plan Note (Deleted)
Symptoms are consistent with contact dermatitis. Recommend throwing out any of the objects that have been washed with bleach and avoid utilizing bleach on clothing or fabrics that may come in contact with skin in the future. If symptoms do not improve with the removal of the suspected irritant please let me know and further evaluation will be required.

## 2020-06-06 NOTE — Progress Notes (Signed)
Established Patient Office Visit  Subjective:  Patient ID: Zachary Lyons, male    DOB: [birth year]  Age: 74 y.o. MRN: [id number]  CC:  Chief Complaint  Patient presents with  . Diabetes  . Hypertension  . Hyperlipidemia    HPI Zachary Lyons is a pleasant 74 year old male presenting today for management of his chronic diabetes, hypertension, and hyperlipidemia.  Hgb A1c, lipid panel, and CMP obtained today.  Patient also has an itchy rash on his upper back and neck.  Patient is also having some memory problems.   DIABETES He is currently taking Metformin  daily at breakfast. He denies any new symptoms or complaints. A1c today 5.6%- great control.  Taking medications as prescribed: yes Glucose Monitoring: no Hypoglycemic episodes:no Polydipsia/polyuria: no Visual changes: no Chest pain: yes Paresthesias: no Blood Pressure Monitoring: not checking Retinal Examination: Not up to Date Foot Exam: Up to Date Diabetic Education: Not Completed   HYPERTENSION He is currently taking metoprolol  daily and valsartan-HCTZ 320-25mg  daily. Hypertension status: exacerbated - BP not well controlled today. Satisfied with current treatment? yes Duration of hypertension: chronic BP monitoring frequency:  not checking BP range:  BP medication side effects:  no Medication compliance: good compliance Aspirin: yes Recurrent headaches: no Visual changes: yes- due for vision check Palpitations: no Dyspnea: no Chest pain: no Lower extremity edema: no Dizzy/lightheaded: no  HYPERLIPIDEMIA He is currently taking Simvastatin  daily at bedtime.  Hyperlipidemia status: will monitor today Satisfied with current treatment?  yes Side effects:  no Medication compliance: good compliance Supplements: none and none Aspirin:  yes The 10-year ASCVD risk score Denman George DC Jr., et al., 2013) is: 57.9%   Values used to calculate the score:     Age: 55 years     Sex: Male      Is Non-Hispanic African American: No     Diabetic: Yes     Tobacco smoker: No     Systolic Blood Pressure: 180 mmHg     Is BP treated: Yes     HDL Cholesterol: 80 mg/dL     Total Cholesterol: 166 mg/dL Chest pain:  no Coronary artery disease:  unknown Family history CAD:  unknown Family history Dwain Huhn CAD:  unknown  He reports that he has an itchy rash on his upper back and neck that started about 4-5 days ago. He has been using lotrisone cream with some relief. He does report that his pillows were recently washed with bleach. He will get new pillows and see if this improves.   His wife reports that he has had some memory issues lately. She reports these are slow changes, but she has noticed that he is having some changes and she would like an evaluation for these changes, if possible.   Past Medical History:  Diagnosis Date  . Arthritis    osteoarthritis  . GERD (gastroesophageal reflux disease)   . Hypertension   . Nasal polyps    removed  . Peptic ulcer    hx. of  . Pre-diabetes   . Transfusion history 04-22-13   15 yrs ago-"bleeding ulcer"    Past Surgical History:  Procedure Laterality Date  . CATARACT EXTRACTION Left 04-22-13  . COLONOSCOPY    . JOINT REPLACEMENT     Right total knee arthroplasty  . NASAL SINUS SURGERY     polyps revoved 08-2017  . STOMACH SURGERY     oversew of bleeding ulcer  . TOTAL KNEE ARTHROPLASTY Left 04/27/2013   Procedure:  LEFT TOTAL KNEE ARTHROPLASTY;  Surgeon: Loanne Drilling, MD;  Location: [facility];  Service: Orthopedics;  Laterality: Left;  . TOTAL KNEE ARTHROPLASTY Right 03/24/2018   Procedure: RIGHT TOTAL KNEE ARTHROPLASTY;  Surgeon: Ollen Gross, MD;  Location: [facility];  Service: Orthopedics;  Laterality: Right;    History reviewed. No pertinent family history.  Social History   Socioeconomic History  . Marital status: Married    Spouse name: Not on file  . Number of children: Not on file  . Years of education: Not on file  .  Highest education level: Not on file  Occupational History  . Not on file  Tobacco Use  . Smoking status: Former Smoker    Quit date: 04/23/1979    Years since quitting: 41.1  . Smokeless tobacco: Never Used  Vaping Use  . Vaping Use: Never used  Substance and Sexual Activity  . Alcohol use: Yes    Alcohol/week: 6.0 standard drinks    Types: 6 Cans of beer per week    Comment: occasional  . Drug use: No  . Sexual activity: Yes    Partners: Female  Other Topics Concern  . Not on file  Social History Narrative  . Not on file   Social Determinants of Health   Financial Resource Strain:   . Difficulty of Paying Living Expenses: Not on file  Food Insecurity:   . Worried About Programme researcher, broadcasting/film/video in the Last Year: Not on file  . Ran Out of Food in the Last Year: Not on file  Transportation Needs:   . Lack of Transportation (Medical): Not on file  . Lack of Transportation (Non-Medical): Not on file  Physical Activity:   . Days of Exercise per Week: Not on file  . Minutes of Exercise per Session: Not on file  Stress:   . Feeling of Stress : Not on file  Social Connections:   . Frequency of Communication with Friends and Family: Not on file  . Frequency of Social Gatherings with Friends and Family: Not on file  . Attends Religious Services: Not on file  . Active Member of Clubs or Organizations: Not on file  . Attends Banker Meetings: Not on file  . Marital Status: Not on file  Intimate Partner Violence:   . Fear of Current or Ex-Partner: Not on file  . Emotionally Abused: Not on file  . Physically Abused: Not on file  . Sexually Abused: Not on file    Outpatient Medications Prior to Visit  Medication Sig Dispense Refill  . diclofenac sodium (VOLTAREN) 1 % GEL APPLY 4 GRAMS TOPICALLY 4 TIMES DAILY TO AFFECTED JOINT. 200 g 0  . famotidine (PEPCID) 40 MG tablet Take 1 tablet (40 mg total) by mouth daily. 90 tablet 2  . omeprazole (PRILOSEC) 40 MG capsule Take  1 capsule (40 mg total) by mouth daily. 90 capsule 2  . clotrimazole-betamethasone (LOTRISONE) cream Apply 1 application topically 2 (two) times daily. 80 g 3  . diclofenac Sodium (VOLTAREN) 1 % GEL Apply 2 g topically 4 (four) times daily. To affected joint. 100 g 11  . metFORMIN (GLUCOPHAGE-XR) 500 MG 24 hr tablet TAKE 1 TABLET EVERY DAY WITH BREAKFAST 90 tablet 2  . metoprolol succinate (TOPROL-XL) 50 MG 24 hr tablet Take 3 tablets (150 mg total) by mouth daily. Take with or immediately after food. 270 tablet 2  . oxybutynin (DITROPAN-XL) 5 MG 24 hr tablet Take 1 tablet (5 mg total) by  mouth at bedtime. 90 tablet 3  . simvastatin (ZOCOR) 10 MG tablet Take 1 tablet (10 mg total) by mouth at bedtime. 90 tablet 2  . tamsulosin (FLOMAX) 0.4 MG CAPS capsule TAKE 1 CAPSULE EVERY DAY 90 capsule 3  . valsartan-hydrochlorothiazide (DIOVAN-HCT) 320-25 MG tablet Take 1 tablet by mouth daily. 90 tablet 2   No facility-administered medications prior to visit.    No Known Allergies  ROS Review of Systems    Objective:    Physical Exam Vitals and nursing note reviewed.  Constitutional:      Appearance: Normal appearance.  HENT:     Head: Normocephalic.     Mouth/Throat:     Mouth: Mucous membranes are moist.     Pharynx: Oropharynx is clear.  Eyes:     Extraocular Movements: Extraocular movements intact.     Conjunctiva/sclera: Conjunctivae normal.     Pupils: Pupils are equal, round, and reactive to light.  Neck:     Vascular: No carotid bruit.  Cardiovascular:     Rate and Rhythm: Normal rate and regular rhythm.     Pulses: Normal pulses.     Heart sounds: Normal heart sounds.  Pulmonary:     Effort: Pulmonary effort is normal.     Breath sounds: Normal breath sounds.  Abdominal:     General: Abdomen is flat. Bowel sounds are normal.     Palpations: Abdomen is soft.  Musculoskeletal:        General: Normal range of motion.     Cervical back: Normal range of motion.     Right  lower leg: No edema.     Left lower leg: No edema.  Skin:    General: Skin is warm and dry.     Capillary Refill: Capillary refill takes less than 2 seconds.     Findings: Rash present.  Neurological:     General: No focal deficit present.     Mental Status: He is alert and oriented to person, place, and time.  Psychiatric:        Mood and Affect: Mood normal.        Behavior: Behavior normal.        Thought Content: Thought content normal.        Judgment: Judgment normal.    Diabetic Foot Exam - Simple   Simple Foot Form Diabetic Foot exam was performed with the following findings: Yes 06/06/2020  3:01 PM  Visual Inspection No deformities, no ulcerations, no other skin breakdown bilaterally: Yes Sensation Testing See comments: Yes Pulse Check Posterior Tibialis and Dorsalis pulse intact bilaterally: Yes Comments Monofilament testing was normal on right foot, abnormal on left with 0/10 points felt.    Repeat monofilament on left foot revealed limited sensation on the lateral side of the foot and the plantar side of the great toe.   BP (!) 180/88   Pulse (!) 59   Temp 97.8 F (36.6 C) (Oral)   Ht  (1.6 m)   Wt 151 lb 3.2 oz (68.6 kg)   SpO2 97%   BMI 26.78 kg/m  Wt Readings from Last 3 Encounters:  06/06/20 151 lb 3.2 oz (68.6 kg)  09/22/19 161 lb (73 kg)  03/06/19 161 lb 1.6 oz (73.1 kg)     There are no preventive care reminders to display for this patient.  There are no preventive care reminders to display for this patient.  Lab Results  Component Value Date   TSH 1.14 01/31/2018  Lab Results  Component Value Date   WBC 5.7 03/06/2019   HGB 14.1 03/06/2019   HCT 44.0 03/06/2019   MCV 89.2 03/06/2019   PLT 185 03/06/2019   Lab Results  Component Value Date   NA 133 (L) 09/22/2019   K 3.6 09/22/2019   CO2 26 09/22/2019   GLUCOSE 90 09/22/2019   BUN 9 09/22/2019   CREATININE 0.95 09/22/2019   BILITOT 0.6 09/22/2019   ALKPHOS 108 03/19/2018    AST 32 09/22/2019   ALT 22 09/22/2019   PROT 7.4 09/22/2019   ALBUMIN 4.1 03/19/2018   CALCIUM 9.2 09/22/2019   ANIONGAP 9 03/26/2018   Lab Results  Component Value Date   CHOL 166 03/06/2019   Lab Results  Component Value Date   HDL 80 03/06/2019   Lab Results  Component Value Date   LDLCALC 71 03/06/2019   Lab Results  Component Value Date   TRIG 69 03/06/2019   Lab Results  Component Value Date   CHOLHDL 2.1 03/06/2019   Lab Results  Component Value Date   HGBA1C 5.6 06/06/2020      Assessment & Plan:   Problem List Items Addressed This Visit      Cardiovascular and Mediastinum   Essential hypertension, benign (Chronic)    Blood pressure is not well-controlled today. Prescription provided for blood pressure cuff to monitor blood pressure at home.  Would like for patient to record readings of blood pressure at home and let us know if they are running greater than 140/80 on a consistent basis. No changes to medication today until routine blood pressures are evaluated. Labs obtained today. Follow-up in 3 months or sooner if blood pressures are continue to run high.      Relevant Medications   valsartan-hydrochlorothiazide (DIOVAN-HCT) 320-25 MG tablet   simvastatin (ZOCOR) 10 MG tablet   metoprolol succinate (TOPROL-XL) 50 MG 24 hr tablet   Other Relevant Orders   CBC with Differential/Platelet   COMPLETE METABOLIC PANEL WITH GFR   Lipid panel     Endocrine   Controlled type 2 diabetes mellitus with complication, without long-term current use of insulin (HCC) - Primary    Blood sugars well controlled today with hemoglobin A1c of 5.6%. Continue current medications and diet. Refills provided today. Plan follow-up in approximately 6 months for repeat hemoglobin A1c and labs.      Relevant Medications   valsartan-hydrochlorothiazide (DIOVAN-HCT) 320-25 MG tablet   simvastatin (ZOCOR) 10 MG tablet   metFORMIN (GLUCOPHAGE-XR) 500 MG 24 hr tablet   Other  Relevant Orders   POCT glycosylated hemoglobin (Hb A1C) (Completed)   CBC with Differential/Platelet   COMPLETE METABOLIC PANEL WITH GFR   Lipid panel     Musculoskeletal and Integument   DDD (degenerative disc disease), cervical    Refills provided today for meloxicam.      Relevant Medications   meloxicam (MOBIC) 7.5 MG tablet   predniSONE (DELTASONE) 20 MG tablet   diclofenac Sodium (VOLTAREN) 1 % GEL   Eczema    Symptoms are consistent with contact dermatitis. Recommend throwing out any of the objects that have been washed with bleach and avoid utilizing bleach on clothing or fabrics that may come in contact with skin in the future. If symptoms do not improve with the removal of the suspected irritant please let me know and further evaluation will be required.      Relevant Medications   clotrimazole-betamethasone (LOTRISONE) cream     Other   Hyperlipidemia (  Chronic)    Labs obtained today. Continue simvastatin 10 mg daily as prescribed. Refills provided today.  Changes to plan of care based on lab results.      Relevant Medications   valsartan-hydrochlorothiazide (DIOVAN-HCT) 320-25 MG tablet   simvastatin (ZOCOR) 10 MG tablet   metoprolol succinate (TOPROL-XL) 50 MG 24 hr tablet   Other Relevant Orders   CBC with Differential/Platelet   COMPLETE METABOLIC PANEL WITH GFR   Lipid panel   COMPLETE METABOLIC PANEL WITH GFR   Lipid panel   Memory changes    Reported memory changes over time. Discussed with wife that a formal evaluation by neurology could be provided to determine if treatment options may be available. Referral for neurology today.      Relevant Orders   Ambulatory referral to Neurology   CBC with Differential/Platelet   COMPLETE METABOLIC PANEL WITH GFR   VITAMIN D 25 Hydroxy (Vit-D Deficiency, Fractures)   Lower urinary tract symptoms (LUTS)    Refills provided today for oxybutynin. Patient and wife report relief of symptoms with current  medication dose.      Relevant Medications   oxybutynin (DITROPAN-XL) 5 MG 24 hr tablet    Contact dermatitis Symptoms are consistent with contact dermatitis. Recommend throwing out any of the objects that have been washed with bleach and avoid utilizing bleach on clothing or fabrics that may come in contact with skin in the future. If symptoms do not improve with the removal of the suspected irritant please let me know and further evaluation will be required.  Meds ordered this encounter  Medications  . AMBULATORY NON FORMULARY MEDICATION    Sig: Blood pressure monitor.    Dispense:  1 each    Refill:  0  . meloxicam (MOBIC) 7.5 MG tablet    Sig: Take 1 tablet (7.5 mg total) by mouth daily as needed for pain.    Dispense:  90 tablet    Refill:  1  . valsartan-hydrochlorothiazide (DIOVAN-HCT) 320-25 MG tablet    Sig: Take 1 tablet by mouth daily.    Dispense:  90 tablet    Refill:  2  . simvastatin (ZOCOR) 10 MG tablet    Sig: Take 1 tablet (10 mg total) by mouth at bedtime.    Dispense:  90 tablet    Refill:  2  . metoprolol succinate (TOPROL-XL) 50 MG 24 hr tablet    Sig: Take 3 tablets (150 mg total) by mouth daily. Take with or immediately after food.    Dispense:  270 tablet    Refill:  2  . metFORMIN (GLUCOPHAGE-XR) 500 MG 24 hr tablet    Sig: TAKE 1 TABLET EVERY DAY WITH BREAKFAST    Dispense:  90 tablet    Refill:  2  . predniSONE (DELTASONE) 20 MG tablet    Sig: Take 1 tablet (20 mg total) by mouth daily with breakfast. For rash.    Dispense:  5 tablet    Refill:  0  . clotrimazole-betamethasone (LOTRISONE) cream    Sig: Apply 1 application topically 2 (two) times daily.    Dispense:  80 g    Refill:  3  . diclofenac Sodium (VOLTAREN) 1 % GEL    Sig: Apply 2 g topically 4 (four) times daily. To affected joint.    Dispense:  100 g    Refill:  11  . oxybutynin (DITROPAN-XL) 5 MG 24 hr tablet    Sig: Take 1 tablet (5 mg total) by mouth  at bedtime.    Dispense:   90 tablet    Refill:  3    Follow-up: Return in 3 months (on 09/06/2020).    Tollie Eth, NP

## 2020-06-06 NOTE — Assessment & Plan Note (Signed)
Blood sugars well controlled today with hemoglobin A1c of 5.6%. Continue current medications and diet. Refills provided today. Plan follow-up in approximately 6 months for repeat hemoglobin A1c and labs.

## 2020-06-06 NOTE — Assessment & Plan Note (Signed)
Blood pressure is not well-controlled today. Prescription provided for blood pressure cuff to monitor blood pressure at home.  Would like for patient to record readings of blood pressure at home and let us know if they are running greater than 140/80 on a consistent basis. No changes to medication today until routine blood pressures are evaluated. Labs obtained today. Follow-up in 3 months or sooner if blood pressures are continue to run high.

## 2020-06-06 NOTE — Assessment & Plan Note (Signed)
Labs obtained today. Continue simvastatin 10 mg daily as prescribed. Refills provided today.  Changes to plan of care based on lab results.

## 2020-06-06 NOTE — Assessment & Plan Note (Signed)
Refills provided today for oxybutynin. Patient and wife report relief of symptoms with current medication dose.

## 2020-06-06 NOTE — Assessment & Plan Note (Signed)
Refills provided today on Lotrisone cream for eczema symptoms.

## 2020-06-06 NOTE — Patient Instructions (Signed)

## 2020-06-07 ENCOUNTER — Other Ambulatory Visit: Payer: Self-pay | Admitting: Nurse Practitioner

## 2020-06-07 DIAGNOSIS — I10 Essential (primary) hypertension: Secondary | ICD-10-CM

## 2020-06-07 DIAGNOSIS — E871 Hypo-osmolality and hyponatremia: Secondary | ICD-10-CM

## 2020-06-07 LAB — COMPLETE METABOLIC PANEL WITH GFR
AG Ratio: 1.4 (calc) (ref 1.0–2.5)
ALT: 17 U/L (ref 9–46)
AST: 26 U/L (ref 10–35)
Albumin: 4.6 g/dL (ref 3.6–5.1)
Alkaline phosphatase (APISO): 119 U/L (ref 35–144)
BUN: 11 mg/dL (ref 7–25)
CO2: 29 mmol/L (ref 20–32)
Calcium: 9.9 mg/dL (ref 8.6–10.3)
Chloride: 88 mmol/L — ABNORMAL LOW (ref 98–110)
Creat: 0.94 mg/dL (ref 0.70–1.18)
GFR, Est African American: 92 mL/min/{1.73_m2} (ref >=60)
GFR, Est Non African American: 80 mL/min/{1.73_m2} (ref >=60)
Globulin: 3.3 g/dL (calc) (ref 1.9–3.7)
Glucose, Bld: 97 mg/dL (ref 65–99)
Potassium: 4 mmol/L (ref 3.5–5.3)
Sodium: 127 mmol/L — ABNORMAL LOW (ref 135–146)
Total Bilirubin: 0.8 mg/dL (ref 0.2–1.2)
Total Protein: 7.9 g/dL (ref 6.1–8.1)

## 2020-06-07 LAB — CBC WITH DIFFERENTIAL/PLATELET
Absolute Monocytes: 380 cells/uL (ref 200–950)
Basophils Absolute: 50 cells/uL (ref 0–200)
Basophils Relative: 0.9 %
Eosinophils Absolute: 259 cells/uL (ref 15–500)
Eosinophils Relative: 4.7 %
HCT: 43.3 % (ref 38.5–50.0)
Hemoglobin: 15.1 g/dL (ref 13.2–17.1)
Lymphs Abs: 1722 cells/uL (ref 850–3900)
MCH: 32.5 pg (ref 27.0–33.0)
MCHC: 34.9 g/dL (ref 32.0–36.0)
MCV: 93.3 fL (ref 80.0–100.0)
MPV: 9.3 fL (ref 7.5–12.5)
Monocytes Relative: 6.9 %
Neutro Abs: 3091 cells/uL (ref 1500–7800)
Neutrophils Relative %: 56.2 %
Platelets: 195 10*3/uL (ref 140–400)
RBC: 4.64 10*6/uL (ref 4.20–5.80)
RDW: 13.1 % (ref 11.0–15.0)
Total Lymphocyte: 31.3 %
WBC: 5.5 10*3/uL (ref 3.8–10.8)

## 2020-06-07 LAB — LIPID PANEL
Cholesterol: 193 mg/dL (ref <200)
HDL: 99 mg/dL (ref >=40)
LDL Cholesterol (Calc): 80 mg/dL (calc)
Non-HDL Cholesterol (Calc): 94 mg/dL (calc) (ref <130)
Total CHOL/HDL Ratio: 1.9 (calc) (ref <5.0)
Triglycerides: 62 mg/dL (ref <150)

## 2020-06-07 MED ORDER — AMLODIPINE BESYLATE-VALSARTAN 10-160 MG PO TABS: 1.0000 | ORAL_TABLET | Freq: Every day | ORAL | 3 refills | Status: DC

## 2020-06-07 MED ORDER — AMLODIPINE BESYLATE-VALSARTAN 10-160 MG PO TABS: 1.0000 | ORAL_TABLET | Freq: Every day | ORAL | 0 refills | Status: DC

## 2020-06-07 NOTE — Progress Notes (Addendum)
Labs have come back. Sodium level is very low. This could be causing some of the memory problems. I would like to change the valsartan-hydrochlorothiazide medication for blood pressure, because this can certainly cause the low sodium. I will cancel the refill for this medication and send in a new medication for blood pressure.   I would like you to drink "electrolyte" containing drinks, such as gatorade with no sugar to try to get these sodium levels up. We will plan to recheck these levels in 1 week.   Stop Valsartan-HCTZ today. New BP, (amlodipine/valsartan) medication sent to local pharmacy [facility] on file) to start tomorrow and then to mail order pharmacy for long term.  Recheck metabolic panel in 1 week.   Addendum 06/08/2020: Based on low sodium levels, strongly recommend immediately stopping valsartan-hctz and starting new blood pressure medication. I do not feel it would be safe to stop blood pressure medication altogether at this time due to hypertension noted at last office visit.  Repeat call to patient and his wife encouraging recommendation listed above.

## 2020-06-14 ENCOUNTER — Telehealth: Payer: Self-pay

## 2020-06-14 NOTE — Telephone Encounter (Signed)
No answer, no voicemail. Will try to call patient back at a later time. (1st attempt)  Pt's wife [name] (on DPR//ROI/HIPPA form) called and LVM requesting someone call her back regarding the new Rx that Romero was started on. Based on recent labs, Zachary Lyons stopped his valsartan-HCTZ and started him on amlodipine/valsartan. She did not stated on her VM what the concern was.

## 2020-06-15 NOTE — Telephone Encounter (Signed)
No answer, no voicemail. Will try to call patient back at a later time. (2nd attempt)  

## 2020-06-16 NOTE — Telephone Encounter (Signed)
No answer, no voicemail. (3rd attempt). Multiple attempts to contact pt/wife, all unsuccessful.

## 2020-06-16 NOTE — Telephone Encounter (Signed)
Spoke with patient's wife and clarified her questions of what patient's new medication was called and what pharmacy it was sent to. No further needs at this time.

## 2020-07-19 ENCOUNTER — Other Ambulatory Visit: Payer: Self-pay

## 2020-07-19 DIAGNOSIS — I10 Essential (primary) hypertension: Secondary | ICD-10-CM

## 2020-07-19 DIAGNOSIS — E871 Hypo-osmolality and hyponatremia: Secondary | ICD-10-CM | POA: Diagnosis not present

## 2020-07-20 LAB — COMPLETE METABOLIC PANEL WITH GFR
AG Ratio: 1.4 (calc) (ref 1.0–2.5)
ALT: 17 U/L (ref 9–46)
AST: 27 U/L (ref 10–35)
Albumin: 4.4 g/dL (ref 3.6–5.1)
Alkaline phosphatase (APISO): 102 U/L (ref 35–144)
BUN: 9 mg/dL (ref 7–25)
CO2: 23 mmol/L (ref 20–32)
Calcium: 9.4 mg/dL (ref 8.6–10.3)
Chloride: 102 mmol/L (ref 98–110)
Creat: 0.92 mg/dL (ref 0.70–1.18)
GFR, Est African American: 95 mL/min/{1.73_m2} (ref >=60)
GFR, Est Non African American: 82 mL/min/{1.73_m2} (ref >=60)
Globulin: 3.1 g/dL (calc) (ref 1.9–3.7)
Glucose, Bld: 119 mg/dL — ABNORMAL HIGH (ref 65–99)
Potassium: 3.9 mmol/L (ref 3.5–5.3)
Sodium: 136 mmol/L (ref 135–146)
Total Bilirubin: 0.5 mg/dL (ref 0.2–1.2)
Total Protein: 7.5 g/dL (ref 6.1–8.1)

## 2020-07-20 NOTE — Progress Notes (Signed)
Sodium levels are greatly improved! Keep up what you are doing so we can keep this up.

## 2020-09-06 ENCOUNTER — Encounter: Payer: Self-pay | Admitting: Family Medicine

## 2020-09-06 ENCOUNTER — Ambulatory Visit (INDEPENDENT_AMBULATORY_CARE_PROVIDER_SITE_OTHER): Payer: Medicare HMO | Admitting: Family Medicine

## 2020-09-06 ENCOUNTER — Encounter (INDEPENDENT_AMBULATORY_CARE_PROVIDER_SITE_OTHER): Payer: Self-pay

## 2020-09-06 VITALS — BP 116/69 | HR 57 | Wt 153.9 lb

## 2020-09-06 DIAGNOSIS — I10 Essential (primary) hypertension: Secondary | ICD-10-CM

## 2020-09-06 DIAGNOSIS — R600 Localized edema: Secondary | ICD-10-CM

## 2020-09-06 NOTE — Patient Instructions (Signed)
Amlodipine; Valsartan Oral Tablet What is this medicine? AMLODIPINE; VALSARTAN (am LOE di peen; val SAR tan) is a combination of a calcium channel blocker and an angiotensin II receptor blocker. It treats high blood pressure. This medicine may be used for other purposes; ask your health care provider or pharmacist if you have questions. COMMON BRAND NAME(S): Exforge What should I tell my health care provider before I take this medicine? They need to know if you have any of these conditions:  heart failure, recent heart attack, or other heart problems  kidney disease  liver disease  an unusual or allergic reaction to amlodipine, valsartan, other medicines, foods, dyes, or preservatives  pregnant or trying to get pregnant  breast-feeding How should I use this medicine? Take this drug by mouth. Take it as directed on the prescription label at the same time every day. You can take it with or without food. If it upsets your stomach, take it with food. Keep taking it unless your health care provider tells you to stop. Talk to your health care provider about the use of this drug in children. Special care may be needed. Overdosage: If you think you have taken too much of this medicine contact a poison control center or emergency room at once. NOTE: This medicine is only for you. Do not share this medicine with others. What if I miss a dose? If you miss a dose, take it as soon as you can. If it is almost time for your next dose, take only that dose. Do not take double or extra doses. What may interact with this medicine?  ACE inhibitors like captopril, enalapril, or lisinopril  aliskiren  angiotensin II blockers (ARBs) like candesartan, losartan, or telmisartan  cyclosporine  heparin  lithium  NSAIDS, medicines for pain and inflammation, like ibuprofen or naproxen  potassium supplements  rifampin  ritonavir  salt substitutes  sildenafil  simvastatin  some diuretics like  amiloride, spironolactone, triamterene  tacrolimus This list may not describe all possible interactions. Give your health care provider a list of all the medicines, herbs, non-prescription drugs, or dietary supplements you use. Also tell them if you smoke, drink alcohol, or use illegal drugs. Some items may interact with your medicine. What should I watch for while using this medicine? Visit your doctor or health care professional for regular checks on your progress. Check your blood pressure as directed. Ask your doctor or health care professional what your blood pressure should be and when you should contact him or her. Women should inform their doctor if they wish to become pregnant or think they might be pregnant. There is a potential for serious side effects to an unborn child. Talk to your health care professional or pharmacist for more information. You may get drowsy or dizzy. Do not drive, use machinery, or do anything that needs mental alertness until you know how this medicine affects you. Do not stand or sit up quickly, especially if you are an older patient. This reduces the risk of dizzy or fainting spells. Alcohol may interfere with the effect of this medicine. Avoid alcoholic drinks. If you are going to have surgery, tell your prescriber or health care professional that you are taking this medicine. What side effects may I notice from receiving this medicine? Side effects that you should report to your doctor or health care professional as soon as possible:  allergic reactions like skin rash, itching or hives, swelling of the face, lips, or tongue  breathing problems  chest pain  confusion  fast, irregular heartbeat  feeling faint or lightheaded, falls  low blood pressure  swelling of the hands, ankles, or feet  trouble passing urine or change in the amount of urine Side effects that usually do not require medical attention (report to your doctor or health care  professional if they continue or are bothersome):  change in sex drive or performance  cough  diarrhea  flushing of face, skin  headache  nausea, vomiting  stomach gas, pain  weak or tired This list may not describe all possible side effects. Call your doctor for medical advice about side effects. You may report side effects to FDA at 1-800-FDA-1088. Where should I keep my medicine? Keep out of the reach of children and pets. Store at room temperature between 15 and 30 degrees C (59 and 86 degrees F). Protect from moisture. Keep the container tightly closed. Throw away any unused drug after the expiration date. NOTE: This sheet is a summary. It may not cover all possible information. If you have questions about this medicine, talk to your doctor, pharmacist, or health care provider.  2021 Elsevier/Gold Standard (2019-02-02 12:41:51)

## 2020-09-06 NOTE — Progress Notes (Signed)
Acute Office Visit  Subjective:    Patient ID: Zachary Lyons, male    DOB: 02/15/1946, 75 y.o.   MRN: [id number]  Chief Complaint  Patient presents with  . Leg Swelling    HPI Patient is in today for bilateral lower extremity edema. Wife states they started to notice the swelling about 1-2 weeks ago. He had been taking HCTZ-valsartan for uncontrolled blood pressure, but started developing some confusion and was found to be hyponatremic. He was switched to amlodipine-valsartan a couple months ago and stated eating excessive sodium per wife. His sodium was normal in January. BP today is the best it has been in any recent office visits, but they have not been checking it at home.   Swelling described as bilateral lower extremity swelling that stays consistent all day. Patient states he noticed increased swelling and some "tiredness" in his legs after he has been standing for awhile. They have not yet tried anything for the swelling. He denies leg pain, weeping, chest pain, shortness of breath, changes in urination.   Past Medical History:  Diagnosis Date  . Arthritis    osteoarthritis  . GERD (gastroesophageal reflux disease)   . Hypertension   . Nasal polyps    removed  . Peptic ulcer    hx. of  . Pre-diabetes   . Transfusion history 04-22-13   15 yrs ago-"bleeding ulcer"    Past Surgical History:  Procedure Laterality Date  . CATARACT EXTRACTION Left 04-22-13  . COLONOSCOPY    . JOINT REPLACEMENT     Right total knee arthroplasty  . NASAL SINUS SURGERY     polyps revoved 08-2017  . STOMACH SURGERY     oversew of bleeding ulcer  . TOTAL KNEE ARTHROPLASTY Left 04/27/2013   Procedure: LEFT TOTAL KNEE ARTHROPLASTY;  Surgeon: Loanne Drilling, MD;  Location: [facility];  Service: Orthopedics;  Laterality: Left;  . TOTAL KNEE ARTHROPLASTY Right 03/24/2018   Procedure: RIGHT TOTAL KNEE ARTHROPLASTY;  Surgeon: Ollen Gross, MD;  Location: [facility];  Service: Orthopedics;   Laterality: Right;    History reviewed. No pertinent family history.  Social History   Socioeconomic History  . Marital status: Married    Spouse name: Not on file  . Number of children: Not on file  . Years of education: Not on file  . Highest education level: Not on file  Occupational History  . Not on file  Tobacco Use  . Smoking status: Former Smoker    Quit date: 04/23/1979    Years since quitting: 41.4  . Smokeless tobacco: Never Used  Vaping Use  . Vaping Use: Never used  Substance and Sexual Activity  . Alcohol use: Yes    Alcohol/week: 6.0 standard drinks    Types: 6 Cans of beer per week    Comment: occasional  . Drug use: No  . Sexual activity: Yes    Partners: Female  Other Topics Concern  . Not on file  Social History Narrative  . Not on file   Social Determinants of Health   Financial Resource Strain: Not on file  Food Insecurity: Not on file  Transportation Needs: Not on file  Physical Activity: Not on file  Stress: Not on file  Social Connections: Not on file  Intimate Partner Violence: Not on file    Outpatient Medications Prior to Visit  Medication Sig Dispense Refill  . AMBULATORY NON FORMULARY MEDICATION Blood pressure monitor. 1 each 0  . amLODipine-valsartan (EXFORGE) 10-160 MG tablet  Take 1 tablet by mouth daily. 30 tablet 0  . amLODipine-valsartan (EXFORGE) 10-160 MG tablet Take 1 tablet by mouth daily. 90 tablet 3  . clotrimazole-betamethasone (LOTRISONE) cream Apply 1 application topically 2 (two) times daily. 80 g 3  . diclofenac sodium (VOLTAREN) 1 % GEL APPLY 4 GRAMS TOPICALLY 4 TIMES DAILY TO AFFECTED JOINT. 200 g 0  . diclofenac Sodium (VOLTAREN) 1 % GEL Apply 2 g topically 4 (four) times daily. To affected joint. 100 g 11  . famotidine (PEPCID) 40 MG tablet Take 1 tablet (40 mg total) by mouth daily. 90 tablet 2  . meloxicam (MOBIC) 7.5 MG tablet Take 1 tablet (7.5 mg total) by mouth daily as needed for pain. 90 tablet 1  .  metFORMIN (GLUCOPHAGE-XR) 500 MG 24 hr tablet TAKE 1 TABLET EVERY DAY WITH BREAKFAST 90 tablet 2  . metoprolol succinate (TOPROL-XL) 50 MG 24 hr tablet Take 3 tablets (150 mg total) by mouth daily. Take with or immediately after food. 270 tablet 2  . omeprazole (PRILOSEC) 40 MG capsule Take 1 capsule (40 mg total) by mouth daily. 90 capsule 2  . oxybutynin (DITROPAN-XL) 5 MG 24 hr tablet Take 1 tablet (5 mg total) by mouth at bedtime. 90 tablet 3  . predniSONE (DELTASONE) 20 MG tablet Take 1 tablet (20 mg total) by mouth daily with breakfast. For rash. 5 tablet 0  . simvastatin (ZOCOR) 10 MG tablet Take 1 tablet (10 mg total) by mouth at bedtime. 90 tablet 2  . acetaminophen-codeine (TYLENOL #3) 300-30 MG tablet     . amoxicillin (AMOXIL) 500 MG capsule      No facility-administered medications prior to visit.    No Known Allergies  Review of Systems All review of systems negative except what is listed in the HPI     Objective:    Physical Exam Constitutional:      Appearance: Normal appearance. He is normal weight.  HENT:     Head: Normocephalic and atraumatic.  Cardiovascular:     Rate and Rhythm: Normal rate and regular rhythm.     Pulses: Normal pulses.     Heart sounds: Normal heart sounds.  Pulmonary:     Effort: Pulmonary effort is normal.     Breath sounds: Normal breath sounds. No wheezing or rales.  Musculoskeletal:     Right lower leg: Edema present.     Left lower leg: Edema present.     Comments: Bilateral 1+ lower extremity edema without discoloration or weeping  Skin:    General: Skin is warm and dry.     Capillary Refill: Capillary refill takes less than 2 seconds.  Neurological:     General: No focal deficit present.     Mental Status: He is alert and oriented to person, place, and time. Mental status is at baseline.  Psychiatric:        Mood and Affect: Mood normal.        Behavior: Behavior normal.        Thought Content: Thought content normal.         Judgment: Judgment normal.     BP 116/69   Pulse (!) 57   Wt 153 lb 14.4 oz (69.8 kg)   SpO2 95%   BMI 27.26 kg/m  Wt Readings from Last 3 Encounters:  09/06/20 153 lb 14.4 oz (69.8 kg)  06/06/20 151 lb 3.2 oz (68.6 kg)  09/22/19 161 lb (73 kg)    Health Maintenance Due  Topic Date  Due  . OPHTHALMOLOGY EXAM  Never done    There are no preventive care reminders to display for this patient.   Lab Results  Component Value Date   TSH 1.14 01/31/2018   Lab Results  Component Value Date   WBC 5.5 06/06/2020   HGB 15.1 06/06/2020   HCT 43.3 06/06/2020   MCV 93.3 06/06/2020   PLT 195 06/06/2020   Lab Results  Component Value Date   NA 136 07/19/2020   K 3.9 07/19/2020   CO2 23 07/19/2020   GLUCOSE 119 (H) 07/19/2020   BUN 9 07/19/2020   CREATININE 0.92 07/19/2020   BILITOT 0.5 07/19/2020   ALKPHOS 108 03/19/2018   AST 27 07/19/2020   ALT 17 07/19/2020   PROT 7.5 07/19/2020   ALBUMIN 4.1 03/19/2018   CALCIUM 9.4 07/19/2020   ANIONGAP 9 03/26/2018   Lab Results  Component Value Date   CHOL 193 06/06/2020   Lab Results  Component Value Date   HDL 99 06/06/2020   Lab Results  Component Value Date   LDLCALC 80 06/06/2020   Lab Results  Component Value Date   TRIG 62 06/06/2020   Lab Results  Component Value Date   CHOLHDL 1.9 06/06/2020   Lab Results  Component Value Date   HGBA1C 5.6 06/06/2020       Assessment & Plan:   Bilateral lower extremity edema - Plan: Brain natriuretic peptide, COMPLETE METABOLIC PANEL WITH GFR  Essential hypertension - Plan: Brain natriuretic peptide, COMPLETE METABOLIC PANEL WITH GFR  Patient's BLE edema is likely related to his recent switch to amlodipine. No other concerning findings on exam, but will check BNP today. Patient could also be getting too much sodium intake as he states he has been a little excessive with adding sodium ever since he was told his sodium was low. We will recheck CMP today. Given that  amlodipine is the likely culprit, patient is agreeable to try compression socks and not add excessive amounts of sodium to his diet (will let him know if plan changes based on lab results). BP is currently the best it has been in awhile, another reason we do not want to change meds if not necessary. Encouraged patient to monitor blood pressure at home occasionally. He will let us know if the edema worsens or new symptoms develop.   Follow-up in 1-2 weeks if symptoms are not improving or worsen. Will update patient on any changes to plan of care pending lab results.    Clayborne Dana, NP

## 2020-09-07 ENCOUNTER — Ambulatory Visit (INDEPENDENT_AMBULATORY_CARE_PROVIDER_SITE_OTHER): Payer: Medicare HMO | Admitting: Medical-Surgical

## 2020-09-07 ENCOUNTER — Other Ambulatory Visit: Payer: Self-pay

## 2020-09-07 VITALS — BP 122/67 | HR 59 | Temp 98.2°F | Resp 16 | Ht 63.0 in | Wt 153.0 lb

## 2020-09-07 DIAGNOSIS — Z0000 Encounter for general adult medical examination without abnormal findings: Secondary | ICD-10-CM

## 2020-09-07 LAB — COMPLETE METABOLIC PANEL WITH GFR
AG Ratio: 1.6 (calc) (ref 1.0–2.5)
ALT: 11 U/L (ref 9–46)
AST: 20 U/L (ref 10–35)
Albumin: 4.2 g/dL (ref 3.6–5.1)
Alkaline phosphatase (APISO): 93 U/L (ref 35–144)
BUN: 9 mg/dL (ref 7–25)
CO2: 31 mmol/L (ref 20–32)
Calcium: 9 mg/dL (ref 8.6–10.3)
Chloride: 101 mmol/L (ref 98–110)
Creat: 1 mg/dL (ref 0.70–1.18)
GFR, Est African American: 86 mL/min/{1.73_m2} (ref >=60)
GFR, Est Non African American: 74 mL/min/{1.73_m2} (ref >=60)
Globulin: 2.7 g/dL (calc) (ref 1.9–3.7)
Glucose, Bld: 110 mg/dL (ref 65–139)
Potassium: 4.1 mmol/L (ref 3.5–5.3)
Sodium: 138 mmol/L (ref 135–146)
Total Bilirubin: 0.5 mg/dL (ref 0.2–1.2)
Total Protein: 6.9 g/dL (ref 6.1–8.1)

## 2020-09-07 LAB — BRAIN NATRIURETIC PEPTIDE: Brain Natriuretic Peptide: 52 pg/mL (ref <100)

## 2020-09-07 IMAGING — DX DG THORACIC SPINE 3V
3 series · 3 of 3 positions shown · non-contrast
Comparison: 07/25/2017 chest x-ray.

CLINICAL DATA: 72-year-old male with mid to upper back pain for 2
years. Initial encounter.

EXAM:
THORACIC SPINE - 3 VIEWS

[t-spine ap]
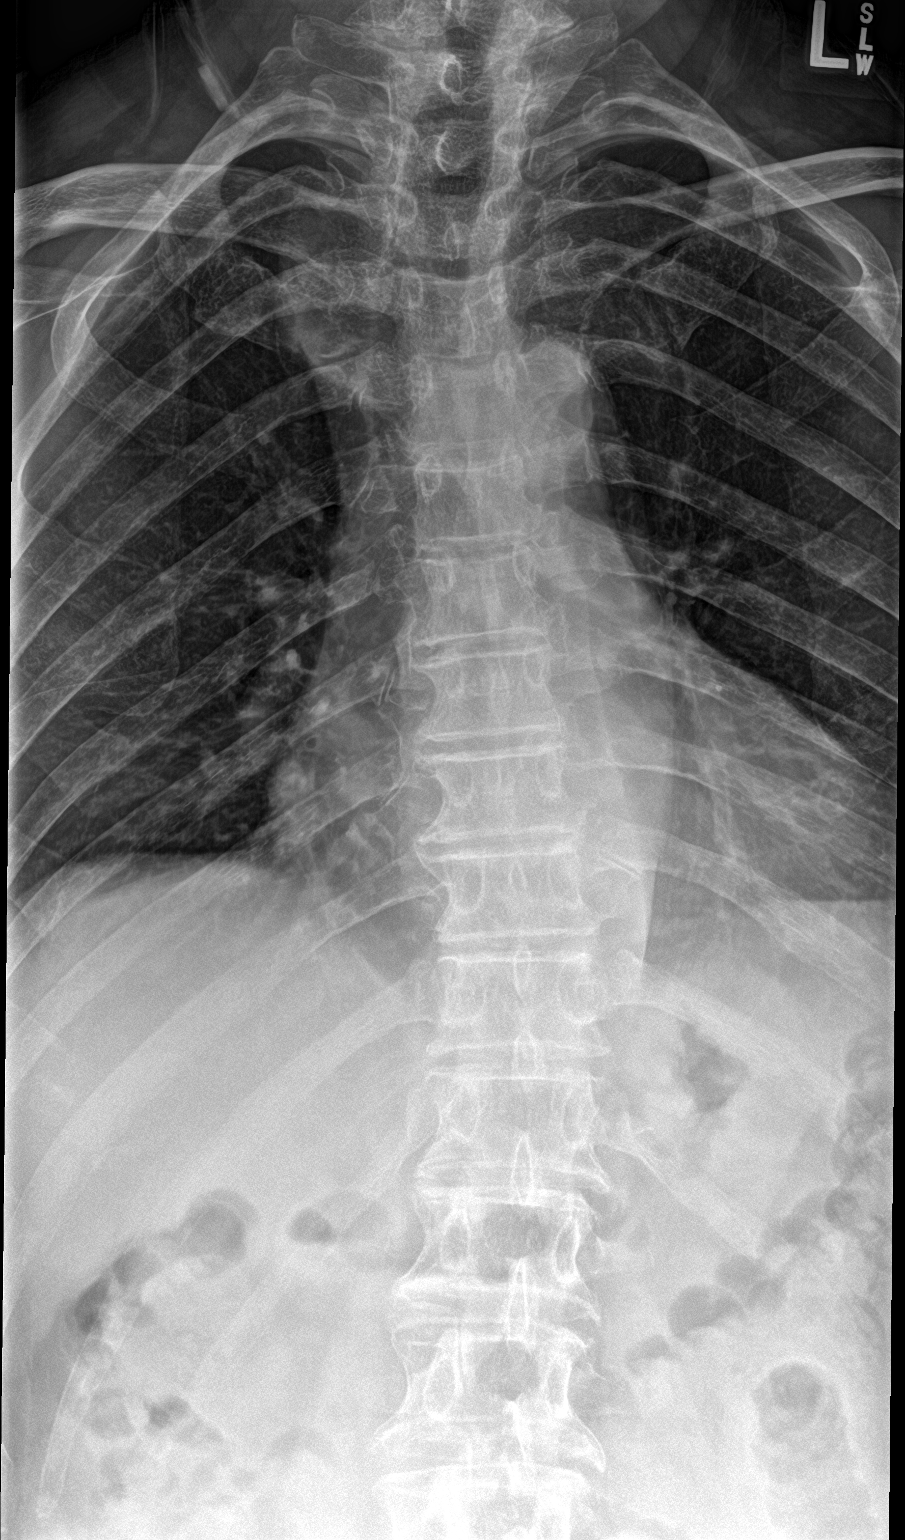

[t-spine lat]
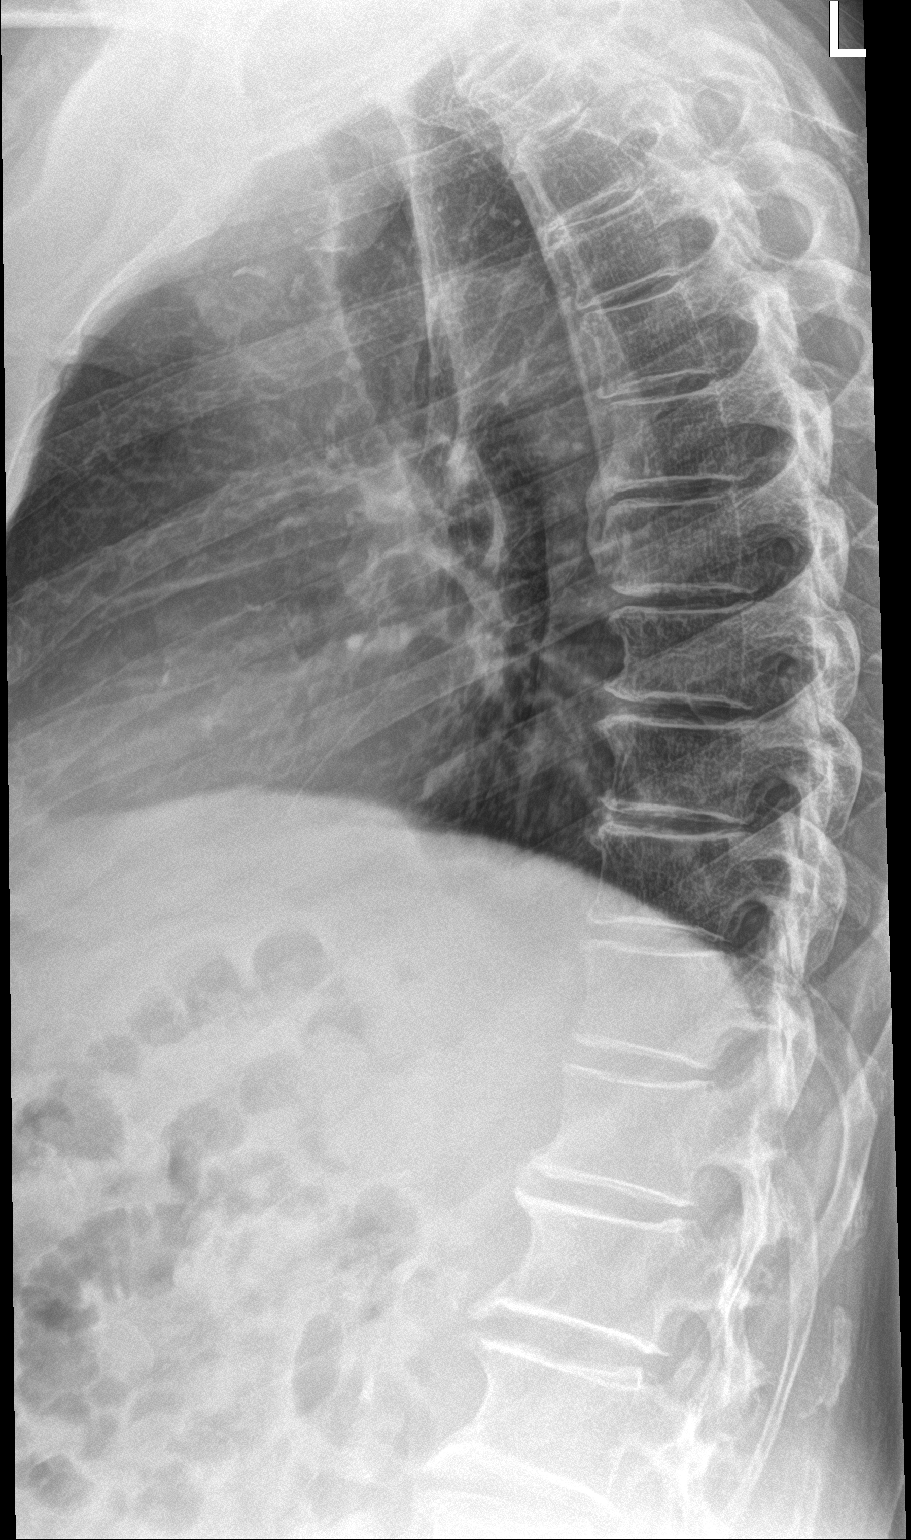

[t-spine swimmers]
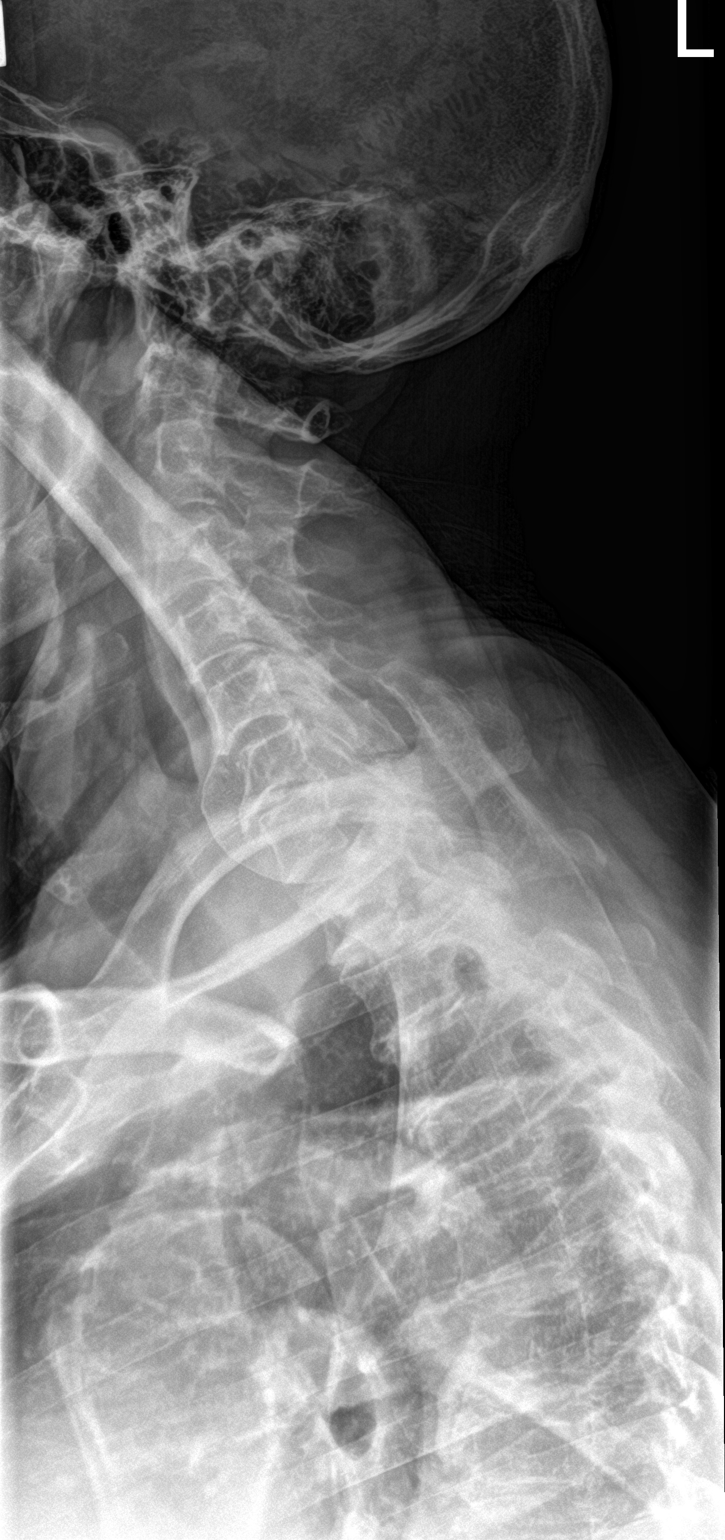

[3 of 3 positions shown; findings below may reference images not displayed]

FINDINGS: S-shaped scoliosis thoracic spine with superimposed minimal
degenerative changes. No compression fracture. Degenerative changes
C6-7 and upper lumbar spine.
IMPRESSION: 1. S-shaped scoliosis of the thoracic spine with superimposed
minimal degenerative changes.
2. No compression fracture.
3. Degenerative changes C6-7 disc space and upper lumbar spine.

## 2020-09-07 NOTE — Progress Notes (Signed)
Your labs look great! I think the new blood pressure med is helpful overall, despite causing some swelling. Let's try the compression socks and see if this will keep the swelling minimal. Your sodium looks good today. Please try to check your blood pressure at home at least a couple times per week and let us know what some of those readings are. Come see [place] if your swelling worsens or you get any new symptoms.

## 2020-09-07 NOTE — Patient Instructions (Addendum)
Colonoscopy, Adult A colonoscopy is a procedure to look at the entire large intestine. This procedure is done using a long, thin, flexible tube that has a camera on the end. You may have a colonoscopy:  As a part of normal colorectal screening.  If you have certain symptoms, such as: ? A low number of red blood cells in your blood (anemia). ? Diarrhea that does not go away. ? Pain in your abdomen. ? Blood in your stool. A colonoscopy can help screen for and diagnose medical problems, including:  Tumors.  Extra tissue that grows where mucus forms (polyps).  Inflammation.  Areas of bleeding. Tell your health care provider about:  Any allergies you have.  All medicines you are taking, including vitamins, herbs, eye drops, creams, and over-the-counter medicines.  Any problems you or family members have had with anesthetic medicines.  Any blood disorders you have.  Any surgeries you have had.  Any medical conditions you have.  Any problems you have had with having bowel movements.  Whether you are pregnant or may be pregnant. What are the risks? Generally, this is a safe procedure. However, problems may occur, including:  Bleeding.  Damage to your intestine.  Allergic reactions to medicines given during the procedure.  Infection. This is rare. What happens before the procedure? Eating and drinking restrictions Follow instructions from your health care provider about eating or drinking restrictions, which may include:  A few days before the procedure: ? Follow a low-fiber diet. ? Avoid nuts, seeds, dried fruit, raw fruits, and vegetables.  1-3 days before the procedure: ? Eat only gelatin dessert or ice pops. ? Drink only clear liquids, such as water, clear juice, clear broth or bouillon, black coffee or tea, or clear soft drinks or sports drinks. ? Avoid liquids that contain red or purple dye.  The day of the procedure: ? Do not eat solid foods. You may  continue to drink clear liquids until up to 2 hours before the procedure. ? Do not eat or drink anything starting 2 hours before the procedure, or within the time period that your health care provider recommends. Bowel prep If you were prescribed a bowel prep to take by mouth (orally) to clean out your colon:  Take it as told by your health care provider. Starting the day before your procedure, you will need to drink a large amount of liquid medicine. The liquid will cause you to have many bowel movements of loose stool until your stool becomes almost clear or light green.  If your skin or the opening between the buttocks (anus) gets irritated from diarrhea, you may relieve the irritation using: ? Wipes with medicine in them, such as adult wet wipes with aloe and vitamin E. ? A product to soothe skin, such as petroleum jelly.  If you vomit while drinking the bowel prep: ? Take a break for up to 60 minutes. ? Begin the bowel prep again. ? Call your health care provider if you keep vomiting or you cannot take the bowel prep without vomiting.  To clean out your colon, you may also be given: ? Laxative medicines. These help you have a bowel movement. ? Instructions for enema use. An enema is liquid medicine injected into your rectum. Medicines Ask your health care provider about:  Changing or stopping your regular medicines or supplements. This is especially important if you are taking iron supplements, diabetes medicines, or blood thinners.  Taking medicines such as aspirin and ibuprofen. These  medicines can thin your blood. Do not take these medicines unless your health care provider tells you to take them.  Taking over-the-counter medicines, vitamins, herbs, and supplements. General instructions  Ask your health care provider what steps will be taken to help prevent infection. These may include washing skin with a germ-killing soap.  Plan to have someone take you home from the hospital  or clinic. What happens during the procedure?  An IV will be inserted into one of your veins.  You may be given one or more of the following: ? A medicine to help you relax (sedative). ? A medicine to numb the area (local anesthetic). ? A medicine to make you fall asleep (general anesthetic). This is rarely needed.  You will lie on your side with your knees bent.  The tube will: ? Have oil or gel put on it (be lubricated). ? Be inserted into your anus. ? Be gently eased through all parts of your large intestine.  Air will be sent into your colon to keep it open. This may cause some pressure or cramping.  Images will be taken with the camera and will appear on a screen.  A small tissue sample may be removed to be looked at under a microscope (biopsy). The tissue may be sent to a lab for testing if any signs of problems are found.  If small polyps are found, they may be removed and checked for cancer cells.  When the procedure is finished, the tube will be removed. The procedure may vary among health care providers and hospitals.   What happens after the procedure?  Your blood pressure, heart rate, breathing rate, and blood oxygen level will be monitored until you leave the hospital or clinic.  You may have a small amount of blood in your stool.  You may pass gas and have mild cramping or bloating in your abdomen. This is caused by the air that was used to open your colon during the exam.  Do not drive for 24 hours after the procedure.  It is up to you to get the results of your procedure. Ask your health care provider, or the department that is doing the procedure, when your results will be ready. Summary  A colonoscopy is a procedure to look at the entire large intestine.  Follow instructions from your health care provider about eating and drinking before the procedure.  If you were prescribed an oral bowel prep to clean out your colon, take it as told by your health care  provider.  During the colonoscopy, a flexible tube with a camera on its end is inserted into the anus and then passed into the other parts of the large intestine. This information is not intended to replace advice given to you by your health care provider. Make sure you discuss any questions you have with your health care provider. Document Revised: 01/23/2019 Document Reviewed: 01/23/2019 Elsevier Patient Education  [place].   Diabetes Mellitus and [place] care is an important part of your health, especially when you have diabetes. Diabetes may cause you to have problems because of poor blood flow (circulation) to your feet and legs, which can cause your skin to:  Become thinner and drier.  Break more easily.  Heal more slowly.  Peel and crack. You may also have nerve damage (neuropathy) in your legs and feet, causing decreased feeling in them. This means that you may not notice minor injuries to your feet that could  lead to more serious problems. Noticing and addressing any potential problems early is the best way to prevent future foot problems. How to care for your feet Foot hygiene  Wash your feet daily with warm water and mild soap. Do not use hot water. Then, pat your feet and the areas between your toes until they are completely dry. Do not soak your feet as this can dry your skin.  Trim your toenails straight across. Do not dig under them or around the cuticle. File the edges of your nails with an emery board or nail file.  Apply a moisturizing lotion or petroleum jelly to the skin on your feet and to dry, brittle toenails. Use lotion that does not contain alcohol and is unscented. Do not apply lotion between your toes.   Shoes and socks  Wear clean socks or stockings every day. Make sure they are not too tight. Do not wear knee-high stockings since they may decrease blood flow to your legs.  Wear shoes that fit properly and have enough cushioning. Always  look in your shoes before you put them on to be sure there are no objects inside.  To break in new shoes, wear them for just a few hours a day. This prevents injuries on your feet. Wounds, scrapes, corns, and calluses  Check your feet daily for blisters, cuts, bruises, sores, and redness. If you cannot see the bottom of your feet, use a mirror or ask someone for help.  Do not cut corns or calluses or try to remove them with medicine.  If you find a minor scrape, cut, or break in the skin on your feet, keep it and the skin around it clean and dry. You may clean these areas with mild soap and water. Do not clean the area with peroxide, alcohol, or iodine.  If you have a wound, scrape, corn, or callus on your foot, look at it several times a day to make sure it is healing and not infected. Check for: ? Redness, swelling, or pain. ? Fluid or blood. ? Warmth. ? Pus or a bad smell.   General tips  Do not cross your legs. This may decrease blood flow to your feet.  Do not use heating pads or hot water bottles on your feet. They may burn your skin. If you have lost feeling in your feet or legs, you may not know this is happening until it is too late.  Protect your feet from hot and cold by wearing shoes, such as at the beach or on hot pavement.  Schedule a complete foot exam at least once a year (annually) or more often if you have foot problems. Report any cuts, sores, or bruises to your health care provider immediately. Where to find more information  American Diabetes Association: www.diabetes.org  Association of Diabetes Care & Education Specialists: www.diabeteseducator.org Contact a health care provider if:  You have a medical condition that increases your risk of infection and you have any cuts, sores, or bruises on your feet.  You have an injury that is not healing.  You have redness on your legs or feet.  You feel burning or tingling in your legs or feet.  You have pain or  cramps in your legs and feet.  Your legs or feet are numb.  Your feet always feel cold.  You have pain around any toenails. Get help right away if:  You have a wound, scrape, corn, or callus on your foot and: ? You  have pain, swelling, or redness that gets worse. ? You have fluid or blood coming from the wound, scrape, corn, or callus. ? Your wound, scrape, corn, or callus feels warm to the touch. ? You have pus or a bad smell coming from the wound, scrape, corn, or callus. ? You have a fever. ? You have a red line going up your leg. Summary  Check your feet every day for blisters, cuts, bruises, sores, and redness.  Apply a moisturizing lotion or petroleum jelly to the skin on your feet and to dry, brittle toenails.  Wear shoes that fit properly and have enough cushioning.  If you have foot problems, report any cuts, sores, or bruises to your health care provider immediately.  Schedule a complete foot exam at least once a year (annually) or more often if you have foot problems. This information is not intended to replace advice given to you by your health care provider. Make sure you discuss any questions you have with your health care provider. Document Revised: 01/21/2020 Document Reviewed: 01/21/2020 Elsevier Patient Education  [place] Maintenance, Male Adopting a healthy lifestyle and getting preventive care are important in promoting health and wellness. Ask your health care provider about:  The right schedule for you to have regular tests and exams.  Things you can do on your own to prevent diseases and keep yourself healthy. What should I know about diet, weight, and exercise? Eat a healthy diet  Eat a diet that includes plenty of vegetables, fruits, low-fat dairy products, and lean protein.  Do not eat a lot of foods that are high in solid fats, added sugars, or sodium.   Maintain a healthy weight Body mass index (BMI) is a measurement that  can be used to identify possible weight problems. It estimates body fat based on height and weight. Your health care provider can help determine your BMI and help you achieve or maintain a healthy weight. Get regular exercise Get regular exercise. This is one of the most important things you can do for your health. Most adults should:  Exercise for at least 150 minutes each week. The exercise should increase your heart rate and make you sweat (moderate-intensity exercise).  Do strengthening exercises at least twice a week. This is in addition to the moderate-intensity exercise.  Spend less time sitting. Even light physical activity can be beneficial. Watch cholesterol and blood lipids Have your blood tested for lipids and cholesterol at 75 years of age, then have this test every 5 years. You may need to have your cholesterol levels checked more often if:  Your lipid or cholesterol levels are high.  You are older than 75 years of age.  You are at high risk for heart disease. What should I know about cancer screening? Many types of cancers can be detected early and may often be prevented. Depending on your health history and family history, you may need to have cancer screening at various ages. This may include screening for:  Colorectal cancer.  Prostate cancer.  Skin cancer.  Lung cancer. What should I know about heart disease, diabetes, and high blood pressure? Blood pressure and heart disease  High blood pressure causes heart disease and increases the risk of stroke. This is more likely to develop in people who have high blood pressure readings, are of African descent, or are overweight.  Talk with your health care provider about your target blood pressure readings.  Have your blood pressure checked: ?  Every 3-5 years if you are 36-77 years of age. ? Every year if you are 74 years old or older.  If you are between the ages of 35 and 52 and are a current or former smoker, ask  your health care provider if you should have a one-time screening for abdominal aortic aneurysm (AAA). Diabetes Have regular diabetes screenings. This checks your fasting blood sugar level. Have the screening done:  Once every three years after age 72 if you are at a normal weight and have a low risk for diabetes.  More often and at a younger age if you are overweight or have a high risk for diabetes. What should I know about preventing infection? Hepatitis B If you have a higher risk for hepatitis B, you should be screened for this virus. Talk with your health care provider to find out if you are at risk for hepatitis B infection. Hepatitis C Blood testing is recommended for:  Everyone born from [birth year] through 1965.  Anyone with known risk factors for hepatitis C. Sexually transmitted infections (STIs)  You should be screened each year for STIs, including gonorrhea and chlamydia, if: ? You are sexually active and are younger than 75 years of age. ? You are older than 75 years of age and your health care provider tells you that you are at risk for this type of infection. ? Your sexual activity has changed since you were last screened, and you are at increased risk for chlamydia or gonorrhea. Ask your health care provider if you are at risk.  Ask your health care provider about whether you are at high risk for HIV. Your health care provider may recommend a prescription medicine to help prevent HIV infection. If you choose to take medicine to prevent HIV, you should first get tested for HIV. You should then be tested every 3 months for as long as you are taking the medicine. Follow these instructions at home: Lifestyle  Do not use any products that contain nicotine or tobacco, such as cigarettes, e-cigarettes, and chewing tobacco. If you need help quitting, ask your health care provider.  Do not use street drugs.  Do not share needles.  Ask your health care provider for help if you need  support or information about quitting drugs. Alcohol use  Do not drink alcohol if your health care provider tells you not to drink.  If you drink alcohol: ? Limit how much you have to 0-2 drinks a day. ? Be aware of how much alcohol is in your drink. In the [place] one drink equals one 12 oz bottle of beer (355 mL), one 5 oz glass of wine (148 mL), or one 1 oz glass of hard liquor (44 mL). General instructions  Schedule regular health, dental, and eye exams.  Stay current with your vaccines.  Tell your health care provider if: ? You often feel depressed. ? You have ever been abused or do not feel safe at home. Summary  Adopting a healthy lifestyle and getting preventive care are important in promoting health and wellness.  Follow your health care provider's instructions about healthy diet, exercising, and getting tested or screened for diseases.  Follow your health care provider's instructions on monitoring your cholesterol and blood pressure. This information is not intended to replace advice given to you by your health care provider. Make sure you discuss any questions you have with your health care provider. Document Revised: 06/25/2018 Document Reviewed: 06/25/2018 Elsevier Patient Education  2021 Else[employer]  Inc[employer]    MEDICARE ANNUAL WELLNESS VISIT Health Maintenance Summary and Written Plan of Care  Zachary Lyons ,  Thank you for allowing me to perform your Medicare Annual Wellness Visit and for your ongoing commitment to your health.   Health Maintenance & Immunization History Health Maintenance  Topic Date Due  . OPHTHALMOLOGY EXAM  09/07/2020 (Originally 12/15/1955)  . COVID-19 Vaccine (1) 09/22/2020 (Originally 12/15/1950)  . INFLUENZA VACCINE  10/13/2020 (Originally 02/14/2020)  . COLONOSCOPY (Pts 45-95yrs Insurance coverage will need to be confirmed)  06/06/2021 (Originally 12/15[birth year]1990)  . TETANUS/TDAP  06/06/2021 (Originally 12/14/1964)  . PNA vac Low Risk Adult (1 of  2 - PCV13) 06/06/2021 (Originally 12/15/2010)  . HEMOGLOBIN A1C  12/04/2020  . FOOT EXAM  06/06/2021  . Hepatitis C Screening  Completed    There is no immunization history on file for this patient.  These are the patient goals that we discussed: Goals Addressed   None      This is a list of Health Maintenance Items that are overdue or due now: Pneumococcal vaccine  Influenza vaccine Td vaccine Colorectal cancer screening  Eye exam  Orders/Referrals Placed Today: No orders of the defined types were placed in this encounter.  Follow-up Plan . Follow-up with Sunnie Nielsen, DO as planned . Schedule your next medicare wellness exam in one year.

## 2020-09-07 NOTE — Progress Notes (Signed)
MEDICARE ANNUAL WELLNESS VISIT  09/07/2020  Subjective:  Zachary Lyons is a 75 y.o. male patient of Sunnie Nielsen, DO who had a Medicare Annual Wellness Visit today. Zachary Lyons is Retired and lives with their family. he has 10 children. he reports that he is socially active and does interact with friends/family regularly. he is minimally physically active and enjoys doing yard work.  Patient Care Team: Sunnie Nielsen, DO as PCP - General (Osteopathic Medicine)  Advanced Directives 09/07/2020 03/28/2018 03/24/2018 03/19/2018 04/28/2013 04/22/2013  Does Patient Have a Medical Advance Directive? No No No No Patient does not have advance directive;Patient would not like information Patient does not have advance directive  Would patient like information on creating a medical advance directive? No - Patient declined No - Patient declined No - Patient declined No - Patient declined - -  Pre-existing out of facility DNR order (yellow form or pink MOST form) - - - - No No    Hospital Utilization Over the Past 12 Months: # of hospitalizations or ER visits: 0 # of surgeries: 0  Review of Systems    Patient reports that his overall health is unchanged when compared to last year.  Review of Systems: History obtained from chart review and the patient  All other systems negative.  Pain Assessment Pain : 0-10 Pain Score: 5  Pain Type: Acute pain Pain Location: Foot Pain Orientation: Right,Left Pain Descriptors / Indicators: Aching Pain Onset: 1 to 4 weeks ago Pain Frequency: Intermittent Pain Relieving Factors: rest Effect of Pain on Daily Activities: yes; saw NP yesterday for the pain.  Pain Relieving Factors: rest  Current Medications & Allergies (verified) Allergies as of 09/07/2020   No Known Allergies     Medication List       Accurate as of September 07, 2020  2:24 PM. If you have any questions, ask your nurse or doctor.        acetaminophen-codeine 300-30 MG  tablet Commonly known as: TYLENOL #3   AMBULATORY NON FORMULARY MEDICATION Blood pressure monitor.   amLODipine-valsartan 10-160 MG tablet Commonly known as: EXFORGE Take 1 tablet by mouth daily.   amLODipine-valsartan 10-160 MG tablet Commonly known as: EXFORGE Take 1 tablet by mouth daily.   amoxicillin 500 MG capsule Commonly known as: AMOXIL   clotrimazole-betamethasone cream Commonly known as: LOTRISONE Apply 1 application topically 2 (two) times daily.   diclofenac sodium 1 % Gel Commonly known as: VOLTAREN APPLY 4 GRAMS TOPICALLY 4 TIMES DAILY TO AFFECTED JOINT.   diclofenac Sodium 1 % Gel Commonly known as: VOLTAREN Apply 2 g topically 4 (four) times daily. To affected joint.   famotidine 40 MG tablet Commonly known as: PEPCID Take 1 tablet (40 mg total) by mouth daily.   meloxicam 7.5 MG tablet Commonly known as: MOBIC Take 1 tablet (7.5 mg total) by mouth daily as needed for pain.   metFORMIN 500 MG 24 hr tablet Commonly known as: GLUCOPHAGE-XR TAKE 1 TABLET EVERY DAY WITH BREAKFAST   metoprolol succinate 50 MG 24 hr tablet Commonly known as: TOPROL-XL Take 3 tablets (150 mg total) by mouth daily. Take with or immediately after food.   omeprazole 40 MG capsule Commonly known as: PRILOSEC Take 1 capsule (40 mg total) by mouth daily.   oxybutynin 5 MG 24 hr tablet Commonly known as: DITROPAN-XL Take 1 tablet (5 mg total) by mouth at bedtime.   predniSONE 20 MG tablet Commonly known as: DELTASONE Take 1 tablet (20 mg total) by mouth daily with  breakfast. For rash.   simvastatin 10 MG tablet Commonly known as: ZOCOR Take 1 tablet (10 mg total) by mouth at bedtime.       History (reviewed): Past Medical History:  Diagnosis Date  . Arthritis    osteoarthritis  . GERD (gastroesophageal reflux disease)   . Hypertension   . Nasal polyps    removed  . Peptic ulcer    hx. of  . Pre-diabetes   . Transfusion history 04-22-13   15 yrs  ago-"bleeding ulcer"   Past Surgical History:  Procedure Laterality Date  . CATARACT EXTRACTION Left 04-22-13  . COLONOSCOPY    . JOINT REPLACEMENT     Right total knee arthroplasty  . NASAL SINUS SURGERY     polyps revoved 08-2017  . STOMACH SURGERY     oversew of bleeding ulcer  . TOTAL KNEE ARTHROPLASTY Left 04/27/2013   Procedure: LEFT TOTAL KNEE ARTHROPLASTY;  Surgeon: Loanne Drilling, MD;  Location: [facility];  Service: Orthopedics;  Laterality: Left;  . TOTAL KNEE ARTHROPLASTY Right 03/24/2018   Procedure: RIGHT TOTAL KNEE ARTHROPLASTY;  Surgeon: Ollen Gross, MD;  Location: [facility];  Service: Orthopedics;  Laterality: Right;   History reviewed. No pertinent family history. Social History   Socioeconomic History  . Marital status: Married    Spouse name: Zachary Lyons  . Number of children: 10  . Years of education: 4th grade  . Highest education level: 4th grade  Occupational History  . Occupation: Retired  Tobacco Use  . Smoking status: Former Smoker    Quit date: 04/23/1979    Years since quitting: 41.4  . Smokeless tobacco: Never Used  Vaping Use  . Vaping Use: Never used  Substance and Sexual Activity  . Alcohol use: Yes    Alcohol/week: 6.0 standard drinks    Types: 6 Cans of beer per week    Comment: occasional  . Drug use: No  . Sexual activity: Yes    Partners: Female  Other Topics Concern  . Not on file  Social History Narrative   Lives with his wife and 2 children and 2 grand children. He likes to do yard work in his free time. He drink 3-6 beers a week.   Social Determinants of Health   Financial Resource Strain: Low Risk   . Difficulty of Paying Living Expenses: Not hard at all  Food Insecurity: No Food Insecurity  . Worried About Programme researcher, broadcasting/film/video in the Last Year: Never true  . Ran Out of Food in the Last Year: Never true  Transportation Needs: No Transportation Needs  . Lack of Transportation (Medical): No  . Lack of Transportation (Non-Medical): No   Physical Activity: Inactive  . Days of Exercise per Week: 0 days  . Minutes of Exercise per Session: 0 min  Stress: No Stress Concern Present  . Feeling of Stress : Not at all  Social Connections: Moderately Integrated  . Frequency of Communication with Friends and Family: More than three times a week  . Frequency of Social Gatherings with Friends and Family: More than three times a week  . Attends Religious Services: More than 4 times per year  . Active Member of Clubs or Organizations: No  . Attends Banker Meetings: Never  . Marital Status: Married    Activities of Daily Living In your present state of health, do you have any difficulty performing the following activities: 09/07/2020  Hearing? N  Vision? Y  Comment last eye exam march  2020. has three glasses but feels that he can't see well with them.  Difficulty concentrating or making decisions? N  Walking or climbing stairs? N  Dressing or bathing? N  Doing errands, shopping? Y  Comment Usually goes with his wife; interpreting and remembering the instructions from the doctor.  Preparing Food and eating ? N  Using the Toilet? N  In the past six months, have you accidently leaked urine? N  Do you have problems with loss of bowel control? N  Managing your Medications? N  Managing your Finances? Y  Comment his wife does the finances for him.  Housekeeping or managing your Housekeeping? N  Some recent data might be hidden    Patient Education/Literacy How often do you need to have someone help you when you read instructions, pamphlets, or other written materials from your doctor or pharmacy?: 5 - Always What is the last grade level you completed in school?: 4th grade  Exercise Current Exercise Habits: The patient does not participate in regular exercise at present, Exercise limited by: None identified  Diet Patient reports consuming 2 meals a day and 1 snack(s) a day Patient reports that his primary diet is:  Regular Patient reports that she does have regular access to food.   Depression Screen PHQ 2/9 Scores 09/07/2020 08/27/2018 09/16/2017 04/10/2017  PHQ - 2 Score 0 0 0 0  PHQ- 9 Score 0 - 0 -     Fall Risk Fall Risk  09/07/2020 04/10/2017 04/10/2017  Falls in the past year? 0 No No  Number falls in past yr: 0 - -  Injury with Fall? 0 - -  Risk for fall due to : No Fall Risks - -  Follow up Falls evaluation completed - -     Objective:   BP 122/67 (BP Location: Left Arm, Patient Position: Sitting, Cuff Size: Normal)   Pulse (!) 59   Temp 98.2 F (36.8 C) (Oral)   Resp 16   Wt 153 lb (69.4 kg)   SpO2 100%   BMI 27.10 kg/m   Last Weight  Most recent update: 09/07/2020  1:52 PM   Weight  69.4 kg (153 lb)            Body mass index is 27.1 kg/m.  Hearing/Vision  . Zachary Lyons did not have difficulty with hearing/understanding during the face-to-face interview . Zachary Lyons did not have difficulty with his vision during the face-to-face interview . Reports that he has not had a formal eye exam by an eye care professional within the past year . Reports that he has not had a formal hearing evaluation within the past year  Cognitive Function: 6CIT Screen 09/07/2020  What Year? 4 points  What month? 0 points  What time? 0 points  Count back from 20 0 points  Months in reverse 4 points  Repeat phrase 10 points  Total Score 18    Normal Cognitive Function Screening: Yes (Normal:0-7, Significant for Dysfunction: >8)  Immunization & Health Maintenance Record  There is no immunization history on file for this patient.  Health Maintenance  Topic Date Due  . OPHTHALMOLOGY EXAM  09/07/2020 (Originally 12/15/1955)  . COVID-19 Vaccine (1) 09/22/2020 (Originally 12/15/1950)  . INFLUENZA VACCINE  10/13/2020 (Originally 02/14/2020)  . COLONOSCOPY (Pts 45-70yrs Insurance coverage will need to be confirmed)  06/06/2021 (Originally 12/15/1990)  . TETANUS/TDAP  06/06/2021 (Originally 12/14/1964)  . PNA vac  Low Risk Adult (1 of 2 - PCV13) 06/06/2021 (Originally 12/15/2010)  . HEMOGLOBIN A1C  12/04/2020  . FOOT EXAM  06/06/2021  . Hepatitis C Screening  Completed       Assessment  This is a routine wellness examination for Zachary Lyons.  Health Maintenance: Due or Overdue There are no preventive care reminders to display for this patient.  Danarius Lyons does not need a referral for Community Assistance: Care Management:   no Social Work:    no Prescription Assistance:  no Nutrition/Diabetes Education:  no   Plan:  Personalized Goals Goals Addressed   None    Personalized Health Maintenance & Screening Recommendations  Pneumococcal vaccine  Influenza vaccine Td vaccine Colorectal cancer screening  Eye exam  Lung Cancer Screening Recommended: no (Low Dose CT Chest recommended if Age 56-80 years, 30 pack-year currently smoking OR have quit w/in past 15 years) Hepatitis C Screening recommended: no HIV Screening recommended: no  Advanced Directives: Written information was not given per the patient's request.  Referrals & Orders No orders of the defined types were placed in this encounter.   Follow-up Plan . Follow-up with Sunnie NielsenAlexander, Natalie, DO as planned . Schedule your next medicare wellness exam in one year.   I have personally reviewed and noted the following in the patient's chart:   . Medical and social history . Use of alcohol, tobacco or illicit drugs  . Current medications and supplements . Functional ability and status . Nutritional status . Physical activity . Advanced directives . List of other physicians . Hospitalizations, surgeries, and ER visits in previous 12 months . Vitals . Screenings to include cognitive, depression, and falls . Referrals and appointments  In addition, I have reviewed and discussed with patient certain preventive protocols, quality metrics, and best practice recommendations. A written personalized care plan for  preventive services as well as general preventive health recommendations were provided to patient.     [place]esto CharonBableen  Tarez Bowns, RN  09/07/2020

## 2020-09-09 ENCOUNTER — Ambulatory Visit: Payer: Medicare HMO

## 2020-09-30 ENCOUNTER — Encounter: Payer: Self-pay | Admitting: Osteopathic Medicine

## 2020-10-03 ENCOUNTER — Encounter: Payer: Self-pay | Admitting: Osteopathic Medicine

## 2021-01-02 ENCOUNTER — Other Ambulatory Visit: Payer: Self-pay | Admitting: Nurse Practitioner

## 2021-01-02 ENCOUNTER — Encounter: Payer: Self-pay | Admitting: Osteopathic Medicine

## 2021-01-02 DIAGNOSIS — K219 Gastro-esophageal reflux disease without esophagitis: Secondary | ICD-10-CM

## 2021-01-02 NOTE — Telephone Encounter (Signed)
Patient has been scheduled with Dr. Mervyn Skeeters for next Monday, 01/09/21. AM

## 2021-01-09 ENCOUNTER — Encounter: Payer: Self-pay | Admitting: Physician Assistant

## 2021-01-09 ENCOUNTER — Ambulatory Visit (INDEPENDENT_AMBULATORY_CARE_PROVIDER_SITE_OTHER): Payer: Medicare HMO | Admitting: Physician Assistant

## 2021-01-09 ENCOUNTER — Ambulatory Visit: Payer: Medicare HMO | Admitting: Osteopathic Medicine

## 2021-01-09 ENCOUNTER — Other Ambulatory Visit: Payer: Self-pay

## 2021-01-09 VITALS — BP 131/66 | HR 56 | Ht 63.0 in | Wt 144.0 lb

## 2021-01-09 DIAGNOSIS — I872 Venous insufficiency (chronic) (peripheral): Secondary | ICD-10-CM | POA: Diagnosis not present

## 2021-01-09 DIAGNOSIS — K219 Gastro-esophageal reflux disease without esophagitis: Secondary | ICD-10-CM | POA: Diagnosis not present

## 2021-01-09 DIAGNOSIS — Z8711 Personal history of peptic ulcer disease: Secondary | ICD-10-CM | POA: Diagnosis not present

## 2021-01-09 MED ORDER — OMEPRAZOLE 40 MG PO CPDR: 40.0000 mg | DELAYED_RELEASE_CAPSULE | Freq: Every day | ORAL | 1 refills | Status: DC

## 2021-01-09 NOTE — Progress Notes (Signed)
Established Patient Office Visit  Subjective:  Patient ID: Zachary Lyons, male    DOB: Mar 21, 1946  Age: 75 y.o. MRN: [id number]  CC: No chief complaint on file.   HPI Zachary Lyons presents for refill of Omeprazole  Patient is accompanied by wife, Jacalyn Lefevre, who assists in providing history  Patient is on long-term PPI and H2blocker for GERD Patient has a remote hx of bleeding ulcers - does not follow with GI History of normal EGD years ago per wife (no records) Takes Meloxicam 7.5 mg prn back pain- reports he has not taken medication in weeks  Wife states if he does not take Omeprazole he will complain of burning epigastric pain and he won't eat  Patient denies dysphagia, odynophagia, regurgitation, vomiting, change in bowel habits, hematochezia/melena  Patient also has chronic venous insufficiency and wears compression stockings. Notices swelling is worse after prolonged standing/activity and slightly worse in the right leg. Stockings help. No prior hx of VTE. Wife would like to know if she can give him a potassium supplement for this.  Past Medical History:  Diagnosis Date   Arthritis    osteoarthritis   GERD (gastroesophageal reflux disease)    Hypertension    Nasal polyps    removed   Peptic ulcer    hx. of   Pre-diabetes    Transfusion history 04-22-13   15 yrs ago-"bleeding ulcer"    Past Surgical History:  Procedure Laterality Date   CATARACT EXTRACTION Left 04-22-13   COLONOSCOPY     JOINT REPLACEMENT     Right total knee arthroplasty   NASAL SINUS SURGERY     polyps revoved 08-2017   STOMACH SURGERY     oversew of bleeding ulcer   TOTAL KNEE ARTHROPLASTY Left 04/27/2013   Procedure: LEFT TOTAL KNEE ARTHROPLASTY;  Surgeon: Loanne Drilling, MD;  Location: [facility];  Service: Orthopedics;  Laterality: Left;   TOTAL KNEE ARTHROPLASTY Right 03/24/2018   Procedure: RIGHT TOTAL KNEE ARTHROPLASTY;  Surgeon: Ollen Gross, MD;  Location: [facility];   Service: Orthopedics;  Laterality: Right;    History reviewed. No pertinent family history.  Social History   Socioeconomic History   Marital status: Married    Spouse name: Mary   Number of children: 10   Years of education: 4th grade   Highest education level: 4th grade  Occupational History   Occupation: Retired  Tobacco Use   Smoking status: Former    Pack years: 0.00    Types: Cigarettes    Quit date: 04/23/1979    Years since quitting: 41.7   Smokeless tobacco: Never  Vaping Use   Vaping Use: Never used  Substance and Sexual Activity   Alcohol use: Yes    Alcohol/week: 6.0 standard drinks    Types: 6 Cans of beer per week    Comment: occasional   Drug use: No   Sexual activity: Yes    Partners: Female  Other Topics Concern   Not on file  Social History Narrative   Lives with his wife and 2 children and 2 grand children. He likes to do yard work in his free time. He drink 3-6 beers a week.   Social Determinants of Health   Financial Resource Strain: Low Risk    Difficulty of Paying Living Expenses: Not hard at all  Food Insecurity: No Food Insecurity   Worried About Programme researcher, broadcasting/film/video in the Last Year: Never true   Ran Out of Food in the Last Year:  Never true  Transportation Needs: No Transportation Needs   Lack of Transportation (Medical): No   Lack of Transportation (Non-Medical): No  Physical Activity: Inactive   Days of Exercise per Week: 0 days   Minutes of Exercise per Session: 0 min  Stress: No Stress Concern Present   Feeling of Stress : Not at all  Social Connections: Moderately Integrated   Frequency of Communication with Friends and Family: More than three times a week   Frequency of Social Gatherings with Friends and Family: More than three times a week   Attends Religious Services: More than 4 times per year   Active Member of [employer] or Organizations: No   Attends Banker Meetings: Never   Marital Status: Married  Careers information officer Violence: Not At Risk   Fear of Current or Ex-Partner: No   Emotionally Abused: No   Physically Abused: No   Sexually Abused: No    Outpatient Medications Prior to Visit  Medication Sig Dispense Refill   AMBULATORY NON FORMULARY MEDICATION Blood pressure monitor. 1 each 0   amLODipine-valsartan (EXFORGE) 10-160 MG tablet Take 1 tablet by mouth daily. 90 tablet 3   amoxicillin (AMOXIL) 500 MG capsule      clotrimazole-betamethasone (LOTRISONE) cream Apply 1 application topically 2 (two) times daily. 80 g 3   diclofenac Sodium (VOLTAREN) 1 % GEL Apply 2 g topically 4 (four) times daily. To affected joint. 100 g 11   famotidine (PEPCID) 40 MG tablet Take 1 tablet (40 mg total) by mouth daily. 90 tablet 2   meloxicam (MOBIC) 7.5 MG tablet Take 1 tablet (7.5 mg total) by mouth daily as needed for pain. 90 tablet 1   metFORMIN (GLUCOPHAGE-XR) 500 MG 24 hr tablet TAKE 1 TABLET EVERY DAY WITH BREAKFAST 90 tablet 2   metoprolol succinate (TOPROL-XL) 50 MG 24 hr tablet Take 3 tablets (150 mg total) by mouth daily. Take with or immediately after food. 270 tablet 2   oxybutynin (DITROPAN-XL) 5 MG 24 hr tablet Take 1 tablet (5 mg total) by mouth at bedtime. 90 tablet 3   simvastatin (ZOCOR) 10 MG tablet Take 1 tablet (10 mg total) by mouth at bedtime. 90 tablet 2   acetaminophen-codeine (TYLENOL #3) 300-30 MG tablet      amLODipine-valsartan (EXFORGE) 10-160 MG tablet Take 1 tablet by mouth daily. 30 tablet 0   diclofenac sodium (VOLTAREN) 1 % GEL APPLY 4 GRAMS TOPICALLY 4 TIMES DAILY TO AFFECTED JOINT. 200 g 0   omeprazole (PRILOSEC) 40 MG capsule Take 1 capsule (40 mg total) by mouth daily. 90 capsule 2   predniSONE (DELTASONE) 20 MG tablet Take 1 tablet (20 mg total) by mouth daily with breakfast. For rash. (Patient not taking: Reported on 09/07/2020) 5 tablet 0   No facility-administered medications prior to visit.    No Known Allergies  ROS Review of Systems  Constitutional:  Negative  for appetite change and unexpected weight change.  Cardiovascular:  Positive for leg swelling (chronic).  Gastrointestinal:        GERD   All other systems reviewed and are negative.    Objective:    Physical Exam Vitals reviewed.  Constitutional:      Appearance: Normal appearance. He is not ill-appearing or toxic-appearing.  Neck:     Thyroid: No thyromegaly.     Trachea: Trachea and phonation normal.     Comments: Normal swallow Cardiovascular:     Rate and Rhythm: Normal rate and regular rhythm.  Heart sounds: Normal heart sounds.     Comments: Pitting in the posterior tibialis area b/l Abdominal:     General: Abdomen is flat. Bowel sounds are normal.     Palpations: Abdomen is soft.     Tenderness: There is no abdominal tenderness. Negative signs include Murphy's sign.  Musculoskeletal:     Cervical back: Neck supple. No tenderness.     Right lower leg: 1+ Pitting Edema present.     Left lower leg: 1+ Pitting Edema present.  Lymphadenopathy:     Cervical: No cervical adenopathy.  Skin:    General: Skin is warm and dry.  Neurological:     Mental Status: He is alert.    BP 131/66   Pulse (!) 56   Ht 5\' 3"  (1.6 m)   Wt 144 lb (65.3 kg)   BMI 25.51 kg/m  Wt Readings from Last 3 Encounters:  01/09/21 144 lb (65.3 kg)  09/07/20 153 lb (69.4 kg)  09/06/20 153 lb 14.4 oz (69.8 kg)     Health Maintenance Due  Topic Date Due   COVID-19 Vaccine (1) Never done   OPHTHALMOLOGY EXAM  Never done   Zoster Vaccines- Shingrix (1 of 2) Never done   HEMOGLOBIN A1C  12/04/2020    There are no preventive care reminders to display for this patient.  Lab Results  Component Value Date   TSH 1.14 01/31/2018   Lab Results  Component Value Date   WBC 5.5 06/06/2020   HGB 15.1 06/06/2020   HCT 43.3 06/06/2020   MCV 93.3 06/06/2020   PLT 195 06/06/2020   Lab Results  Component Value Date   NA 138 09/06/2020   K 4.1 09/06/2020   CO2 31 09/06/2020   GLUCOSE 110  09/06/2020   BUN 9 09/06/2020   CREATININE 1.00 09/06/2020   BILITOT 0.5 09/06/2020   ALKPHOS 108 03/19/2018   AST 20 09/06/2020   ALT 11 09/06/2020   PROT 6.9 09/06/2020   ALBUMIN 4.1 03/19/2018   CALCIUM 9.0 09/06/2020   ANIONGAP 9 03/26/2018   Lab Results  Component Value Date   CHOL 193 06/06/2020   Lab Results  Component Value Date   HDL 99 06/06/2020   Lab Results  Component Value Date   LDLCALC 80 06/06/2020   Lab Results  Component Value Date   TRIG 62 06/06/2020   Lab Results  Component Value Date   CHOLHDL 1.9 06/06/2020   Lab Results  Component Value Date   HGBA1C 5.6 06/06/2020      Assessment & Plan:   Problem List Items Addressed This Visit       Cardiovascular and Mediastinum   Venous insufficiency (chronic) (peripheral)     Digestive   Gastroesophageal reflux disease - Primary   Relevant Medications   omeprazole (PRILOSEC) 40 MG capsule     Other   History of bleeding peptic ulcer    Meds ordered this encounter  Medications   omeprazole (PRILOSEC) 40 MG capsule    Sig: Take 1 capsule (40 mg total) by mouth daily.    Dispense:  90 capsule    Refill:  1   Personally reviewed renal function Refilled Omeprazole 40 mg - patient also taking Famotidine 40 mg with good control of GERD symptoms No red flag symptoms or indication for EGD Continue to limit NSAIDs Cont GERD eating plan  Counseled patient and wife that I do not recommend potassium supplementation. He has no hx of hypokalemia and there is  no evidence that oral potassium helps chronic venous insufficiency. Cont compression stockings 8-12 hrs daily and elevation. Consider diuretic prn if worsening. Amlodipine is also likely contributory  Follow-up: No follow-ups on file.    Carlis Stable, [place]

## 2021-01-09 NOTE — Patient Instructions (Signed)
I do not recommend potassium supplement Continue wearing compression stockings 8-12 hrs daily and elevate legs when at rest  Chronic Venous Insufficiency Chronic venous insufficiency is a condition where the leg veins cannot effectively pump blood from the legs to the heart. This happens when the vein walls are either stretched, weakened, or damaged, or when the valves inside the vein are damaged. With the right treatment, you should be able to continue withan active life. This condition is also called venous stasis. What are the causes? Common causes of this condition include: High blood pressure inside the veins (venous hypertension). Sitting or standing too long, causing increased blood pressure in the leg veins. A blood clot that blocks blood flow in a vein (deep vein thrombosis, DVT). Inflammation of a vein (phlebitis) that causes a blood clot to form. Tumors in the pelvis that cause blood to back up. What increases the risk? The following factors may make you more likely to develop this condition: Having a family history of this condition. Obesity. Pregnancy. Living without enough regular physical activity or exercise (sedentary lifestyle). Smoking. Having a job that requires long periods of standing or sitting in one place. Being a certain age. Women in their 33s and 14s and men in their 6s are more likely to develop this condition. What are the signs or symptoms? Symptoms of this condition include: Veins that are enlarged, bulging, or twisted (varicose veins). Skin breakdown or ulcers. Reddened skin or dark discoloration of skin on the leg between the knee and ankle. Brown, smooth, tight, and painful skin just above the ankle, usually on the inside of the leg (lipodermatosclerosis). Swelling of the legs. How is this diagnosed? This condition may be diagnosed based on: Your medical history. A physical exam. Tests, such as: A procedure that creates an image of a blood vessel  and nearby organs and provides information about blood flow through the blood vessel (duplex ultrasound). A procedure that tests blood flow (plethysmography). A procedure that looks at the veins using X-ray and dye (venogram). How is this treated? The goals of treatment are to help you return to an active life and to minimize pain or disability. Treatment depends on the severity of your condition, and it may include: Wearing compression stockings. These can help relieve symptoms and help prevent your condition from getting worse. However, they do not cure the condition. Sclerotherapy. This procedure involves an injection of a solution that shrinks damaged veins. Surgery. This may involve: Removing a diseased vein (vein stripping). Cutting off blood flow through the vein (laser ablation surgery). Repairing or reconstructing a valve within the affected vein. Follow these instructions at home:     Wear compression stockings as told by your health care provider. These stockings help to prevent blood clots and reduce swelling in your legs. Take over-the-counter and prescription medicines only as told by your health care provider. Stay active by exercising, walking, or doing different activities. Ask your health care provider what activities are safe for you and how much exercise you need. Drink enough fluid to keep your urine pale yellow. Do not use any products that contain nicotine or tobacco, such as cigarettes, e-cigarettes, and chewing tobacco. If you need help quitting, ask your health care provider. Keep all follow-up visits as told by your health care provider. This is important. Contact a health care provider if you: Have redness, swelling, or more pain in the affected area. See a red streak or line that goes up or down from the  affected area. Have skin breakdown or skin loss in the affected area, even if the breakdown is small. Get an injury in the affected area. Get help right away  if: You get an injury and an open wound in the affected area. You have: Severe pain that does not get better with medicine. Sudden numbness or weakness in the foot or ankle below the affected area. Trouble moving your foot or ankle. A fever. Worse or persistent symptoms. Chest pain. Shortness of breath. Summary Chronic venous insufficiency is a condition where the leg veins cannot effectively pump blood from the legs to the heart. Chronic venous insufficiency occurs when the vein walls become stretched, weakened, or damaged, or when valves within the vein are damaged. Treatment depends on how severe your condition is. It often involves wearing compression stockings and may involve having a procedure. Make sure you stay active by exercising, walking, or doing different activities. Ask your health care provider what activities are safe for you and how much exercise you need. This information is not intended to replace advice given to you by your health care provider. Make sure you discuss any questions you have with your healthcare provider. Document Revised: 03/25/2018 Document Reviewed: 03/25/2018 Elsevier Patient Education  2022 [employer].

## 2021-03-01 ENCOUNTER — Encounter: Payer: Self-pay | Admitting: Osteopathic Medicine

## 2021-03-01 DIAGNOSIS — I10 Essential (primary) hypertension: Secondary | ICD-10-CM

## 2021-03-01 MED ORDER — METOPROLOL SUCCINATE ER 50 MG PO TB24: 150.0000 mg | ORAL_TABLET | Freq: Every day | ORAL | 1 refills | Status: DC

## 2021-03-09 ENCOUNTER — Other Ambulatory Visit: Payer: Self-pay | Admitting: Nurse Practitioner

## 2021-03-09 DIAGNOSIS — K219 Gastro-esophageal reflux disease without esophagitis: Secondary | ICD-10-CM

## 2021-03-09 DIAGNOSIS — E785 Hyperlipidemia, unspecified: Secondary | ICD-10-CM

## 2021-03-23 ENCOUNTER — Telehealth: Payer: Self-pay | Admitting: Lab

## 2021-03-23 NOTE — Progress Notes (Signed)
  Chronic Care Management   Outreach Note  03/23/2021 Name: Zachary Lyons MRN: [id number] DOB: 11/16/1945  Referred by: Sunnie Nielsen, DO Reason for referral : Medication Management   An unsuccessful telephone outreach was attempted today. The patient was referred to the pharmacist for assistance with care management and care coordination.   Follow Up Plan:   Carilyn Goodpasture  Upstream Scheduler

## 2021-04-06 ENCOUNTER — Telehealth: Payer: Self-pay

## 2021-04-06 NOTE — Telephone Encounter (Signed)
Medication: metoprolol succinate (TOPROL-XL) 50 MG 24 hr tablet This is for a quantity limitation Prior authorization determination received via real time Medication has been approved Approval dates: on approval notification fax  Patient aware via: MyChart Pharmacy aware: Yes Provider aware via this encounter

## 2021-04-11 NOTE — Telephone Encounter (Signed)
Medication: metoprolol succinate (TOPROL-XL) 50 MG 24 hr tablet Prior authorization determination received Medication has been approved Approval dates: 04/06/2021-07/15/2021  Patient aware via: MyChart Pharmacy aware: Yes Provider aware via this encounter

## 2021-05-10 ENCOUNTER — Encounter: Payer: Self-pay | Admitting: Medical-Surgical

## 2021-05-10 DIAGNOSIS — K219 Gastro-esophageal reflux disease without esophagitis: Secondary | ICD-10-CM

## 2021-05-11 MED ORDER — OMEPRAZOLE 40 MG PO CPDR: 40.0000 mg | DELAYED_RELEASE_CAPSULE | Freq: Every day | ORAL | 1 refills | Status: DC

## 2021-05-22 ENCOUNTER — Other Ambulatory Visit: Payer: Self-pay

## 2021-05-22 ENCOUNTER — Encounter: Payer: Self-pay | Admitting: Medical-Surgical

## 2021-05-22 ENCOUNTER — Ambulatory Visit (INDEPENDENT_AMBULATORY_CARE_PROVIDER_SITE_OTHER): Payer: Medicare HMO | Admitting: Medical-Surgical

## 2021-05-22 VITALS — BP 122/62 | HR 52 | Temp 98.1°F | Ht 63.0 in | Wt 144.0 lb

## 2021-05-22 DIAGNOSIS — L259 Unspecified contact dermatitis, unspecified cause: Secondary | ICD-10-CM

## 2021-05-22 DIAGNOSIS — R399 Unspecified symptoms and signs involving the genitourinary system: Secondary | ICD-10-CM

## 2021-05-22 DIAGNOSIS — E785 Hyperlipidemia, unspecified: Secondary | ICD-10-CM | POA: Diagnosis not present

## 2021-05-22 DIAGNOSIS — I10 Essential (primary) hypertension: Secondary | ICD-10-CM | POA: Diagnosis not present

## 2021-05-22 DIAGNOSIS — E118 Type 2 diabetes mellitus with unspecified complications: Secondary | ICD-10-CM | POA: Diagnosis not present

## 2021-05-22 DIAGNOSIS — R718 Other abnormality of red blood cells: Secondary | ICD-10-CM | POA: Diagnosis not present

## 2021-05-22 DIAGNOSIS — D7589 Other specified diseases of blood and blood-forming organs: Secondary | ICD-10-CM | POA: Diagnosis not present

## 2021-05-22 DIAGNOSIS — Z1329 Encounter for screening for other suspected endocrine disorder: Secondary | ICD-10-CM | POA: Diagnosis not present

## 2021-05-22 DIAGNOSIS — E782 Mixed hyperlipidemia: Secondary | ICD-10-CM | POA: Diagnosis not present

## 2021-05-22 DIAGNOSIS — K219 Gastro-esophageal reflux disease without esophagitis: Secondary | ICD-10-CM | POA: Diagnosis not present

## 2021-05-22 LAB — POCT GLYCOSYLATED HEMOGLOBIN (HGB A1C): Hemoglobin A1C: 5.3 % (ref 4.0–5.6)

## 2021-05-22 MED ORDER — METFORMIN HCL ER 500 MG PO TB24: ORAL_TABLET | ORAL | 2 refills | Status: DC

## 2021-05-22 MED ORDER — CLOTRIMAZOLE-BETAMETHASONE 1-0.05 % EX CREA: 1.0000 "application " | TOPICAL_CREAM | Freq: Two times a day (BID) | CUTANEOUS | 3 refills | Status: DC

## 2021-05-22 MED ORDER — OMEPRAZOLE 40 MG PO CPDR: 40.0000 mg | DELAYED_RELEASE_CAPSULE | Freq: Every day | ORAL | 1 refills | Status: DC

## 2021-05-22 MED ORDER — AMLODIPINE BESYLATE-VALSARTAN 10-160 MG PO TABS: 1.0000 | ORAL_TABLET | Freq: Every day | ORAL | 3 refills | Status: DC

## 2021-05-22 MED ORDER — SIMVASTATIN 10 MG PO TABS: 10.0000 mg | ORAL_TABLET | Freq: Every day | ORAL | 1 refills | Status: DC

## 2021-05-22 MED ORDER — METOPROLOL SUCCINATE ER 50 MG PO TB24: 150.0000 mg | ORAL_TABLET | Freq: Every day | ORAL | 1 refills | Status: DC

## 2021-05-22 MED ORDER — OXYBUTYNIN CHLORIDE ER 5 MG PO TB24: 5.0000 mg | ORAL_TABLET | Freq: Every day | ORAL | 0 refills | Status: DC

## 2021-05-22 MED ORDER — OXYBUTYNIN CHLORIDE ER 5 MG PO TB24: 5.0000 mg | ORAL_TABLET | Freq: Every day | ORAL | 3 refills | Status: DC

## 2021-05-22 MED ORDER — FAMOTIDINE 40 MG PO TABS
40.0000 mg | ORAL_TABLET | Freq: Every day | ORAL | 1 refills | Status: DC
Start: 2021-05-22 — End: 2021-11-15

## 2021-05-22 NOTE — Progress Notes (Signed)
HPI with pertinent ROS:   CC: Transfer of care, chronic disease follow-up  HPI: Pleasant 75 year old male presenting today to transfer care to a new PCP and follow-up on chronic diseases.  Diabetes- not checking sugars at home. Taking Metformin XR 500mg  daily, tolerating well without side effects.   Hypertension- Checking BP at home with readings in 120s/70s. Taking Exforge and Toprol XL as prescribed, tolerating well. Has some bilateral lower extremity edema that is bothersome. Eating a low sodium diet but not wearing compression socks because he doesn't like them and says they don't help. Denies CP, SOB, palpitations,dizziness, headaches, or vision changes.  Hyperlipidemia- taking Zocor 10mg  daily, tolerating well without side effects.   GERD- taking omeprazole 40mg  daily. Also has famotidine 40mg  daily prn that he takes on occasion.   LUTS- taking oxybutynin 5mg  daily, tolerating well without side effects. Has been out of this for a bit and would like to get restarted so that he doesn't have issues on his trip to .   Wife notes that she started him on several supplements including 1,000mg  of Vitamin C daily, 1 tablet of Super B Complex, and 1 tablet of Liver All-in-One Optimum Cleanse daily. Wants to verify if these are ok to take with his other medications. Feels the liver supplement helped to clear up the rash that was on his back.   I reviewed the past medical history, family history, social history, surgical history, and allergies today and no changes were needed.  Please see the problem list section below in epic for further details.   Physical exam:   General: Well Developed, well nourished, and in no acute distress.  Neuro: Alert and oriented x3. HEENT: Normocephalic, atraumatic. Skin: Warm and dry. Cardiac: Regular rate and rhythm, no murmurs rubs or gallops, no lower extremity edema.  Respiratory: Clear to auscultation bilaterally. Not using accessory muscles,  speaking in full sentences.  Impression and Recommendations:    1. Controlled type 2 diabetes mellitus with complication, without long-term current use of insulin (HCC) Checking labs. POCT hgb A1c 5.3%. Continue metformin.  - COMPLETE METABOLIC PANEL WITH GFR - CBC with Differential/Platelet - POCT glycosylated hemoglobin (Hb A1C) - metFORMIN (GLUCOPHAGE-XR) 500 MG 24 hr tablet; TAKE 1 TABLET EVERY DAY WITH BREAKFAST  Dispense: 90 tablet; Refill: 2  2. Mixed hyperlipidemia Checking lipid panel. Continue Zocor.  - Lipid panel  3. Screening for endocrine disorder Checking TSH.  - TSH  4. Essential hypertension, benign BP well controlled. Checking labs. Continue Exforge and Toprol XL as prescribed.  - Lipid panel - COMPLETE METABOLIC PANEL WITH GFR - amLODipine-valsartan (EXFORGE) 10-160 MG tablet; Take 1 tablet by mouth daily.  Dispense: 90 tablet; Refill: 3 - metoprolol succinate (TOPROL-XL) 50 MG 24 hr tablet; Take 3 tablets (150 mg total) by mouth daily. Take with or immediately after food.  Dispense: 270 tablet; Refill: 1  5. Hyperlipidemia, unspecified hyperlipidemia type Continue Zocor.  - simvastatin (ZOCOR) 10 MG tablet; Take 1 tablet (10 mg total) by mouth at bedtime.  Dispense: 90 tablet; Refill: 1  6. Contact dermatitis, unspecified contact dermatitis type, unspecified trigger Continue Lotrisone to affected area prn.  - clotrimazole-betamethasone (LOTRISONE) cream; Apply 1 application topically 2 (two) times daily.  Dispense: 80 g; Refill: 3  7. Gastroesophageal reflux disease, unspecified whether esophagitis present Continue omeprazole daily and famotidine daily prn.  - famotidine (PEPCID) 40 MG tablet; Take 1 tablet (40 mg total) by mouth daily.  Dispense: 90 tablet; Refill: 1 - omeprazole (PRILOSEC) 40  MG capsule; Take 1 capsule (40 mg total) by mouth daily.  Dispense: 90 capsule; Refill: 1  8. Lower urinary tract symptoms (LUTS) Continue Oxybutynin.  - oxybutynin  (DITROPAN-XL) 5 MG 24 hr tablet; Take 1 tablet (5 mg total) by mouth at bedtime.  Dispense: 90 tablet; Refill: 3  9. Bilateral lower extremity edema Consider venous insufficiency vs side effect from Amlodipine. Recommend compression socks that are well fitted. Recommend evaluating diet for hidden sodium. Stay well hydrated. Elevated lower extremities when sitting.   Return in about 6 months (around 11/19/2021) for chronic disease follow up. ___________________________________________ Thayer Ohm, DNP, APRN, FNP-BC Primary Care and Sports Medicine [facility]

## 2021-05-23 ENCOUNTER — Encounter: Payer: Self-pay | Admitting: Medical-Surgical

## 2021-05-23 DIAGNOSIS — I10 Essential (primary) hypertension: Secondary | ICD-10-CM

## 2021-05-23 LAB — CBC WITH DIFFERENTIAL/PLATELET
Absolute Monocytes: 514 cells/uL (ref 200–950)
Basophils Absolute: 39 cells/uL (ref 0–200)
Basophils Relative: 0.6 %
Eosinophils Absolute: 143 cells/uL (ref 15–500)
Eosinophils Relative: 2.2 %
HCT: 45.4 % (ref 38.5–50.0)
Hemoglobin: 15.1 g/dL (ref 13.2–17.1)
Lymphs Abs: 2067 cells/uL (ref 850–3900)
MCH: 33.4 pg — ABNORMAL HIGH (ref 27.0–33.0)
MCHC: 33.3 g/dL (ref 32.0–36.0)
MCV: 100.4 fL — ABNORMAL HIGH (ref 80.0–100.0)
MPV: 9.9 fL (ref 7.5–12.5)
Monocytes Relative: 7.9 %
Neutro Abs: 3738 cells/uL (ref 1500–7800)
Neutrophils Relative %: 57.5 %
Platelets: 171 10*3/uL (ref 140–400)
RBC: 4.52 10*6/uL (ref 4.20–5.80)
RDW: 12.8 % (ref 11.0–15.0)
Total Lymphocyte: 31.8 %
WBC: 6.5 10*3/uL (ref 3.8–10.8)

## 2021-05-23 LAB — COMPLETE METABOLIC PANEL WITH GFR
AG Ratio: 1.7 (calc) (ref 1.0–2.5)
ALT: 12 U/L (ref 9–46)
AST: 18 U/L (ref 10–35)
Albumin: 4.5 g/dL (ref 3.6–5.1)
Alkaline phosphatase (APISO): 96 U/L (ref 35–144)
BUN: 12 mg/dL (ref 7–25)
CO2: 27 mmol/L (ref 20–32)
Calcium: 9.5 mg/dL (ref 8.6–10.3)
Chloride: 105 mmol/L (ref 98–110)
Creat: 1.15 mg/dL (ref 0.70–1.28)
Globulin: 2.7 g/dL (calc) (ref 1.9–3.7)
Glucose, Bld: 74 mg/dL (ref 65–139)
Potassium: 3.8 mmol/L (ref 3.5–5.3)
Sodium: 141 mmol/L (ref 135–146)
Total Bilirubin: 0.8 mg/dL (ref 0.2–1.2)
Total Protein: 7.2 g/dL (ref 6.1–8.1)
eGFR: 66 mL/min/{1.73_m2} (ref >=60)

## 2021-05-23 LAB — TEST AUTHORIZATION

## 2021-05-23 LAB — TSH: TSH: 2.03 mIU/L (ref 0.40–4.50)

## 2021-05-23 LAB — LIPID PANEL
Cholesterol: 130 mg/dL (ref <200)
HDL: 63 mg/dL (ref >=40)
LDL Cholesterol (Calc): 50 mg/dL (calc)
Non-HDL Cholesterol (Calc): 67 mg/dL (calc) (ref <130)
Total CHOL/HDL Ratio: 2.1 (calc) (ref <5.0)
Triglycerides: 89 mg/dL (ref <150)

## 2021-05-23 LAB — B12 AND FOLATE PANEL
Folate: >24 ng/mL
Vitamin B-12: 670 pg/mL (ref 200–1100)

## 2021-05-23 MED ORDER — METOPROLOL SUCCINATE ER 50 MG PO TB24: 150.0000 mg | ORAL_TABLET | Freq: Every day | ORAL | 0 refills | Status: DC

## 2021-05-26 NOTE — Telephone Encounter (Signed)
Called [facility] on [address].  They did receive RX from 05/23/2021, but insurance would not pay for it since it has already been filled at mail order pharmacy.  Pt will have to pay out of pocket for the RX.  Advised pt's spouse that RX is at [facility] and they will need to pay out of pocket for this RX, which pharmacist said is about $6.50.  Spouse expressed understanding.  Tiajuana Amass, CMA

## 2021-07-17 ENCOUNTER — Other Ambulatory Visit: Payer: Self-pay | Admitting: Nurse Practitioner

## 2021-07-17 DIAGNOSIS — E118 Type 2 diabetes mellitus with unspecified complications: Secondary | ICD-10-CM

## 2021-11-15 ENCOUNTER — Other Ambulatory Visit: Payer: Self-pay | Admitting: Osteopathic Medicine

## 2021-11-15 ENCOUNTER — Other Ambulatory Visit: Payer: Self-pay | Admitting: Medical-Surgical

## 2021-11-15 DIAGNOSIS — I10 Essential (primary) hypertension: Secondary | ICD-10-CM

## 2021-11-15 DIAGNOSIS — K219 Gastro-esophageal reflux disease without esophagitis: Secondary | ICD-10-CM

## 2022-03-05 ENCOUNTER — Telehealth: Payer: Self-pay | Admitting: Medical-Surgical

## 2022-03-05 DIAGNOSIS — K219 Gastro-esophageal reflux disease without esophagitis: Secondary | ICD-10-CM

## 2022-03-05 NOTE — Telephone Encounter (Signed)
Patient scheduled appointment for  9/22 needs refills for Omeprazole 40mg  he is currently out and would like some called in

## 2022-03-05 NOTE — Telephone Encounter (Signed)
[facility] is on file but they are a mail order pharmacy and will only fill 90 day supply of medications. Since I am sending a 30 day supply, they will likely need to have it sent to a local pharmacy (especially since he is out of it and won't want to wait for the mail to deliver it). I just need to know what pharmacy to send to refill to.

## 2022-03-05 NOTE — Telephone Encounter (Signed)
Patient already has pharmacy on record, please have

## 2022-03-05 NOTE — Telephone Encounter (Signed)
Did you have the nurse to call in the refill???

## 2022-03-07 ENCOUNTER — Encounter: Payer: Self-pay | Admitting: General Practice

## 2022-04-03 ENCOUNTER — Ambulatory Visit: Payer: Medicare HMO | Admitting: Medical-Surgical

## 2022-04-06 ENCOUNTER — Ambulatory Visit (INDEPENDENT_AMBULATORY_CARE_PROVIDER_SITE_OTHER): Payer: Medicare HMO | Admitting: Medical-Surgical

## 2022-04-06 ENCOUNTER — Encounter: Payer: Self-pay | Admitting: Medical-Surgical

## 2022-04-06 VITALS — BP 143/71 | HR 52 | Resp 20 | Ht 63.0 in | Wt 144.1 lb

## 2022-04-06 DIAGNOSIS — E785 Hyperlipidemia, unspecified: Secondary | ICD-10-CM | POA: Diagnosis not present

## 2022-04-06 DIAGNOSIS — E559 Vitamin D deficiency, unspecified: Secondary | ICD-10-CM

## 2022-04-06 DIAGNOSIS — E118 Type 2 diabetes mellitus with unspecified complications: Secondary | ICD-10-CM

## 2022-04-06 DIAGNOSIS — R6889 Other general symptoms and signs: Secondary | ICD-10-CM | POA: Diagnosis not present

## 2022-04-06 DIAGNOSIS — E782 Mixed hyperlipidemia: Secondary | ICD-10-CM

## 2022-04-06 DIAGNOSIS — I10 Essential (primary) hypertension: Secondary | ICD-10-CM

## 2022-04-06 DIAGNOSIS — Z1211 Encounter for screening for malignant neoplasm of colon: Secondary | ICD-10-CM

## 2022-04-06 DIAGNOSIS — K219 Gastro-esophageal reflux disease without esophagitis: Secondary | ICD-10-CM

## 2022-04-06 DIAGNOSIS — R399 Unspecified symptoms and signs involving the genitourinary system: Secondary | ICD-10-CM | POA: Diagnosis not present

## 2022-04-06 DIAGNOSIS — Z23 Encounter for immunization: Secondary | ICD-10-CM

## 2022-04-06 DIAGNOSIS — Z13820 Encounter for screening for osteoporosis: Secondary | ICD-10-CM

## 2022-04-06 LAB — POCT GLYCOSYLATED HEMOGLOBIN (HGB A1C): HbA1c, POC (controlled diabetic range): 5.5 % (ref 0.0–7.0)

## 2022-04-06 MED ORDER — METFORMIN HCL ER 500 MG PO TB24: ORAL_TABLET | ORAL | 2 refills | Status: DC

## 2022-04-06 MED ORDER — OXYBUTYNIN CHLORIDE ER 5 MG PO TB24: 5.0000 mg | ORAL_TABLET | Freq: Every day | ORAL | 3 refills | Status: DC

## 2022-04-06 MED ORDER — OMEPRAZOLE 20 MG PO CPDR: 20.0000 mg | DELAYED_RELEASE_CAPSULE | Freq: Every day | ORAL | 1 refills | Status: DC

## 2022-04-06 MED ORDER — SIMVASTATIN 10 MG PO TABS: 10.0000 mg | ORAL_TABLET | Freq: Every day | ORAL | 1 refills | Status: DC

## 2022-04-06 MED ORDER — METOPROLOL SUCCINATE ER 50 MG PO TB24: 150.0000 mg | ORAL_TABLET | Freq: Every day | ORAL | 0 refills | Status: DC

## 2022-04-06 MED ORDER — AMLODIPINE BESYLATE-VALSARTAN 10-160 MG PO TABS: 1.0000 | ORAL_TABLET | Freq: Every day | ORAL | 3 refills | Status: DC

## 2022-04-06 NOTE — Progress Notes (Signed)
Established Patient Office Visit  Subjective   Patient ID: Zachary Lyons, male   DOB: 1945/10/30 Age: 76 y.o. MRN: [id number]   Chief Complaint  Patient presents with   Diabetes    HPI Pleasant 76 year old male accompanied by his wife who serves as interpreter presenting for the following:  Hypertension: Taking amlodipine-valsartan 10-160 mg daily along with Toprol XL 50 mg daily, tolerating both medications well without significant side effects.  Monitoring blood pressures at home and notes that his systolics have been in the 130s.  Usually follows a lower sodium diet however they do use pink Himalayan salt as they feel this is better for him and does not lead to lower extremity edema.  Wife reports that he is mostly sedentary.  Diabetes: Taking metformin 500 mg daily, tolerating well without side effects.  Does not check his sugars at home.  Wife reports concern that she heard metformin as bad side effects that can affect her kidneys as well as memory.  Does note that he has had some memory changes and wants to know if this could be related to metformin.  Bilateral knee pain: That he has had significant knee pain that hinders his ability to walk for more than 30 minutes.  Was taking turmeric curcumin complex 3000 mg daily but has run out of this.  Felt that this helped tremendously pain in general.  He has had bilateral knee replacements for osteoarthritis and has not followed up with an orthopedic provider to evaluate the worsening pain.  Urinary incontinence: Taking oxybutynin 5 mg nightly, tolerating well without side effects.  Wife notes that without this he has significant difficulty with frequent urination and some incontinence.  GERD taking omeprazole 20 mg daily that they purchased over-the-counter.  When his prescription for 40 mg daily ran out, his reflux did return with severe symptoms.  He started the over-the-counter 20 mg dose for Prilosec and notes that his symptoms  have improved and are well managed.  Would like this sent as a prescription due to over-the-counter cost.  Hyperlipidemia: Was previously taking simvastatin 10 mg daily however he has been out of this since July.  Would like to have this refilled today but wanted to note that since he has been off of it for couple of months, his lipid panel may not be optimized.  On a side note, his wife reports that he stays cold all the time.  She feels that this is unnatural that he needs to have some labs checked for vitamin deficiencies, iron deficiency, and potential anemia.  He endorses that he does stay quite cold most of the time and is often wearing multiple layers of clothing just to try to stay warm.  Objective:    Vitals:   04/06/22 1401 04/06/22 1448  BP: (!) 147/71 (!) 143/71  Pulse: (!) 58 (!) 52  Resp: 20 20  Height: 5\' 3"  (1.6 m)   Weight: 144 lb 1.9 oz (65.4 kg)   SpO2: 96% 98%  BMI (Calculated): 25.54     Physical Exam Vitals reviewed.  Constitutional:      General: He is not in acute distress.    Appearance: Normal appearance. He is not ill-appearing.  HENT:     Head: Normocephalic and atraumatic.  Cardiovascular:     Rate and Rhythm: Normal rate and regular rhythm.     Pulses: Normal pulses.     Heart sounds: Normal heart sounds. No murmur heard.    No friction rub. No gallop.  Pulmonary:     Effort: Pulmonary effort is normal. No respiratory distress.     Breath sounds: Normal breath sounds.  Skin:    General: Skin is warm and dry.  Neurological:     Mental Status: He is alert and oriented to person, place, and time.  Psychiatric:        Mood and Affect: Mood normal.        Behavior: Behavior normal.        Thought Content: Thought content normal.        Judgment: Judgment normal.    Results for orders placed or performed in visit on 04/06/22 (from the past 24 hour(s))  POCT HgB A1C     Status: None   Collection Time: 04/06/22  2:45 PM  Result Value Ref Range    Hemoglobin A1C     HbA1c POC (<> result, manual entry)     HbA1c, POC (prediabetic range)     HbA1c, POC (controlled diabetic range) 5.5 0.0 - 7.0 %       The 10-year ASCVD risk score (Arnett DK, et al., 2019) is: 49.4%   Values used to calculate the score:     Age: 61 years     Sex: Male     Is Non-Hispanic African American: No     Diabetic: Yes     Tobacco smoker: No     Systolic Blood Pressure: 143 mmHg     Is BP treated: Yes     HDL Cholesterol: 63 mg/dL     Total Cholesterol: 130 mg/dL   Assessment & Plan:   1. Controlled type 2 diabetes mellitus with complication, without long-term current use of insulin (HCC) POCT hemoglobin A1c 5.5% today.  Unable to urinate for microalbumin testing.  Plan to get this with the next appointment.  Continue metformin 500 mg daily as prescribed.  Referring to ophthalmology for diabetic eye exam and checking CMP today. - POCT HgB A1C - POCT UA - Microalbumin - COMPLETE METABOLIC PANEL WITH GFR - Ambulatory referral to Ophthalmology - metFORMIN (GLUCOPHAGE-XR) 500 MG 24 hr tablet; TAKE 1 TABLET EVERY DAY WITH BREAKFAST  Dispense: 90 tablet; Refill: 2  2. Gastroesophageal reflux disease, unspecified whether esophagitis present Continue omeprazole 20 mg daily.  We will refer to gastroenterology for up-to-date on colonoscopy as well as further evaluation for GERD if necessary. - omeprazole (PRILOSEC) 20 MG capsule; Take 1 capsule (20 mg total) by mouth daily.  Dispense: 90 capsule; Refill: 1 - Ambulatory referral to Gastroenterology  3. Essential hypertension, benign Checking labs as below.  Blood pressure is a little elevated today.  Discussed the recommendation for limiting dietary sodium.  Reviewed that salt is salt no matter where it is from or what color it may be.  Continue Toprol X 150 mg and Exforge 10-160 mg daily.  Monitor blood pressure at home with a goal of 130/80 or less.  If consistently higher, we will need to return for medication  management. - CBC with Differential/Platelet - COMPLETE METABOLIC PANEL WITH GFR - Lipid panel - metoprolol succinate (TOPROL-XL) 50 MG 24 hr tablet; Take 3 tablets (150 mg total) by mouth daily. Take with or immediately after food.  Dispense: 270 tablet; Refill: 0 - amLODipine-valsartan (EXFORGE) 10-160 MG tablet; Take 1 tablet by mouth daily.  Dispense: 90 tablet; Refill: 3  4. Mixed hyperlipidemia Checking labs as below.  Resume simvastatin 10 mg daily. - COMPLETE METABOLIC PANEL WITH GFR - Lipid panel  5. Need for  influenza vaccination Declined today.  6. Hyperlipidemia, unspecified hyperlipidemia type Resume simvastatin as above. - simvastatin (ZOCOR) 10 MG tablet; Take 1 tablet (10 mg total) by mouth at bedtime.  Dispense: 90 tablet; Refill: 1  7. Lower urinary tract symptoms (LUTS) Continue oxybutynin 5 mg nightly. - oxybutynin (DITROPAN-XL) 5 MG 24 hr tablet; Take 1 tablet (5 mg total) by mouth at bedtime.  Dispense: 90 tablet; Refill: 3  8. Vitamin D deficiency Checking vitamin D. - VITAMIN D 25 Hydroxy (Vit-D Deficiency, Fractures)  9. Cold intolerance Checking labs as below. - Vitamin B12 - TSH - Iron, TIBC and Ferritin Panel  10. Colon cancer screening Referring to GI for colonoscopy update. - Ambulatory referral to Gastroenterology  11. Osteoporosis screening DEXA scan ordered. - DG Bone Density; Future  Return in about 6 months (around 10/05/2022) for chronic disease follow up.  ___________________________________________ Thayer Ohm, DNP, APRN, FNP-BC Primary Care and Sports Medicine [facility] [place]

## 2022-04-07 LAB — COMPLETE METABOLIC PANEL WITH GFR
AG Ratio: 1.4 (calc) (ref 1.0–2.5)
ALT: 8 U/L — ABNORMAL LOW (ref 9–46)
AST: 17 U/L (ref 10–35)
Albumin: 4.4 g/dL (ref 3.6–5.1)
Alkaline phosphatase (APISO): 97 U/L (ref 35–144)
BUN: 11 mg/dL (ref 7–25)
CO2: 23 mmol/L (ref 20–32)
Calcium: 9.4 mg/dL (ref 8.6–10.3)
Chloride: 103 mmol/L (ref 98–110)
Creat: 0.94 mg/dL (ref 0.70–1.28)
Globulin: 3.1 g/dL (calc) (ref 1.9–3.7)
Glucose, Bld: 88 mg/dL (ref 65–99)
Potassium: 4 mmol/L (ref 3.5–5.3)
Sodium: 140 mmol/L (ref 135–146)
Total Bilirubin: 0.7 mg/dL (ref 0.2–1.2)
Total Protein: 7.5 g/dL (ref 6.1–8.1)
eGFR: 84 mL/min/{1.73_m2} (ref >=60)

## 2022-04-07 LAB — CBC WITH DIFFERENTIAL/PLATELET
Absolute Monocytes: 442 cells/uL (ref 200–950)
Basophils Absolute: 52 cells/uL (ref 0–200)
Basophils Relative: 1 %
Eosinophils Absolute: 426 cells/uL (ref 15–500)
Eosinophils Relative: 8.2 %
HCT: 44.7 % (ref 38.5–50.0)
Hemoglobin: 15.3 g/dL (ref 13.2–17.1)
Lymphs Abs: 1929 cells/uL (ref 850–3900)
MCH: 33.8 pg — ABNORMAL HIGH (ref 27.0–33.0)
MCHC: 34.2 g/dL (ref 32.0–36.0)
MCV: 98.7 fL (ref 80.0–100.0)
MPV: 9.9 fL (ref 7.5–12.5)
Monocytes Relative: 8.5 %
Neutro Abs: 2350 cells/uL (ref 1500–7800)
Neutrophils Relative %: 45.2 %
Platelets: 176 10*3/uL (ref 140–400)
RBC: 4.53 10*6/uL (ref 4.20–5.80)
RDW: 12.7 % (ref 11.0–15.0)
Total Lymphocyte: 37.1 %
WBC: 5.2 10*3/uL (ref 3.8–10.8)

## 2022-04-07 LAB — LIPID PANEL
Cholesterol: 207 mg/dL — ABNORMAL HIGH (ref <200)
HDL: 63 mg/dL (ref >=40)
LDL Cholesterol (Calc): 127 mg/dL (calc) — ABNORMAL HIGH
Non-HDL Cholesterol (Calc): 144 mg/dL (calc) — ABNORMAL HIGH (ref <130)
Total CHOL/HDL Ratio: 3.3 (calc) (ref <5.0)
Triglycerides: 80 mg/dL (ref <150)

## 2022-04-07 LAB — IRON,TIBC AND FERRITIN PANEL
%SAT: 35 % (calc) (ref 20–48)
Ferritin: 30 ng/mL (ref 24–380)
Iron: 147 ug/dL (ref 50–180)
TIBC: 425 mcg/dL (calc) (ref 250–425)

## 2022-04-07 LAB — TSH: TSH: 1.6 mIU/L (ref 0.40–4.50)

## 2022-04-07 LAB — VITAMIN B12: Vitamin B-12: 504 pg/mL (ref 200–1100)

## 2022-04-07 LAB — VITAMIN D 25 HYDROXY (VIT D DEFICIENCY, FRACTURES): Vit D, 25-Hydroxy: 41 ng/mL (ref 30–100)

## 2022-04-18 ENCOUNTER — Other Ambulatory Visit: Payer: Medicare HMO

## 2022-07-02 ENCOUNTER — Other Ambulatory Visit: Payer: Self-pay | Admitting: Medical-Surgical

## 2022-07-02 DIAGNOSIS — K219 Gastro-esophageal reflux disease without esophagitis: Secondary | ICD-10-CM

## 2022-08-01 DIAGNOSIS — R1013 Epigastric pain: Secondary | ICD-10-CM | POA: Diagnosis not present

## 2022-08-01 DIAGNOSIS — Z1211 Encounter for screening for malignant neoplasm of colon: Secondary | ICD-10-CM | POA: Diagnosis not present

## 2022-08-01 DIAGNOSIS — K219 Gastro-esophageal reflux disease without esophagitis: Secondary | ICD-10-CM | POA: Diagnosis not present

## 2022-08-22 DIAGNOSIS — E119 Type 2 diabetes mellitus without complications: Secondary | ICD-10-CM | POA: Diagnosis not present

## 2022-08-22 DIAGNOSIS — H524 Presbyopia: Secondary | ICD-10-CM | POA: Diagnosis not present

## 2022-08-22 LAB — HM DIABETES EYE EXAM

## 2022-08-27 ENCOUNTER — Encounter: Payer: Self-pay | Admitting: Medical-Surgical

## 2022-09-25 ENCOUNTER — Other Ambulatory Visit: Payer: Self-pay | Admitting: Medical-Surgical

## 2022-09-25 DIAGNOSIS — K219 Gastro-esophageal reflux disease without esophagitis: Secondary | ICD-10-CM

## 2022-09-25 DIAGNOSIS — E785 Hyperlipidemia, unspecified: Secondary | ICD-10-CM

## 2022-09-25 DIAGNOSIS — I10 Essential (primary) hypertension: Secondary | ICD-10-CM

## 2022-09-26 ENCOUNTER — Other Ambulatory Visit: Payer: Self-pay

## 2022-10-15 ENCOUNTER — Other Ambulatory Visit: Payer: Self-pay | Admitting: Pharmacist

## 2022-10-23 DIAGNOSIS — H2511 Age-related nuclear cataract, right eye: Secondary | ICD-10-CM | POA: Diagnosis not present

## 2022-10-23 DIAGNOSIS — E1136 Type 2 diabetes mellitus with diabetic cataract: Secondary | ICD-10-CM | POA: Diagnosis not present

## 2022-10-23 DIAGNOSIS — H31022 Solar retinopathy, left eye: Secondary | ICD-10-CM | POA: Diagnosis not present

## 2022-10-24 ENCOUNTER — Telehealth: Payer: Self-pay | Admitting: Medical-Surgical

## 2022-10-24 NOTE — Telephone Encounter (Signed)
Called patient to schedule Medicare Annual Wellness Visit (AWV). Unable to reach patient.  Last date of AWV: 2022  Please schedule an appointment at any time with [facility].  If any questions, please contact me at [phone].  Thank you ,  Cira Servant Patient Access Advocate II Direct Dial: [phone]

## 2022-10-25 DIAGNOSIS — Z1211 Encounter for screening for malignant neoplasm of colon: Secondary | ICD-10-CM | POA: Diagnosis not present

## 2022-10-25 DIAGNOSIS — K317 Polyp of stomach and duodenum: Secondary | ICD-10-CM | POA: Diagnosis not present

## 2022-10-25 DIAGNOSIS — D131 Benign neoplasm of stomach: Secondary | ICD-10-CM | POA: Diagnosis not present

## 2022-10-25 DIAGNOSIS — E119 Type 2 diabetes mellitus without complications: Secondary | ICD-10-CM | POA: Diagnosis not present

## 2022-10-25 DIAGNOSIS — K219 Gastro-esophageal reflux disease without esophagitis: Secondary | ICD-10-CM | POA: Diagnosis not present

## 2022-10-25 LAB — HM COLONOSCOPY

## 2022-10-26 DIAGNOSIS — Z79899 Other long term (current) drug therapy: Secondary | ICD-10-CM | POA: Diagnosis not present

## 2022-10-26 DIAGNOSIS — I480 Paroxysmal atrial fibrillation: Secondary | ICD-10-CM | POA: Diagnosis not present

## 2022-10-26 DIAGNOSIS — E119 Type 2 diabetes mellitus without complications: Secondary | ICD-10-CM | POA: Diagnosis not present

## 2022-10-26 DIAGNOSIS — R079 Chest pain, unspecified: Secondary | ICD-10-CM | POA: Diagnosis not present

## 2022-10-26 DIAGNOSIS — E7800 Pure hypercholesterolemia, unspecified: Secondary | ICD-10-CM | POA: Diagnosis not present

## 2022-10-26 DIAGNOSIS — I10 Essential (primary) hypertension: Secondary | ICD-10-CM | POA: Diagnosis not present

## 2022-10-26 DIAGNOSIS — K219 Gastro-esophageal reflux disease without esophagitis: Secondary | ICD-10-CM | POA: Diagnosis not present

## 2022-11-12 DIAGNOSIS — I83813 Varicose veins of bilateral lower extremities with pain: Secondary | ICD-10-CM | POA: Diagnosis not present

## 2022-11-12 DIAGNOSIS — Z713 Dietary counseling and surveillance: Secondary | ICD-10-CM | POA: Diagnosis not present

## 2022-11-12 DIAGNOSIS — I509 Heart failure, unspecified: Secondary | ICD-10-CM | POA: Diagnosis not present

## 2022-11-12 DIAGNOSIS — I10 Essential (primary) hypertension: Secondary | ICD-10-CM | POA: Diagnosis not present

## 2022-11-12 DIAGNOSIS — E782 Mixed hyperlipidemia: Secondary | ICD-10-CM | POA: Diagnosis not present

## 2022-11-12 DIAGNOSIS — E1165 Type 2 diabetes mellitus with hyperglycemia: Secondary | ICD-10-CM | POA: Diagnosis not present

## 2022-11-12 DIAGNOSIS — I4891 Unspecified atrial fibrillation: Secondary | ICD-10-CM | POA: Diagnosis not present

## 2022-12-06 DIAGNOSIS — I340 Nonrheumatic mitral (valve) insufficiency: Secondary | ICD-10-CM | POA: Diagnosis not present

## 2022-12-06 DIAGNOSIS — I361 Nonrheumatic tricuspid (valve) insufficiency: Secondary | ICD-10-CM | POA: Diagnosis not present

## 2022-12-07 ENCOUNTER — Encounter: Payer: Self-pay | Admitting: Medical-Surgical

## 2022-12-08 ENCOUNTER — Other Ambulatory Visit: Payer: Self-pay | Admitting: Medical-Surgical

## 2022-12-08 DIAGNOSIS — I10 Essential (primary) hypertension: Secondary | ICD-10-CM

## 2023-02-18 ENCOUNTER — Other Ambulatory Visit: Payer: Self-pay

## 2023-02-18 ENCOUNTER — Ambulatory Visit (INDEPENDENT_AMBULATORY_CARE_PROVIDER_SITE_OTHER): Payer: Medicare HMO | Admitting: Medical-Surgical

## 2023-02-18 ENCOUNTER — Encounter: Payer: Self-pay | Admitting: Medical-Surgical

## 2023-02-18 VITALS — BP 189/112 | HR 77 | Ht 63.0 in | Wt 143.1 lb

## 2023-02-18 DIAGNOSIS — E785 Hyperlipidemia, unspecified: Secondary | ICD-10-CM

## 2023-02-18 DIAGNOSIS — I480 Paroxysmal atrial fibrillation: Secondary | ICD-10-CM | POA: Diagnosis not present

## 2023-02-18 DIAGNOSIS — K219 Gastro-esophageal reflux disease without esophagitis: Secondary | ICD-10-CM | POA: Diagnosis not present

## 2023-02-18 DIAGNOSIS — R413 Other amnesia: Secondary | ICD-10-CM | POA: Diagnosis not present

## 2023-02-18 DIAGNOSIS — Z7984 Long term (current) use of oral hypoglycemic drugs: Secondary | ICD-10-CM

## 2023-02-18 DIAGNOSIS — R399 Unspecified symptoms and signs involving the genitourinary system: Secondary | ICD-10-CM | POA: Diagnosis not present

## 2023-02-18 DIAGNOSIS — I10 Essential (primary) hypertension: Secondary | ICD-10-CM

## 2023-02-18 DIAGNOSIS — E118 Type 2 diabetes mellitus with unspecified complications: Secondary | ICD-10-CM | POA: Diagnosis not present

## 2023-02-18 LAB — POCT UA - MICROALBUMIN
Albumin/Creatinine Ratio, Urine, POC: <30
Creatinine, POC: 200 mg/dL
Microalbumin Ur, POC: 30 mg/L

## 2023-02-18 LAB — POCT GLYCOSYLATED HEMOGLOBIN (HGB A1C): Hemoglobin A1C: 6.1 % — AB (ref 4.0–5.6)

## 2023-02-18 MED ORDER — CLOTRIMAZOLE-BETAMETHASONE 1-0.05 % EX CREA: 1.0000 | TOPICAL_CREAM | Freq: Every day | CUTANEOUS | 11 refills | Status: DC

## 2023-02-18 MED ORDER — APIXABAN 5 MG PO TABS: 5.0000 mg | ORAL_TABLET | Freq: Two times a day (BID) | ORAL | 3 refills | Status: DC

## 2023-02-18 MED ORDER — SIMVASTATIN 10 MG PO TABS
10.0000 mg | ORAL_TABLET | Freq: Every day | ORAL | 3 refills | Status: DC
Start: 2023-02-18 — End: 2024-06-03

## 2023-02-18 MED ORDER — METOPROLOL SUCCINATE ER 50 MG PO TB24
150.0000 mg | ORAL_TABLET | Freq: Every day | ORAL | 0 refills | Status: DC
Start: 2023-02-18 — End: 2023-07-03

## 2023-02-18 MED ORDER — OXYBUTYNIN CHLORIDE ER 5 MG PO TB24
5.0000 mg | ORAL_TABLET | Freq: Every day | ORAL | 3 refills | Status: AC
Start: 2023-02-18 — End: ?

## 2023-02-18 MED ORDER — METFORMIN HCL ER 500 MG PO TB24
ORAL_TABLET | ORAL | 3 refills | Status: DC
Start: 2023-02-18 — End: 2024-05-25

## 2023-02-18 MED ORDER — OMEPRAZOLE 20 MG PO CPDR
20.0000 mg | DELAYED_RELEASE_CAPSULE | Freq: Every day | ORAL | 0 refills | Status: DC
Start: 2023-02-18 — End: 2024-05-22

## 2023-02-18 NOTE — Progress Notes (Signed)
Established patient visit  History, exam, impression, and plan:  1. Paroxysmal atrial fibrillation [facility]) Very pleasant 77 year old male accompanied by his wife who serves as English as a second language teacher.  He comes in today with reports that he was diagnosed with paroxysmal atrial fibrillation on 10/26/2022 during an ED visit.  They left the next day and went to [place] for several months.  There, they report seeing cardiology who made some medication changes and added to new medicines to the regimen.  Now that they have returned, they need to be connected with a new cardiologist in this area.  While in [place], he was started on Eliquis 5 mg twice daily however ran out of the medication and did not receive refills.  Unclear how long he has been off this medication.  On evaluation, heart rate irregular.  Lungs clear to auscultation with even unlabored respirations.  In office EKG showing rate of 58, atrial fibrillation, possible right ventricular hypertrophy.  He is currently taking Toprol XL 50 mg daily, plan to continue this.  Restarting Eliquis 5 mg twice daily. - Ambulatory referral to Cardiology - CBC with Differential/Platelet - Comprehensive metabolic panel - Lipid panel - TSH  2. Essential hypertension, benign History of essential hypertension with recent medication changes however we do not have this information on file.  Blood pressure on arrival is significantly elevated, worse on recheck at 189/112.  Since we do not have clarification on what he is being treated with at this time, asked his wife to let us know what his current medications are via MyChart.  Recommend she take pictures of the bottle so that we have full prescribing information for this.  Has not been monitoring blood pressure at home although the you have a blood pressure cuff.  Would like for her to monitor at home with a goal of 130/80 or less.  Referring to cardiology for further management.  Checking labs as below. -  Ambulatory referral to Cardiology - metoprolol succinate (TOPROL-XL) 50 MG 24 hr tablet; Take 3 tablets (150 mg total) by mouth daily. TAKE 3 TABLETS DAILY (TAKE WITH OR IMMEDIATELY AFTER FOOD).  Dispense: 270 tablet; Refill: 0 - CBC with Differential/Platelet - Comprehensive metabolic panel - Lipid panel - TSH  3. Memory changes Has had some significant memory changes recently and feels that he has difficulty finding the correct word for things.  He is bilingual and is unsure if this is difficulty finding the [place] word or if it is affecting his entire thought process.  Would like to pursue further testing for this or referring to neurology for neuropsych testing. - Ambulatory referral to Neurology  4. Controlled type 2 diabetes mellitus with complication, without long-term current use of insulin (HCC) History of controlled type 2 diabetes currently treated with metformin 500 mg daily.  Has not been checking sugars at home.  Over the last 2 years, his hemoglobin A1c has been great.  Recheck today at 6.1%.  Microalbumin normal.  Continue metformin 500 mg daily. - metFORMIN (GLUCOPHAGE-XR) 500 MG 24 hr tablet; TAKE 1 TABLET EVERY DAY WITH BREAKFAST  Dispense: 90 tablet; Refill: 3 - POCT HgB A1C - POCT UA - Microalbumin  5. Gastroesophageal reflux disease, unspecified whether esophagitis present History of GERD that is dependent on omeprazole.  Taking 20 mg daily and reports this is very helpful.  Without it, feels that he cannot eat due to severe reflux.  Refilling today. - omeprazole (PRILOSEC) 20 MG capsule; Take 1  capsule (20 mg total) by mouth daily.  Dispense: 90 capsule; Refill: 0  6. Lower urinary tract symptoms (LUTS) Taking oxybutynin 5 mg nightly for lower urinary tract symptoms.  Feels the medication works well and has no significant side effects.  Refilling today. - oxybutynin (DITROPAN-XL) 5 MG 24 hr tablet; Take 1 tablet (5 mg total) by mouth at bedtime.  Dispense: 90 tablet;  Refill: 3  7. Hyperlipidemia, unspecified hyperlipidemia type History of hyperlipidemia.  Due for recheck of lipids, ordered.  Taking simvastatin 10 mg daily, tolerating well without side effects.  Continue simvastatin as ordered with possible titration of dosing depending on results. - simvastatin (ZOCOR) 10 MG tablet; Take 1 tablet (10 mg total) by mouth at bedtime.  Dispense: 90 tablet; Refill: 3 - Lipid panel   Procedures performed this visit: None.  Return in about 4 weeks (around 03/18/2023) for HTN follow up.  __________________________________ Thayer Ohm, DNP, APRN, FNP-BC Primary Care and Sports Medicine [facility]

## 2023-02-19 MED ORDER — APIXABAN 5 MG PO TABS: 5.0000 mg | ORAL_TABLET | Freq: Two times a day (BID) | ORAL | 1 refills | Status: DC

## 2023-03-10 MED ORDER — AMLODIPINE-VALSARTAN-HCTZ 10-160-25 MG PO TABS: 1.0000 | ORAL_TABLET | Freq: Every day | ORAL | 3 refills | Status: DC

## 2023-03-10 MED ORDER — DOXAZOSIN MESYLATE 4 MG PO TABS: 4.0000 mg | ORAL_TABLET | Freq: Every evening | ORAL | 1 refills | Status: DC

## 2023-03-10 MED ORDER — FUROSEMIDE 20 MG PO TABS: 20.0000 mg | ORAL_TABLET | Freq: Every day | ORAL | 3 refills | Status: DC

## 2023-03-10 NOTE — Addendum Note (Signed)
Addended byChristen Butter on: 03/10/2023 03:16 PM   Modules accepted: Orders

## 2023-03-13 NOTE — Addendum Note (Signed)
Addended by: Chalmers Cater on: 03/13/2023 03:08 PM   Modules accepted: Orders

## 2023-03-19 ENCOUNTER — Ambulatory Visit: Payer: Medicare HMO | Admitting: Medical-Surgical

## 2023-03-19 DIAGNOSIS — H25811 Combined forms of age-related cataract, right eye: Secondary | ICD-10-CM | POA: Diagnosis not present

## 2023-03-19 DIAGNOSIS — Z961 Presence of intraocular lens: Secondary | ICD-10-CM | POA: Diagnosis not present

## 2023-03-19 DIAGNOSIS — H3589 Other specified retinal disorders: Secondary | ICD-10-CM | POA: Diagnosis not present

## 2023-03-25 ENCOUNTER — Encounter: Payer: Self-pay | Admitting: Medical-Surgical

## 2023-03-25 ENCOUNTER — Ambulatory Visit (INDEPENDENT_AMBULATORY_CARE_PROVIDER_SITE_OTHER): Payer: Medicare HMO | Admitting: Medical-Surgical

## 2023-03-25 VITALS — BP 132/64 | HR 55 | Resp 20 | Ht 63.0 in | Wt 142.8 lb

## 2023-03-25 DIAGNOSIS — R413 Other amnesia: Secondary | ICD-10-CM | POA: Diagnosis not present

## 2023-03-25 DIAGNOSIS — I480 Paroxysmal atrial fibrillation: Secondary | ICD-10-CM | POA: Diagnosis not present

## 2023-03-25 DIAGNOSIS — I10 Essential (primary) hypertension: Secondary | ICD-10-CM | POA: Diagnosis not present

## 2023-03-25 DIAGNOSIS — K219 Gastro-esophageal reflux disease without esophagitis: Secondary | ICD-10-CM | POA: Diagnosis not present

## 2023-03-25 MED ORDER — DOXAZOSIN MESYLATE 4 MG PO TABS: 4.0000 mg | ORAL_TABLET | Freq: Every evening | ORAL | 1 refills | Status: DC

## 2023-03-25 MED ORDER — AMLODIPINE-VALSARTAN-HCTZ 10-160-25 MG PO TABS: 1.0000 | ORAL_TABLET | Freq: Every day | ORAL | 3 refills | Status: DC

## 2023-03-25 MED ORDER — FUROSEMIDE 20 MG PO TABS: 20.0000 mg | ORAL_TABLET | Freq: Every day | ORAL | 3 refills | Status: DC

## 2023-03-25 MED ORDER — FAMOTIDINE 40 MG PO TABS
40.0000 mg | ORAL_TABLET | Freq: Every day | ORAL | 3 refills | Status: DC
Start: 2023-03-25 — End: 2024-06-03

## 2023-03-25 NOTE — Progress Notes (Signed)
        Established patient visit  History, exam, impression, and plan:  1. Gastroesophageal reflux disease, unspecified whether esophagitis present Pleasant 77 year old male accompanied by his wife presenting today with a history of GERD.  Has been taking famotidine 40 mg daily, tolerating well without side effects.  Feels the medication works very well and is requesting a refill.  Continue famotidine 40 mg daily. - famotidine (PEPCID) 40 MG tablet; Take 1 tablet (40 mg total) by mouth daily.  Dispense: 90 tablet; Refill: 3  2. Paroxysmal atrial fibrillation (HCC) Diagnosed in April with atrial fibrillation and has been placed on Eliquis twice daily as well as metoprolol.  At her last appointment, he continued to be in atrial fibrillation and a referral was placed to cardiology.  He has an appointment with Dr. Jens Lyons but this is not until November.  Continues to report significant fatigue but has not had any chest pain, shortness of breath, dizziness, lower extremity edema, abrupt vision changes, or syncopal episodes.  Plan to expedite cardiology referral to [facility] location for a sooner appointment.  3. Essential hypertension, benign Blood pressure remains elevated on arrival however recheck was actually at goal.  He has been without doxazosin and amlodipine-hydrochlorothiazide-valsartan as they did not get this from their pharmacy.  On review, this was sent to a local pharmacy rather than mail order and they did not go and pick it up.  Notes blood pressure has been significantly elevated at home with readings of 138/84, 163/108, 141/82, and 155/107.  Refilling all medications to the mail order pharmacy as requested today.  Continue to monitor blood pressure at home with a goal of 130/80 or less.  Recommend low-sodium diet and regular intentional activity.  Once established with cardiology, they will be able to manage this and hopefully consolidate medications to avoid risk of  orthostasis and polypharmacy.  4. Memory changes Referral in place for neuropsych testing however they have not set an appointment as this is in [place] and they have difficulty attending appointments that are outside of the [place] area.  Message sent to our referral coordinator to inquire if there is a closer location for this type of testing.  Per her response, there is nowhere in the [place] area that offers this but there are places in [place] as well as [place] they do.  MyChart message sent to inquire on patient preference of location.   Procedures performed this visit: None.  Return in about 3 months (around 06/24/2023) for chronic disease follow up.  __________________________________ Zachary Ohm, DNP, APRN, FNP-BC Primary Care and Sports Medicine [facility]

## 2023-05-03 ENCOUNTER — Other Ambulatory Visit: Payer: Self-pay | Admitting: Medical-Surgical

## 2023-05-03 DIAGNOSIS — I10 Essential (primary) hypertension: Secondary | ICD-10-CM

## 2023-05-06 DIAGNOSIS — H25811 Combined forms of age-related cataract, right eye: Secondary | ICD-10-CM | POA: Diagnosis not present

## 2023-05-07 DIAGNOSIS — Z961 Presence of intraocular lens: Secondary | ICD-10-CM | POA: Diagnosis not present

## 2023-05-14 DIAGNOSIS — Z961 Presence of intraocular lens: Secondary | ICD-10-CM | POA: Diagnosis not present

## 2023-05-20 NOTE — Progress Notes (Signed)
Caroline More, NP Reason for referral-atrial fibrillation  HPI: 77 year old male for evaluation of atrial fibrillation at request of Christen Butter, NP.  Patient recently found to be in atrial fibrillation April 2024 at time of routine colonoscopy.  Laboratories August 2024 showed hemoglobin 14.7, hgb A1c 6.1, creatinine 1.02, TSH 1.970.  Cardiology now asked to evaluate.  Patient denies dyspnea, chest pain, palpitations or syncope.  There appears to be some fatigue.  No bleeding.  Current Outpatient Medications  Medication Sig Dispense Refill   amLODIPine-Valsartan-HCTZ 10-160-25 MG TABS Take 1 tablet by mouth daily. 90 tablet 3   apixaban (ELIQUIS) 5 MG TABS tablet Take 1 tablet (5 mg total) by mouth 2 (two) times daily. 60 tablet 1   ascorbic acid (VITAMIN C) 500 MG tablet Take by mouth.     Ascorbic Acid CRYS Take by mouth.     B Complex-C (SUPER B COMPLEX PO) Take by mouth.     Cholecalciferol (D3) 50 MCG (2000 UT) TABS Take by mouth.     clotrimazole-betamethasone (LOTRISONE) cream Apply 1 Application topically daily. 30 g 11   cyanocobalamin (VITAMIN B12) 1000 MCG tablet Take by mouth.     diclofenac Sodium (VOLTAREN) 1 % GEL Apply 2 g topically 4 (four) times daily. To affected joint. 100 g 11   doxazosin (CARDURA) 4 MG tablet Take 1 tablet (4 mg total) by mouth at bedtime. 90 tablet 1   famotidine (PEPCID) 40 MG tablet Take 1 tablet (40 mg total) by mouth daily. 90 tablet 3   Folic Acid-Vit B6-Vit B12 0.8-10-0.115 MG TABS      furosemide (LASIX) 20 MG tablet Take 1 tablet (20 mg total) by mouth daily. 90 tablet 3   metFORMIN (GLUCOPHAGE-XR) 500 MG 24 hr tablet TAKE 1 TABLET EVERY DAY WITH BREAKFAST 90 tablet 3   metoprolol succinate (TOPROL-XL) 50 MG 24 hr tablet Take 3 tablets (150 mg total) by mouth daily. TAKE 3 TABLETS DAILY (TAKE WITH OR IMMEDIATELY AFTER FOOD). 270 tablet 0   Misc Natural Products (ADV TURMERIC CURCUMIN COMPLEX PO) Take 3,000 mg by mouth daily.      omeprazole (PRILOSEC) 20 MG capsule Take 1 capsule (20 mg total) by mouth daily. 90 capsule 0   oxybutynin (DITROPAN-XL) 5 MG 24 hr tablet Take 1 tablet (5 mg total) by mouth at bedtime. 90 tablet 3   simvastatin (ZOCOR) 10 MG tablet Take 1 tablet (10 mg total) by mouth at bedtime. 90 tablet 3   No current facility-administered medications for this visit.    No Known Allergies   Past Medical History:  Diagnosis Date   Arthritis    osteoarthritis   GERD (gastroesophageal reflux disease)    Hypertension    Nasal polyps    removed   Peptic ulcer    hx. of   Prediabetes 04/20/2014   Chronic, stable   Transfusion history 04/22/2013   15 yrs ago-"bleeding ulcer"    Past Surgical History:  Procedure Laterality Date   CATARACT EXTRACTION Left 04-22-13   COLONOSCOPY     JOINT REPLACEMENT     Right total knee arthroplasty   NASAL SINUS SURGERY     polyps revoved 08-2017   STOMACH SURGERY     oversew of bleeding ulcer   TOTAL KNEE ARTHROPLASTY Left 04/27/2013   Procedure: LEFT TOTAL KNEE ARTHROPLASTY;  Surgeon: Loanne Drilling, MD;  Location: [facility];  Service: Orthopedics;  Laterality: Left;   TOTAL KNEE ARTHROPLASTY Right 03/24/2018  Procedure: RIGHT TOTAL KNEE ARTHROPLASTY;  Surgeon: Ollen Gross, MD;  Location: [facility];  Service: Orthopedics;  Laterality: Right;    Social History   Socioeconomic History   Marital status: Married    Spouse name: Mary   Number of children: 10   Years of education: 4th grade   Highest education level: 4th grade  Occupational History   Occupation: Retired  Tobacco Use   Smoking status: Former    Current packs/day: 0.00    Types: Cigarettes    Quit date: 04/23/1979    Years since quitting: 44.1   Smokeless tobacco: Never  Vaping Use   Vaping status: Never Used  Substance and Sexual Activity   Alcohol use: Yes    Alcohol/week: 6.0 standard drinks of alcohol    Types: 6 Cans of beer per week    Comment: 4 beers per day   Drug use: No    Sexual activity: Yes    Partners: Female  Other Topics Concern   Not on file  Social History Narrative   Lives with his wife and 2 children and 2 grand children. He likes to do yard work in his free time. He drink 3-6 beers a week.   Social Determinants of Health   Financial Resource Strain: Low Risk  (09/07/2020)   Overall Financial Resource Strain (CARDIA)    Difficulty of Paying Living Expenses: Not hard at all  Food Insecurity: No Food Insecurity (09/07/2020)   Hunger Vital Sign    Worried About Running Out of Food in the Last Year: Never true    Ran Out of Food in the Last Year: Never true  Transportation Needs: No Transportation Needs (09/07/2020)   PRAPARE - Administrator, Civil Service (Medical): No    Lack of Transportation (Non-Medical): No  Physical Activity: Inactive (09/07/2020)   Exercise Vital Sign    Days of Exercise per Week: 0 days    Minutes of Exercise per Session: 0 min  Stress: No Stress Concern Present (09/07/2020)   [employer] of Occupational Health - Occupational Stress Questionnaire    Feeling of Stress : Not at all  Social Connections: Unknown (07/19/2022)   Received from [facility], [facility]   Social Network    Social Network: Not on file  Intimate Partner Violence: Not At Risk (10/26/2022)   Received from [facility], [facility]   HITS    Over the last 12 months how often did your partner physically hurt you?: Never    Over the last 12 months how often did your partner insult you or talk down to you?: Never    Over the last 12 months how often did your partner threaten you with physical harm?: Never    Over the last 12 months how often did your partner scream or curse at you?: Never    Family History  Problem Relation Age of Onset   Cancer Sister     ROS: no fevers or chills, productive cough, hemoptysis, dysphasia, odynophagia, melena, hematochezia, dysuria, hematuria, rash, seizure activity, orthopnea, PND, pedal  edema, claudication. Remaining systems are negative.  Physical Exam:   Blood pressure (!) 140/62, pulse 77, height 5\' 3"  (1.6 m), weight 148 lb (67.1 kg), SpO2 98%.  General:  Well developed/well nourished in NAD Skin warm/dry Patient not depressed No peripheral clubbing Back-normal HEENT-normal/normal eyelids Neck supple/normal carotid upstroke bilaterally; no bruits; no JVD; no thyromegaly chest - CTA/ normal expansion CV - irregular /normal S1 and S2; no  murmurs, rubs or gallops;  PMI nondisplaced Abdomen -NT/ND, no HSM, no mass, + bowel sounds, no bruit 2+ femoral pulses, no bruits Ext-no edema, chords, 2+ DP Neuro-grossly nonfocal  EKG Interpretation Date/Time:  Monday June 03 2023 13:53:35 EST Ventricular Rate:  77 PR Interval:    QRS Duration:  80 QT Interval:  372 QTC Calculation: 420 R Axis:   137  Text Interpretation: Atrial fibrillation Possible Right ventricular hypertrophy Cannot rule out Inferior infarct , age undetermined No previous ECGs available Confirmed by Olga Millers ([id number]) on 06/03/2023 1:55:02 PM    ECG - 02/18/23 Atrial fibrillation, RAD; personally reviewed  A/P  1 persistent atrial fibrillation-patient remains in atrial fibrillation.  Will continue metoprolol for rate control.  Continue apixaban.  Schedule echocardiogram for LV function.  Recent TSH normal.  I discussed options of proceeding with cardioversion with patient and his wife.  They declined cardioversion.  They understand that the longer he is in atrial fibrillation the more difficult it may be to restore sinus rhythm in the future.  For now we will plan rate control and anticoagulation.  2 hypertension-blood pressure is borderline.  Will follow and adjust regimen as needed.  Olga Millers, MD

## 2023-06-03 ENCOUNTER — Ambulatory Visit: Payer: Medicare HMO | Admitting: Cardiology

## 2023-06-03 ENCOUNTER — Encounter: Payer: Self-pay | Admitting: Cardiology

## 2023-06-03 VITALS — BP 140/62 | HR 77 | Ht 63.0 in | Wt 148.0 lb

## 2023-06-03 DIAGNOSIS — R0989 Other specified symptoms and signs involving the circulatory and respiratory systems: Secondary | ICD-10-CM | POA: Diagnosis not present

## 2023-06-03 DIAGNOSIS — I4819 Other persistent atrial fibrillation: Secondary | ICD-10-CM

## 2023-06-03 DIAGNOSIS — I10 Essential (primary) hypertension: Secondary | ICD-10-CM

## 2023-06-03 DIAGNOSIS — Z09 Encounter for follow-up examination after completed treatment for conditions other than malignant neoplasm: Secondary | ICD-10-CM

## 2023-06-03 MED ORDER — HYDROCHLOROTHIAZIDE 25 MG PO TABS: 25.0000 mg | ORAL_TABLET | Freq: Every day | ORAL | 3 refills | Status: DC

## 2023-06-03 MED ORDER — AMLODIPINE BESYLATE 10 MG PO TABS: 10.0000 mg | ORAL_TABLET | Freq: Every day | ORAL | 3 refills | Status: DC

## 2023-06-03 MED ORDER — APIXABAN 5 MG PO TABS: 5.0000 mg | ORAL_TABLET | Freq: Two times a day (BID) | ORAL | 1 refills | Status: AC

## 2023-06-03 MED ORDER — VALSARTAN 160 MG PO TABS: 160.0000 mg | ORAL_TABLET | Freq: Every day | ORAL | 3 refills | Status: DC

## 2023-06-03 NOTE — Addendum Note (Signed)
Addended by: Freddi Starr on: 06/03/2023 02:29 PM   Modules accepted: Orders

## 2023-06-03 NOTE — Patient Instructions (Signed)
    Testing/Procedures:  Your physician has requested that you have an echocardiogram. Echocardiography is a painless test that uses sound waves to create images of your heart. It provides your doctor with information about the size and shape of your heart and how well your heart's chambers and valves are working. This procedure takes approximately one hour. There are no restrictions for this procedure. Please do NOT wear cologne, perfume, aftershave, or lotions (deodorant is allowed). Please arrive 15 minutes prior to your appointment time. [facility]- [address], [place] IMAGING DEPARTMENT  Your physician has requested that you have an abdominal aorta duplex. During this test, an ultrasound is used to evaluate the aorta. Allow 30 minutes for this exam. Do not eat after midnight the day before and avoid carbonated beverages.[facility] IMAGING DEPARTMENT  Follow-Up: At [facility], you and your health needs are our priority.  As part of our continuing mission to provide you with exceptional heart care, we have created designated Provider Care Teams.  These Care Teams include your primary Cardiologist (physician) and Advanced Practice Providers (APPs -  Physician Assistants and Nurse Practitioners) who all work together to provide you with the care you need, when you need it.  We recommend signing up for the patient portal called "MyChart".  Sign up information is provided on this After Visit Summary.  MyChart is used to connect with patients for Virtual Visits (Telemedicine).  Patients are able to view lab/test results, encounter notes, upcoming appointments, etc.  Non-urgent messages can be sent to your provider as well.   To learn more about what you can do with MyChart, go to [email or web address].    Your next appointment:   4 month(s)  Provider:   Olga Millers, MD

## 2023-06-04 LAB — BASIC METABOLIC PANEL
BUN/Creatinine Ratio: 11 (ref 10–24)
BUN: 11 mg/dL (ref 8–27)
CO2: 23 mmol/L (ref 20–29)
Calcium: 9.1 mg/dL (ref 8.6–10.2)
Chloride: 107 mmol/L — ABNORMAL HIGH (ref 96–106)
Creatinine, Ser: 0.98 mg/dL (ref 0.76–1.27)
Glucose: 98 mg/dL (ref 70–99)
Potassium: 4.2 mmol/L (ref 3.5–5.2)
Sodium: 147 mmol/L — ABNORMAL HIGH (ref 134–144)
eGFR: 79 mL/min/{1.73_m2} (ref >59)

## 2023-06-10 ENCOUNTER — Ambulatory Visit: Payer: Medicare HMO

## 2023-06-10 DIAGNOSIS — R0989 Other specified symptoms and signs involving the circulatory and respiratory systems: Secondary | ICD-10-CM

## 2023-06-10 DIAGNOSIS — I700 Atherosclerosis of aorta: Secondary | ICD-10-CM | POA: Diagnosis not present

## 2023-06-10 DIAGNOSIS — E785 Hyperlipidemia, unspecified: Secondary | ICD-10-CM | POA: Diagnosis not present

## 2023-06-10 DIAGNOSIS — I10 Essential (primary) hypertension: Secondary | ICD-10-CM | POA: Diagnosis not present

## 2023-06-11 DIAGNOSIS — Z961 Presence of intraocular lens: Secondary | ICD-10-CM | POA: Diagnosis not present

## 2023-06-24 ENCOUNTER — Ambulatory Visit (HOSPITAL_BASED_OUTPATIENT_CLINIC_OR_DEPARTMENT_OTHER): Payer: Medicare HMO

## 2023-07-03 ENCOUNTER — Other Ambulatory Visit: Payer: Self-pay | Admitting: Medical-Surgical

## 2023-07-03 DIAGNOSIS — I10 Essential (primary) hypertension: Secondary | ICD-10-CM

## 2023-09-16 ENCOUNTER — Other Ambulatory Visit: Payer: Self-pay | Admitting: Medical-Surgical

## 2023-09-16 DIAGNOSIS — I10 Essential (primary) hypertension: Secondary | ICD-10-CM

## 2023-09-20 NOTE — Progress Notes (Signed)
 HPI: FU atrial fibrillation.  Found to be in atrial fibrillation April 2024 at time of routine colonoscopy.  Abdominal ultrasound November 2024 showed moderate abdominal aortic atherosclerotic plaque but no aneurysm.  Echocardiogram ordered at last office visit but not performed.  Since last seen patient has not had dyspnea, chest pain, palpitations or syncope.  He has not been taking his apixaban.  His wife states he does not want to take that medication and she has explained the risks of CVA.  He also does not want to proceed with the echocardiogram due to expense.  Current Outpatient Medications  Medication Sig Dispense Refill   apixaban (ELIQUIS) 5 MG TABS tablet Take 1 tablet (5 mg total) by mouth 2 (two) times daily. 60 tablet 1   clotrimazole-betamethasone (LOTRISONE) cream Apply 1 Application topically daily. 30 g 11   cyanocobalamin (VITAMIN B12) 1000 MCG tablet Take by mouth.     diclofenac Sodium (VOLTAREN) 1 % GEL Apply 2 g topically 4 (four) times daily. To affected joint. 100 g 11   doxazosin (CARDURA) 4 MG tablet Take 1 tablet (4 mg total) by mouth at bedtime. 90 tablet 1   famotidine (PEPCID) 40 MG tablet Take 1 tablet (40 mg total) by mouth daily. 90 tablet 3   furosemide (LASIX) 20 MG tablet Take 1 tablet (20 mg total) by mouth daily. 90 tablet 3   metFORMIN (GLUCOPHAGE-XR) 500 MG 24 hr tablet TAKE 1 TABLET EVERY DAY WITH BREAKFAST 90 tablet 3   metoprolol succinate (TOPROL-XL) 50 MG 24 hr tablet TAKE 3 TABLETS DAILY (TAKE WITH OR IMMEDIATELY AFTER FOOD). NEEDS APPOINTMENT FOR FURTHER REFILLS 270 tablet 0   Misc Natural Products (ADV TURMERIC CURCUMIN COMPLEX PO) Take 3,000 mg by mouth daily.     omeprazole (PRILOSEC) 20 MG capsule Take 1 capsule (20 mg total) by mouth daily. 90 capsule 0   oxybutynin (DITROPAN-XL) 5 MG 24 hr tablet Take 1 tablet (5 mg total) by mouth at bedtime. 90 tablet 3   simvastatin (ZOCOR) 10 MG tablet Take 1 tablet (10 mg total) by mouth at bedtime.  90 tablet 3   valsartan (DIOVAN) 160 MG tablet Take 1 tablet (160 mg total) by mouth daily. 90 tablet 3   amLODipine (NORVASC) 10 MG tablet Take 1 tablet (10 mg total) by mouth daily. 90 tablet 3   ascorbic acid (VITAMIN C) 500 MG tablet Take by mouth. (Patient not taking: Reported on 10/02/2023)     Ascorbic Acid CRYS Take by mouth. (Patient not taking: Reported on 10/02/2023)     B Complex-C (SUPER B COMPLEX PO) Take by mouth. (Patient not taking: Reported on 10/02/2023)     Cholecalciferol (D3) 50 MCG (2000 UT) TABS Take by mouth. (Patient not taking: Reported on 10/02/2023)     Folic Acid-Vit B6-Vit B12 0.8-10-0.115 MG TABS  (Patient not taking: Reported on 10/02/2023)     hydrochlorothiazide (HYDRODIURIL) 25 MG tablet Take 1 tablet (25 mg total) by mouth daily. 90 tablet 3   No current facility-administered medications for this visit.     Past Medical History:  Diagnosis Date   Arthritis    osteoarthritis   GERD (gastroesophageal reflux disease)    Hypertension    Nasal polyps    removed   Peptic ulcer    hx. of   Prediabetes 04/20/2014   Chronic, stable   Transfusion history 04/22/2013   15 yrs ago-"bleeding ulcer"    Past Surgical History:  Procedure Laterality Date  CATARACT EXTRACTION Left 04-22-13   COLONOSCOPY     JOINT REPLACEMENT     Right total knee arthroplasty   NASAL SINUS SURGERY     polyps revoved 08-2017   STOMACH SURGERY     oversew of bleeding ulcer   TOTAL KNEE ARTHROPLASTY Left 04/27/2013   Procedure: LEFT TOTAL KNEE ARTHROPLASTY;  Surgeon: Loanne Drilling, MD;  Location: [facility];  Service: Orthopedics;  Laterality: Left;   TOTAL KNEE ARTHROPLASTY Right 03/24/2018   Procedure: RIGHT TOTAL KNEE ARTHROPLASTY;  Surgeon: Ollen Gross, MD;  Location: [facility];  Service: Orthopedics;  Laterality: Right;    Social History   Socioeconomic History   Marital status: Married    Spouse name: Mary   Number of children: 10   Years of education: 4th grade    Highest education level: 4th grade  Occupational History   Occupation: Retired  Tobacco Use   Smoking status: Former    Current packs/day: 0.00    Types: Cigarettes    Quit date: 04/23/1979    Years since quitting: 44.4   Smokeless tobacco: Never  Vaping Use   Vaping status: Never Used  Substance and Sexual Activity   Alcohol use: Yes    Alcohol/week: 6.0 standard drinks of alcohol    Types: 6 Cans of beer per week    Comment: 4 beers per day   Drug use: No   Sexual activity: Yes    Partners: Female  Other Topics Concern   Not on file  Social History Narrative   Lives with his wife and 2 children and 2 grand children. He likes to do yard work in his free time. He drink 3-6 beers a week.   Social Drivers of Corporate investment banker Strain: Low Risk  (09/07/2020)   Overall Financial Resource Strain (CARDIA)    Difficulty of Paying Living Expenses: Not hard at all  Food Insecurity: No Food Insecurity (09/07/2020)   Hunger Vital Sign    Worried About Running Out of Food in the Last Year: Never true    Ran Out of Food in the Last Year: Never true  Transportation Needs: No Transportation Needs (09/07/2020)   PRAPARE - Administrator, Civil Service (Medical): No    Lack of Transportation (Non-Medical): No  Physical Activity: Inactive (09/07/2020)   Exercise Vital Sign    Days of Exercise per Week: 0 days    Minutes of Exercise per Session: 0 min  Stress: No Stress Concern Present (09/07/2020)   [employer] of Occupational Health - Occupational Stress Questionnaire    Feeling of Stress : Not at all  Social Connections: Unknown (07/19/2022)   Received from [facility], [facility]   Social Network    Social Network: Not on file  Intimate Partner Violence: Not At Risk (10/26/2022)   Received from [facility]   HITS    Over the last 12 months how often did your partner physically hurt you?: Never    Over the last 12 months how often did your  partner insult you or talk down to you?: Never    Over the last 12 months how often did your partner threaten you with physical harm?: Never    Over the last 12 months how often did your partner scream or curse at you?: Never    Family History  Problem Relation Age of Onset   Cancer Sister     ROS: no fevers or chills, productive cough, hemoptysis,  dysphasia, odynophagia, melena, hematochezia, dysuria, hematuria, rash, seizure activity, orthopnea, PND, pedal edema, claudication. Remaining systems are negative.  Physical Exam: Well-developed well-nourished in no acute distress.  Skin is warm and dry.  HEENT is normal.  Neck is supple.  Chest is clear to auscultation with normal expansion.  Cardiovascular exam is irregular Abdominal exam nontender or distended. No masses palpated. Extremities show no edema. neuro grossly intact  A/P  1 persistent atrial fibrillation-continue metoprolol for rate control.  Long discussion with patient and wife today.  I prescribed apixaban at previous office visit but he is not taking this as his wife states "he does not want to and does not care".  He understands the risk of CVA including death but apparently has continued to decline apixaban.  She also stated there was an issue with expense.  I offered to arrange an evaluation with case management for medication assistance today but she refused.  They continue to decline echocardiogram today due to expense.  They also do not wish to return for follow-up.  I explained that I would be happy to assist them anytime they change their mind.    2 hypertension-blood pressure controlled.  Continue present medications.  Olga Millers, MD

## 2023-10-02 ENCOUNTER — Ambulatory Visit (INDEPENDENT_AMBULATORY_CARE_PROVIDER_SITE_OTHER): Payer: Medicare HMO | Admitting: Cardiology

## 2023-10-02 ENCOUNTER — Encounter: Payer: Self-pay | Admitting: Cardiology

## 2023-10-02 VITALS — BP 132/81 | HR 74 | Ht 63.0 in | Wt 150.0 lb

## 2023-10-02 DIAGNOSIS — I4819 Other persistent atrial fibrillation: Secondary | ICD-10-CM | POA: Diagnosis not present

## 2023-10-02 DIAGNOSIS — I10 Essential (primary) hypertension: Secondary | ICD-10-CM | POA: Diagnosis not present

## 2023-10-02 NOTE — Patient Instructions (Signed)
  Follow-Up: At [facility], you and your health needs are our priority.  As part of our continuing mission to provide you with exceptional heart care, we have created designated Provider Care Teams.  These Care Teams include your primary Cardiologist (physician) and Advanced Practice Providers (APPs -  Physician Assistants and Nurse Practitioners) who all work together to provide you with the care you need, when you need it.  We recommend signing up for the patient portal called "MyChart".  Sign up information is provided on this After Visit Summary.  MyChart is used to connect with patients for Virtual Visits (Telemedicine).  Patients are able to view lab/test results, encounter notes, upcoming appointments, etc.  Non-urgent messages can be sent to your provider as well.   To learn more about what you can do with MyChart, go to [email or web address].    Your next appointment:    AS NEEDED   

## 2023-11-30 ENCOUNTER — Other Ambulatory Visit: Payer: Self-pay | Admitting: Medical-Surgical

## 2023-11-30 DIAGNOSIS — I10 Essential (primary) hypertension: Secondary | ICD-10-CM

## 2024-02-14 ENCOUNTER — Other Ambulatory Visit: Payer: Self-pay | Admitting: Medical-Surgical

## 2024-02-14 DIAGNOSIS — I10 Essential (primary) hypertension: Secondary | ICD-10-CM

## 2024-03-10 ENCOUNTER — Ambulatory Visit: Admitting: Medical-Surgical

## 2024-03-24 ENCOUNTER — Encounter: Payer: Self-pay | Admitting: Medical-Surgical

## 2024-03-24 ENCOUNTER — Ambulatory Visit (INDEPENDENT_AMBULATORY_CARE_PROVIDER_SITE_OTHER): Admitting: Medical-Surgical

## 2024-03-24 VITALS — BP 155/70 | HR 49 | Resp 20 | Ht 63.0 in | Wt 141.0 lb

## 2024-03-24 DIAGNOSIS — E559 Vitamin D deficiency, unspecified: Secondary | ICD-10-CM

## 2024-03-24 DIAGNOSIS — R413 Other amnesia: Secondary | ICD-10-CM | POA: Diagnosis not present

## 2024-03-24 DIAGNOSIS — F1010 Alcohol abuse, uncomplicated: Secondary | ICD-10-CM

## 2024-03-24 DIAGNOSIS — R8290 Unspecified abnormal findings in urine: Secondary | ICD-10-CM | POA: Diagnosis not present

## 2024-03-24 DIAGNOSIS — Z125 Encounter for screening for malignant neoplasm of prostate: Secondary | ICD-10-CM | POA: Diagnosis not present

## 2024-03-24 DIAGNOSIS — R5383 Other fatigue: Secondary | ICD-10-CM | POA: Diagnosis not present

## 2024-03-24 DIAGNOSIS — E1159 Type 2 diabetes mellitus with other circulatory complications: Secondary | ICD-10-CM

## 2024-03-24 DIAGNOSIS — I10 Essential (primary) hypertension: Secondary | ICD-10-CM | POA: Diagnosis not present

## 2024-03-24 DIAGNOSIS — Z7984 Long term (current) use of oral hypoglycemic drugs: Secondary | ICD-10-CM

## 2024-03-24 DIAGNOSIS — I480 Paroxysmal atrial fibrillation: Secondary | ICD-10-CM

## 2024-03-24 DIAGNOSIS — E118 Type 2 diabetes mellitus with unspecified complications: Secondary | ICD-10-CM

## 2024-03-24 LAB — POCT URINALYSIS DIP (CLINITEK)
Blood, UA: NEGATIVE
Glucose, UA: NEGATIVE mg/dL
Ketones, POC UA: NEGATIVE mg/dL
Leukocytes, UA: NEGATIVE
Nitrite, UA: NEGATIVE
POC PROTEIN,UA: 30 — AB
Spec Grav, UA: 1.02 (ref 1.010–1.025)
Urobilinogen, UA: 2 U/dL — AB
pH, UA: 6 (ref 5.0–8.0)

## 2024-03-24 LAB — POCT GLYCOSYLATED HEMOGLOBIN (HGB A1C)
HbA1c, POC (controlled diabetic range): 5.9 % (ref 0.0–7.0)
Hemoglobin A1C: 5.9 % — AB (ref 4.0–5.6)

## 2024-03-24 LAB — POCT UA - MICROALBUMIN
Creatinine, POC: 300 mg/dL
Microalbumin Ur, POC: 80 mg/L

## 2024-03-24 MED ORDER — VALSARTAN 320 MG PO TABS: 320.0000 mg | ORAL_TABLET | Freq: Every day | ORAL | 3 refills | Status: AC

## 2024-03-24 NOTE — Progress Notes (Signed)
 Established patient visit   History of Present Illness   Discussed the use of AI scribe software for clinical note transcription with the patient, who gave verbal consent to proceed.  History of Present Illness   Zachary Lyons is a 78 year old male with atrial fibrillation who presents for a medication check and to have a power of attorney form filled out. He is accompanied by his wife, Ronal.  Atrial fibrillation and anticoagulation management - Atrial fibrillation with prior use of Eliquis , discontinued due to cost - Not currently on any anticoagulant therapy - Unwilling to undergo frequent blood tests required for warfarin therapy - Unable to take baby aspirin  due to regular alcohol consumption (5-6 cases (#24 count each) of [employer] monthly)  Cognitive impairment - Difficulty recalling personal information such as birthday and current year - Frequently relies on his wife for answers to orientation questions - Wife requests healthcare power of attorney form completion to assist with healthcare decisions due to memory lapses  Fatigue and exercise intolerance - Fatigue, particularly after physical activity - Increased sleep duration during colder months  Diabetes mellitus - Last hemoglobin A1c was 6.1 on February 18, 2023, lost to follow up - Currently taking metformin  as prescribed - Does not monitor blood glucose at home  Hypertension and cardiovascular risk factors - Currently taking doxazosin , Lasix , hydrochlorothiazide , Toprol  XL, valsartan , and Zocor  - Does not monitor blood pressure at home  Medication management and adherence - Has stopped taking vitamins - Requests sufficient medication supply for planned travel to [place]  from November to March       Physical Exam   Physical Exam Vitals and nursing note reviewed.  Constitutional:      General: He is not in acute distress.    Appearance: Normal appearance.  HENT:     Head:  Normocephalic and atraumatic.     Mouth/Throat:     Dentition: Abnormal dentition. Has dentures (poorly fitted).  Cardiovascular:     Rate and Rhythm: Normal rate and regular rhythm.     Pulses: Normal pulses.     Heart sounds: Normal heart sounds. No murmur heard.    No friction rub. No gallop.  Pulmonary:     Effort: Pulmonary effort is normal. No respiratory distress.     Breath sounds: Normal breath sounds.  Skin:    General: Skin is warm and dry.  Neurological:     Mental Status: He is alert. He is disoriented.  Psychiatric:        Mood and Affect: Mood normal.        Behavior: Behavior normal.        Thought Content: Thought content normal.        Judgment: Judgment normal.    Assessment & Plan   Assessment and Plan    Atrial fibrillation Management complicated by financial constraints and medication adherence issues. Not on anticoagulants due to cost and monitoring concerns. Aspirin  not suitable due to alcohol use.  - Consult clinical pharmacy for alternative anticoagulants like Xarelto  or Plavix. - Investigate patient assistance programs or insurance options to reduce medication costs. - Communicated findings and options via MyChart.  Cognitive impairment Significant memory issues and disorientation affecting legal form completion. Fatigue with increased sleepiness recently. - Placed an urgent referral to neuropsychiatry for further evaluation. - Checking comprehensive blood work to evaluate for metabolic contributors to symptoms.    Alcohol use disorder Daily consumption of copious amounts of beer affects medication options  and health management. - Check B1 thiamine level. - Recommend limiting alcohol and beginning to cut back quantity on a weekly/monthly basis.  Hypertension Blood pressure management ongoing. Slightly elevated today. No recent home monitoring. - Recheck remains elevated.  - Recommend alcohol cessation. - Continue current medications at current  doses but will plan to increase Valsartan  to 320mg  daily.  - Recommend home monitoring with a goal of 130/80 or less.  Type 2 diabetes mellitus Managed with metformin . Last A1c 6.1% on February 18, 2023, indicating good control. No recent home blood sugar monitoring. - Recheck of A1c at 5.9%. - Urine microalbumin abnormal showing renal stress, unclear if r/t diabetes or uncontrolled BP.  - Continue Metformin  as prescribed.    General Health Maintenance Routine health maintenance due, including prostate cancer screening and lab work. Stopped taking vitamins, due for recheck. - Order PSA for prostate cancer screening. - Order lipid panel, thyroid  function tests, iron studies, complete blood count, electrolytes, kidney function, liver function, vitamin D , vitamin B12, and folate levels.  Goals of Care Healthcare power of attorney discussion initiated but incomplete due to cognitive impairment. - Consider legal options such as Presenter, broadcasting for decision-making authority.     Follow up   Return in about 6 months (around 09/21/2024) for chronic disease follow up. __________________________________ Zada FREDRIK Palin, DNP, APRN, FNP-BC Primary Care and Sports Medicine [facility] [place]

## 2024-03-25 LAB — IRON AND TIBC
Iron Saturation: 21 % (ref 15–55)
Iron: 91 ug/dL (ref 38–169)
Total Iron Binding Capacity: 441 ug/dL (ref 250–450)
UIBC: 350 ug/dL — ABNORMAL HIGH (ref 111–343)

## 2024-03-25 LAB — FERRITIN: Ferritin: 35 ng/mL (ref 30–400)

## 2024-03-26 ENCOUNTER — Ambulatory Visit: Payer: Self-pay | Admitting: Medical-Surgical

## 2024-03-26 DIAGNOSIS — D696 Thrombocytopenia, unspecified: Secondary | ICD-10-CM

## 2024-03-30 LAB — CBC WITH DIFFERENTIAL/PLATELET
Basophils Absolute: 0 x10E3/uL (ref 0.0–0.2)
Basos: 1 %
EOS (ABSOLUTE): 0.1 x10E3/uL (ref 0.0–0.4)
Eos: 2 %
Hematocrit: 48 % (ref 37.5–51.0)
Hemoglobin: 15.4 g/dL (ref 13.0–17.7)
Immature Grans (Abs): 0 x10E3/uL (ref 0.0–0.1)
Immature Granulocytes: 0 %
Lymphocytes Absolute: 1.8 x10E3/uL (ref 0.7–3.1)
Lymphs: 33 %
MCH: 30.6 pg (ref 26.6–33.0)
MCHC: 32.1 g/dL (ref 31.5–35.7)
MCV: 95 fL (ref 79–97)
Monocytes Absolute: 0.4 x10E3/uL (ref 0.1–0.9)
Monocytes: 7 %
Neutrophils Absolute: 3.1 x10E3/uL (ref 1.4–7.0)
Neutrophils: 57 %
Platelets: 94 x10E3/uL — CL (ref 150–450)
RBC: 5.03 x10E6/uL (ref 4.14–5.80)
RDW: 14.3 % (ref 11.6–15.4)
WBC: 5.4 x10E3/uL (ref 3.4–10.8)

## 2024-03-30 LAB — CMP14+EGFR
ALT: 10 IU/L (ref 0–44)
AST: 28 IU/L (ref 0–40)
Albumin: 4.3 g/dL (ref 3.8–4.8)
Alkaline Phosphatase: 135 IU/L — ABNORMAL HIGH (ref 44–121)
BUN/Creatinine Ratio: 17 (ref 10–24)
BUN: 18 mg/dL (ref 8–27)
Bilirubin Total: 0.8 mg/dL (ref 0.0–1.2)
CO2: 19 mmol/L — ABNORMAL LOW (ref 20–29)
Calcium: 9.4 mg/dL (ref 8.6–10.2)
Chloride: 103 mmol/L (ref 96–106)
Creatinine, Ser: 1.08 mg/dL (ref 0.76–1.27)
Globulin, Total: 3.6 g/dL (ref 1.5–4.5)
Glucose: 95 mg/dL (ref 70–99)
Potassium: 4 mmol/L (ref 3.5–5.2)
Sodium: 144 mmol/L (ref 134–144)
Total Protein: 7.9 g/dL (ref 6.0–8.5)
eGFR: 70 mL/min/1.73 (ref >59)

## 2024-03-30 LAB — TSH: TSH: 1.38 u[IU]/mL (ref 0.450–4.500)

## 2024-03-30 LAB — LIPID PANEL
Chol/HDL Ratio: 3.5 ratio (ref 0.0–5.0)
Cholesterol, Total: 188 mg/dL (ref 100–199)
HDL: 53 mg/dL (ref >39)
LDL Chol Calc (NIH): 110 mg/dL — ABNORMAL HIGH (ref 0–99)
Triglycerides: 140 mg/dL (ref 0–149)
VLDL Cholesterol Cal: 25 mg/dL (ref 5–40)

## 2024-03-30 LAB — VITAMIN D 25 HYDROXY (VIT D DEFICIENCY, FRACTURES): Vit D, 25-Hydroxy: 40.8 ng/mL (ref 30.0–100.0)

## 2024-03-30 LAB — PSA TOTAL (REFLEX TO FREE): Prostate Specific Ag, Serum: 4.3 ng/mL — ABNORMAL HIGH (ref 0.0–4.0)

## 2024-03-30 LAB — VITAMIN B1: Thiamine: 92.2 nmol/L (ref 66.5–200.0)

## 2024-03-30 LAB — FPSA% REFLEX
% FREE PSA: 24.4 %
PSA, FREE: 1.05 ng/mL

## 2024-03-30 LAB — VITAMIN B12: Vitamin B-12: 304 pg/mL (ref 232–1245)

## 2024-04-16 ENCOUNTER — Ambulatory Visit (INDEPENDENT_AMBULATORY_CARE_PROVIDER_SITE_OTHER): Admitting: Podiatry

## 2024-04-16 DIAGNOSIS — Z91199 Patient's noncompliance with other medical treatment and regimen due to unspecified reason: Secondary | ICD-10-CM

## 2024-04-17 ENCOUNTER — Encounter: Payer: Self-pay | Admitting: Podiatry

## 2024-04-17 ENCOUNTER — Ambulatory Visit: Admitting: Podiatry

## 2024-04-17 DIAGNOSIS — B351 Tinea unguium: Secondary | ICD-10-CM | POA: Diagnosis not present

## 2024-04-17 DIAGNOSIS — E1142 Type 2 diabetes mellitus with diabetic polyneuropathy: Secondary | ICD-10-CM

## 2024-04-17 DIAGNOSIS — M79674 Pain in right toe(s): Secondary | ICD-10-CM

## 2024-04-17 DIAGNOSIS — M79675 Pain in left toe(s): Secondary | ICD-10-CM

## 2024-04-17 NOTE — Progress Notes (Signed)
 Donnald Tabar                                          MRN: [id number]   04/17/2024   The [facility]BCI Quality Team Specialist reviewed this patient medical record for the purposes of chart review for care gap closure. The following were reviewed: abstraction for care gap closure-kidney health evaluation for diabetes:eGFR  and uACR.    VBCI Quality Team

## 2024-04-17 NOTE — Progress Notes (Signed)
 rescheduled

## 2024-04-17 NOTE — Progress Notes (Signed)
  Subjective:  Patient ID: Zachary Lyons, male    DOB: 1946/03/10,   MRN: [id number]  Chief Complaint  Patient presents with   Diabetes    Do his toenails.  Some of them are thick and we can't cut them.  Saw Zada Palin, NP - 03/24/2024; A1c - 5.9    78 y.o. male presents for concern of thickened elongated and painful nails that are difficult to trim. Requesting to have them trimmed today. Relates burning and tingling in their feet. Patient is diabetic and last A1c was  Lab Results  Component Value Date   HGBA1C 5.9 (A) 03/24/2024   HGBA1C 5.9 03/24/2024   .   PCP:  Palin Zada, NP    . Denies any other pedal complaints. Denies n/v/f/c.   Past Medical History:  Diagnosis Date   Arthritis    osteoarthritis   GERD (gastroesophageal reflux disease)    Hypertension    Nasal polyps    removed   Peptic ulcer    hx. of   Prediabetes 04/20/2014   Chronic, stable   Transfusion history 04/22/2013   15 yrs ago-bleeding ulcer    Objective:  Physical Exam: Vascular: DP/PT pulses 2/4 bilateral. CFT <3 seconds. Feet cold to touch. Absent hair growth on digits. Edema noted to bilateral lower extremities. Xerosis noted bilaterally.  Skin. No lacerations or abrasions bilateral feet. Nails 1-5 bilateral  are thickened discolored and elongated with subungual debris.  Musculoskeletal: MMT 5/5 bilateral lower extremities in DF, PF, Inversion and Eversion. Deceased ROM in DF of ankle joint.  Neurological: Sensation intact to light touch. Protective sensation diminished bilateral.    Assessment:   1. Pain due to onychomycosis of toenails of both feet   2. Type 2 diabetes mellitus with peripheral neuropathy (HCC)      Plan:  Patient was evaluated and treated and all questions answered. -Discussed and educated patient on diabetic foot care, especially with  regards to the vascular, neurological and musculoskeletal systems.  -Stressed the importance of good glycemic control and  the detriment of not  controlling glucose levels in relation to the foot. -Discussed supportive shoes at all times and checking feet regularly.  -Mechanically debrided all nails 1-5 bilateral using sterile nail nipper and filed with dremel without incident  -Answered all patient questions -Patient to return  in 3 months for at risk foot care -Patient advised to call the office if any problems or questions arise in the meantime.   Asberry Failing, DPM

## 2024-05-01 ENCOUNTER — Other Ambulatory Visit: Payer: Self-pay | Admitting: Medical-Surgical

## 2024-05-01 DIAGNOSIS — I10 Essential (primary) hypertension: Secondary | ICD-10-CM

## 2024-05-11 NOTE — Progress Notes (Signed)
 Zachary Lyons                                          MRN: [id number]   05/11/2024   The VBCI Quality Team Specialist reviewed this patient medical record for the purposes of chart review for care gap closure. The following were reviewed: chart review for care gap closure-controlling blood pressure.    VBCI Quality Team

## 2024-05-22 ENCOUNTER — Other Ambulatory Visit: Payer: Self-pay

## 2024-05-22 DIAGNOSIS — K219 Gastro-esophageal reflux disease without esophagitis: Secondary | ICD-10-CM

## 2024-05-22 MED ORDER — HYDROCHLOROTHIAZIDE 25 MG PO TABS: 25.0000 mg | ORAL_TABLET | Freq: Every day | ORAL | 1 refills | Status: AC

## 2024-05-22 MED ORDER — OMEPRAZOLE 20 MG PO CPDR
20.0000 mg | DELAYED_RELEASE_CAPSULE | Freq: Every day | ORAL | 3 refills | Status: AC
Start: 2024-05-22 — End: ?

## 2024-05-22 MED ORDER — DOXAZOSIN MESYLATE 4 MG PO TABS: 4.0000 mg | ORAL_TABLET | Freq: Every evening | ORAL | 1 refills | Status: AC

## 2024-05-22 MED ORDER — AMLODIPINE BESYLATE 10 MG PO TABS: 10.0000 mg | ORAL_TABLET | Freq: Every day | ORAL | 1 refills | Status: AC

## 2024-05-22 MED ORDER — OMEPRAZOLE 20 MG PO CPDR
20.0000 mg | DELAYED_RELEASE_CAPSULE | Freq: Every day | ORAL | 0 refills | Status: DC
Start: 2024-05-22 — End: 2024-05-22

## 2024-05-23 ENCOUNTER — Other Ambulatory Visit: Payer: Self-pay | Admitting: Medical-Surgical

## 2024-05-23 DIAGNOSIS — E118 Type 2 diabetes mellitus with unspecified complications: Secondary | ICD-10-CM

## 2024-05-26 DIAGNOSIS — R413 Other amnesia: Secondary | ICD-10-CM | POA: Diagnosis not present

## 2024-05-26 DIAGNOSIS — R4189 Other symptoms and signs involving cognitive functions and awareness: Secondary | ICD-10-CM | POA: Diagnosis not present

## 2024-06-02 ENCOUNTER — Other Ambulatory Visit: Payer: Self-pay | Admitting: Medical-Surgical

## 2024-06-02 DIAGNOSIS — K219 Gastro-esophageal reflux disease without esophagitis: Secondary | ICD-10-CM

## 2024-06-02 DIAGNOSIS — E785 Hyperlipidemia, unspecified: Secondary | ICD-10-CM

## 2024-07-08 ENCOUNTER — Telehealth: Payer: Self-pay

## 2024-07-08 NOTE — Progress Notes (Signed)
" ° °  07/08/2024  Patient ID: Zachary Lyons, male   DOB: 06-Jan-1946, 78 y.o.   MRN: [id number]  This patient is appearing on a report for being at risk of failing the adherence measure for hypertension (ACEi/ARB) medications this calendar year.   Medication: valsartan  320mg  daily  Last fill date: 03/31/24 for 90 day supply  MyChart message sent to patient.  Zachary Lyons, PharmD, DPLA   "

## 2024-09-24 ENCOUNTER — Ambulatory Visit: Admitting: Podiatry
